# Patient Record
Sex: Female | Born: 1975 | Race: White | Hispanic: No | Marital: Married | Smoking: Never smoker
Health system: Southern US, Community
[De-identification: ages and names within clinical notes are randomized; demographics above are authoritative.]

## PROBLEM LIST (undated history)

## (undated) DIAGNOSIS — I10 Essential (primary) hypertension: Secondary | ICD-10-CM

## (undated) DIAGNOSIS — Z789 Other specified health status: Secondary | ICD-10-CM

## (undated) DIAGNOSIS — G709 Myoneural disorder, unspecified: Secondary | ICD-10-CM

## (undated) DIAGNOSIS — F419 Anxiety disorder, unspecified: Secondary | ICD-10-CM

## (undated) DIAGNOSIS — F4310 Post-traumatic stress disorder, unspecified: Secondary | ICD-10-CM

## (undated) DIAGNOSIS — T7840XA Allergy, unspecified, initial encounter: Secondary | ICD-10-CM

## (undated) DIAGNOSIS — D649 Anemia, unspecified: Secondary | ICD-10-CM

## (undated) DIAGNOSIS — J45909 Unspecified asthma, uncomplicated: Secondary | ICD-10-CM

## (undated) DIAGNOSIS — F32A Depression, unspecified: Secondary | ICD-10-CM

## (undated) DIAGNOSIS — E7800 Pure hypercholesterolemia, unspecified: Secondary | ICD-10-CM

## (undated) DIAGNOSIS — K219 Gastro-esophageal reflux disease without esophagitis: Secondary | ICD-10-CM

## (undated) DIAGNOSIS — M1990 Unspecified osteoarthritis, unspecified site: Secondary | ICD-10-CM

## (undated) DIAGNOSIS — J449 Chronic obstructive pulmonary disease, unspecified: Secondary | ICD-10-CM

## (undated) HISTORY — PX: UPPER GI ENDOSCOPY: SHX6162

## (undated) HISTORY — PX: WISDOM TOOTH EXTRACTION: SHX21

## (undated) HISTORY — DX: Allergy, unspecified, initial encounter: T78.40XA

## (undated) HISTORY — DX: Other specified health status: Z78.9

## (undated) HISTORY — PX: UPPER GASTROINTESTINAL ENDOSCOPY: SHX188

## (undated) HISTORY — DX: Pure hypercholesterolemia, unspecified: E78.00

## (undated) HISTORY — PX: DILATION AND CURETTAGE OF UTERUS: SHX78

## (undated) HISTORY — DX: Post-traumatic stress disorder, unspecified: F43.10

---

## 2016-03-28 DIAGNOSIS — F32A Depression, unspecified: Secondary | ICD-10-CM | POA: Insufficient documentation

## 2016-06-22 LAB — HM PAP SMEAR: HM Pap smear: NEGATIVE

## 2016-10-17 DIAGNOSIS — G43009 Migraine without aura, not intractable, without status migrainosus: Secondary | ICD-10-CM | POA: Insufficient documentation

## 2016-10-17 DIAGNOSIS — I10 Essential (primary) hypertension: Secondary | ICD-10-CM | POA: Insufficient documentation

## 2016-11-28 DIAGNOSIS — M797 Fibromyalgia: Secondary | ICD-10-CM | POA: Insufficient documentation

## 2017-03-19 DIAGNOSIS — K219 Gastro-esophageal reflux disease without esophagitis: Secondary | ICD-10-CM | POA: Insufficient documentation

## 2017-03-19 DIAGNOSIS — K589 Irritable bowel syndrome without diarrhea: Secondary | ICD-10-CM | POA: Insufficient documentation

## 2017-12-03 DIAGNOSIS — R7303 Prediabetes: Secondary | ICD-10-CM | POA: Insufficient documentation

## 2018-04-17 DIAGNOSIS — M1712 Unilateral primary osteoarthritis, left knee: Secondary | ICD-10-CM | POA: Insufficient documentation

## 2018-06-28 DIAGNOSIS — F411 Generalized anxiety disorder: Secondary | ICD-10-CM | POA: Insufficient documentation

## 2018-11-28 DIAGNOSIS — N959 Unspecified menopausal and perimenopausal disorder: Secondary | ICD-10-CM | POA: Insufficient documentation

## 2019-10-10 DIAGNOSIS — M1990 Unspecified osteoarthritis, unspecified site: Secondary | ICD-10-CM | POA: Insufficient documentation

## 2019-10-10 DIAGNOSIS — M0600 Rheumatoid arthritis without rheumatoid factor, unspecified site: Secondary | ICD-10-CM | POA: Insufficient documentation

## 2020-07-22 ENCOUNTER — Ambulatory Visit: Payer: Self-pay | Admitting: Orthopaedic Surgery

## 2020-07-23 ENCOUNTER — Encounter: Payer: Self-pay | Admitting: Surgical

## 2020-07-23 ENCOUNTER — Ambulatory Visit (INDEPENDENT_AMBULATORY_CARE_PROVIDER_SITE_OTHER): Payer: 59

## 2020-07-23 ENCOUNTER — Ambulatory Visit (INDEPENDENT_AMBULATORY_CARE_PROVIDER_SITE_OTHER): Payer: 59 | Admitting: Surgical

## 2020-07-23 ENCOUNTER — Other Ambulatory Visit: Payer: Self-pay

## 2020-07-23 VITALS — Ht 66.7 in | Wt 223.0 lb

## 2020-07-23 DIAGNOSIS — G8929 Other chronic pain: Secondary | ICD-10-CM

## 2020-07-23 DIAGNOSIS — M25562 Pain in left knee: Secondary | ICD-10-CM | POA: Diagnosis not present

## 2020-07-23 DIAGNOSIS — M1712 Unilateral primary osteoarthritis, left knee: Secondary | ICD-10-CM

## 2020-07-24 LAB — PREALBUMIN: Prealbumin: 29 mg/dL (ref 17–34)

## 2020-07-24 LAB — VITAMIN D 25 HYDROXY (VIT D DEFICIENCY, FRACTURES): Vit D, 25-Hydroxy: 34 ng/mL (ref 30–100)

## 2020-07-25 ENCOUNTER — Encounter: Payer: Self-pay | Admitting: Surgical

## 2020-07-25 NOTE — Progress Notes (Signed)
Office Visit Note   Patient: Kristi Decker           Date of Birth: 1976/05/01           MRN: [id number] Visit Date: 07/23/2020 Requested by: No referring provider defined for this encounter. PCP: No primary care provider on file.  Subjective: Chief Complaint  Patient presents with  . Left Knee - Pain    HPI: Kristi Decker is a 45 y.o. female who presents to the office complaining of left knee pain.  Patient has history of left knee pain over the last 3 years.  She has severe pain that is life altering.  She is very tearful regarding her struggles with knee pain in the past at today's visit.  She has tried cortisone injections which initially lasted about 3 months with 100% relief but subsequent injections have provided no lasting benefit.  She has pain that wakes her up at night and limits her walking endurance.  She has gotten to the point where she discussed knee replacement with an orthopedic surgeon at [facility] and had a date for surgery before she was forced to cancel due to change in insurance.  She is here today to discuss left knee replacement.  She has failed physical therapy with no improvement of her symptoms significantly.  No history of gel injections in the past.  She does report that a brace helps somewhat with pain when she is ambulatory.  She denies any groin pain, radicular pain from her low back, numbness/tingling throughout the left leg.  She wakes with pain throughout the night multiple times most nights.  She does have a history of rheumatoid arthritis for which she takes sulfasalazine and diclofenac.  Also reports history of asthma, degenerative disc disease of the lumbar spine, fibromyalgia.  Denies any history of cardiac disease and does not have a cardiologist.  Denies any personal history of blood clots but she does note that her aunt had a blood clot in her 80s.  She does not take any blood thinners.  Denies any history of diabetes.  BMI is about 35.  She lives at home  with her husband and her 34 year old son who will be available to help her.  She has about 2-4 stairs to enter her home based on the entrance.  Everything is on 1 level for the most part..                ROS: All systems reviewed are negative as they relate to the chief complaint within the history of present illness.  Patient denies fevers or chills.  Assessment & Plan: Visit Diagnoses:  1. Unilateral primary osteoarthritis, left knee   2. Chronic pain of left knee     Plan: Patient is a 45 year old female who presents complaining of severe left knee pain.  This left knee pain is life altering. She is young for left total knee arthroplasty at 45 years old but she does have severe left knee osteoarthritis on today's radiographs that show tricompartmental degenerative changes that are worst in the medial compartment with bone-on-bone arthritis along with sclerosis and osteophyte formation.  She has exhausted conservative measures and would like to proceed with surgical intervention.  Discussed the risks and benefits of total knee arthroplasty in great depth with the use of a model.  She understands this is a very painful procedure which requires significant amount of rehabilitation after the surgery.  She does have good help at home and good social support which  bodes well for her.  Risks of the procedure were discussed including risk of medical incident, DVT/PE, knee stiffness, knee instability, nerve/blood vessel damage, prosthetic joint infection, need for revision surgery.  Despite the risks she would like to proceed with surgery.  Plan to post patient for left total knee arthroplasty.  She will follow-up with Dr. August Saucer next week to meet him prior to surgery and ensure there are no other concerns to address prior to her procedure.  Ordered prealbumin and vitamin D to ensure no supplementation is needed.  These labs were found to be normal.  Follow-Up Instructions: No follow-ups on file.   Orders:   Orders Placed This Encounter  Procedures  . XR KNEE 3 VIEW LEFT  . Vitamin D (25 hydroxy)  . Prealbumin   No orders of the defined types were placed in this encounter.     Procedures: No procedures performed   Clinical Data: No additional findings.  Objective: Vital Signs: Ht 5' 6.7" (1.694 m)   Wt 223 lb (101.2 kg)   BMI 35.24 kg/m   Physical Exam:  Constitutional: Patient appears well-developed HEENT:  Head: Normocephalic Eyes:EOM are normal Neck: Normal range of motion Cardiovascular: Normal rate Pulmonary/chest: Effort normal Neurologic: Patient is alert Skin: Skin is warm Psychiatric: Patient has normal mood and affect  Ortho Exam: Ortho exam demonstrates tenderness over the quad tendon, medial joint line, lateral joint line.  No tenderness over the patella or patellar tendon.  Able to perform straight leg raise.  No pain with hip range of motion.  Palpable pedal pulses.  No calf tenderness.  Negative Homans' sign.  No significant lower extremity edema.  Extension to <5 degrees and flexion to 110 degrees.  No instability to varus/valgus stress.  No effusion noted.  Specialty Comments:  No specialty comments available.  Imaging: No results found.   PMFS History: There are no problems to display for this patient.  History reviewed. No pertinent past medical history.  History reviewed. No pertinent family history.  History reviewed. No pertinent surgical history. Social History   Occupational History  . Not on file  Tobacco Use  . Smoking status: Never Smoker  . Smokeless tobacco: Never Used  Substance and Sexual Activity  . Alcohol use: Not on file  . Drug use: Not on file  . Sexual activity: Not on file

## 2020-07-29 ENCOUNTER — Other Ambulatory Visit: Payer: Self-pay

## 2020-07-29 ENCOUNTER — Ambulatory Visit (INDEPENDENT_AMBULATORY_CARE_PROVIDER_SITE_OTHER): Payer: 59 | Admitting: Orthopedic Surgery

## 2020-07-29 ENCOUNTER — Encounter: Payer: Self-pay | Admitting: Orthopedic Surgery

## 2020-07-29 DIAGNOSIS — M1712 Unilateral primary osteoarthritis, left knee: Secondary | ICD-10-CM

## 2020-07-29 NOTE — Progress Notes (Signed)
Office Visit Note   Patient: Kristi Decker           Date of Birth: 05-15-1976           MRN: [id number] Visit Date: 07/29/2020 Requested by: No referring provider defined for this encounter. PCP: No primary care provider on file.  Subjective: Chief Complaint  Patient presents with  . Left Knee - Follow-up    HPI: Kristi Decker is a patient with left knee arthritis symptoms and essentially some knee pain prior to surgery scheduled for 08/17/2020.  She has left knee pain and has been having it for many years.  The pain is becoming intolerable.  She has night pain rest pain pain which interferes with her activities of daily living.  She tried multiple interventions.  None of them effective.  Was scheduled for total knee replacement in [place] but presents now for further management because [place] the city.  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  No personal or family history of DVT or pulmonary embolism              ROS: All systems reviewed are negative as they relate to the chief complaint within the history of present illness.  Patient denies  fevers or chills.   Assessment & Plan: Visit Diagnoses:  1. Unilateral primary osteoarthritis, left knee     Plan: Impression is left knee arthritis end-stage in a 45 year old patient with debilitating symptoms and failure of conservative management.  Plan is left total knee replacement.  Plan press-fit prosthesis if possible.  Risk benefits are discussed include not limited to infection nerve vessel damage knee stiffness as well as potential need for revision in her lifetime.  All questions answered essentially her visit today was more of a meet and greet.  She was posted last visit and therefore the visit today is no charge.  Follow-Up Instructions: No follow-ups on file.   Orders:  No orders of the defined types were placed in this encounter.  No orders of the defined types were placed in this  encounter.     Procedures: No procedures performed   Clinical Data: No additional findings.  Objective: Vital Signs: There were no vitals taken for this visit.  Physical Exam:   Constitutional: Patient appears well-developed HEENT:  Head: Normocephalic Eyes:EOM are normal Neck: Normal range of motion Cardiovascular: Normal rate Pulmonary/chest: Effort normal Neurologic: Patient is alert Skin: Skin is warm Psychiatric: Patient has normal mood and affect    Ortho Exam: Ortho exam demonstrates palpable pedal pulses on the left.  Range of motion is full extension to about 100 of flexion.  Collaterals and cruciate ligaments are stable.  Skin is intact.  Extensor mechanism is intact.  No effusion present.  Global knee pain is present.  I would also like her to stop her Voltaren and sulfasalazine 1 week prior to surgery.  Specialty Comments:  No specialty comments available.  Imaging: No results found.   PMFS History: There are no problems to display for this patient.  History reviewed. No pertinent past medical history.  History reviewed. No pertinent family history.  History reviewed. No pertinent surgical history. Social History   Occupational History  . Not on file  Tobacco Use  . Smoking status: Never Smoker  . Smokeless tobacco: Never Used  Substance and Sexual Activity  . Alcohol use: Not on file  . Drug use: Not on file  . Sexual activity:  Not on file

## 2020-07-30 ENCOUNTER — Telehealth: Payer: Self-pay

## 2020-07-30 ENCOUNTER — Telehealth: Payer: Self-pay | Admitting: Orthopedic Surgery

## 2020-07-30 NOTE — Telephone Encounter (Signed)
Can you advise? 

## 2020-07-30 NOTE — Telephone Encounter (Signed)
Pt wants to know if you have any recommendations for menopausal symptoms. She has cold sweats, then hot flashes, she has not had a period in over a year. Her symotoms started 1 week ago. She feels like someone has lit a fire inside her body for about 20-30 seconds or longer sometimes. She has a new patient appointment scheduled 4/22.

## 2020-07-30 NOTE — Telephone Encounter (Signed)
Patient informed and advised to speak to her obgyn.

## 2020-07-30 NOTE — Telephone Encounter (Signed)
Pt called and she can't remember what medications Dr.Dean told her to stop taking so can you call her or message her through MyChart and let her know which ones it was and also how long before surgery should she stop taking them?

## 2020-07-30 NOTE — Telephone Encounter (Signed)
Unfortunately I will not be able to advise her until her new patient appointment.

## 2020-07-31 ENCOUNTER — Encounter: Payer: Self-pay | Admitting: Orthopedic Surgery

## 2020-08-01 ENCOUNTER — Encounter: Payer: Self-pay | Admitting: Orthopedic Surgery

## 2020-08-01 NOTE — Telephone Encounter (Signed)
Voltaren and sulfasalazine

## 2020-08-02 NOTE — Telephone Encounter (Signed)
10 days thx

## 2020-08-02 NOTE — Telephone Encounter (Signed)
Got it thx

## 2020-08-02 NOTE — Telephone Encounter (Signed)
thanks

## 2020-08-03 NOTE — Telephone Encounter (Signed)
I would stop diclofenac one week prior to surgery and stop sulfa now

## 2020-08-05 ENCOUNTER — Other Ambulatory Visit: Payer: Self-pay

## 2020-08-12 NOTE — Progress Notes (Signed)
Surgical Instructions    Your procedure is scheduled on Tuesday, April 5th, 2022  Report to [facility] Main Entrance "A" at 09:37 A.M., then check in with the Admitting office.  Call this number if you have problems the morning of surgery:  [phone]   If you have any questions prior to your surgery date call [phone]: Open Monday-Friday 8am-4pm    Remember:  Do not eat after midnight the night before your surgery  You may drink clear liquids until 08:37 A.M. the morning of your surgery.   Clear liquids allowed are: Water, Non-Citrus Juices (without pulp), Carbonated Beverages, Clear Tea, Black Coffee Only, and Gatorade   Patient Instructions  . The night before surgery:  o No food after midnight. ONLY clear liquids after midnight  . The day of surgery (if you do NOT have diabetes):  o Drink ONE (1) Pre-Surgery Clear Ensure by 08:37A.M. the morning of surgery. Drink in one sitting. Do not sip.  o This drink was given to you during your hospital  pre-op appointment visit.  o Nothing else to drink after completing the  Pre-Surgery Clear Ensure.         If you have questions, please contact your surgeon's office.   Take these medicines the morning of surgery with A SIP OF WATER   DULoxetine (CYMBALTA) albuterol (PROVENTIL)  - please, bring the inhaler with you pantoprazole (PROTONIX) fenofibrate (TRICOR) QUEtiapine (SEROQUEL)  As of today, STOP taking any Aspirin (unless otherwise instructed by your surgeon) Aleve, Naproxen, Ibuprofen, Motrin, Advil, Goody's, BC's, all herbal medications, fish oil, and all vitamins.                     Do not wear jewelry, make up, or nail polish            Do not wear lotions, powders, perfumes, or deodorant.            Do not shave 48 hours prior to surgery.             Do not bring valuables to the hospital.            [facility] is not responsible for any belongings or valuables.  Do NOT Smoke (Tobacco/Vaping) or drink Alcohol  24 hours prior to your procedure If you use a CPAP at night, you may bring all equipment for your overnight stay.   Contacts, glasses, dentures or bridgework may not be worn into surgery, please bring cases for these belongings   For patients admitted to the hospital, discharge time will be determined by your treatment team.   Patients discharged the day of surgery will not be allowed to drive home, and someone needs to stay with them for 24 hours.    Special instructions:   [facility]- Preparing For Surgery  Before surgery, you can play an important role. Because skin is not sterile, your skin needs to be as free of germs as possible. You can reduce the number of germs on your skin by washing with CHG (chlorahexidine gluconate) Soap before surgery.  CHG is an antiseptic cleaner which kills germs and bonds with the skin to continue killing germs even after washing.    Oral Hygiene is also important to reduce your risk of infection.  Remember - BRUSH YOUR TEETH THE MORNING OF SURGERY WITH YOUR REGULAR TOOTHPASTE  Please do not use if you have an allergy to CHG or antibacterial soaps. If your skin becomes reddened/irritated stop using  the CHG.  Do not shave (including legs and underarms) for at least 48 hours prior to first CHG shower. It is OK to shave your face.  Please follow these instructions carefully.   1. Shower the NIGHT BEFORE SURGERY and the MORNING OF SURGERY  2. If you chose to wash your hair, wash your hair first as usual with your normal shampoo.  3. After you shampoo, rinse your hair and body thoroughly to remove the shampoo.   4. Use CHG Soap as you would any other liquid soap. You can apply CHG directly to the skin and wash gently with a scrungie or a clean washcloth.   5. Apply the CHG Soap to your body ONLY FROM THE NECK DOWN.  Do not use on open wounds or open sores. Avoid contact with your eyes, ears, mouth and genitals (private parts). Wash Face and genitals  (private parts)  with your normal soap.   6. Wash thoroughly, paying special attention to the area where your surgery will be performed.  7. Thoroughly rinse your body with warm water from the neck down.  8. DO NOT shower/wash with your normal soap after using and rinsing off the CHG Soap.  9. Pat yourself dry with a CLEAN TOWEL.  10. Wear CLEAN PAJAMAS to bed the night before surgery  11. Place CLEAN SHEETS on your bed the night before your surgery  12. DO NOT SLEEP WITH PETS.   Day of Surgery: Shower with CHG soap. Wear Clean/Comfortable clothing the morning of surgery Do not apply any deodorants/lotions.   Remember to brush your teeth WITH YOUR REGULAR TOOTHPASTE.   Please read over the following fact sheets that you were given.

## 2020-08-13 ENCOUNTER — Other Ambulatory Visit: Payer: Self-pay

## 2020-08-13 ENCOUNTER — Other Ambulatory Visit (HOSPITAL_COMMUNITY): Payer: 59

## 2020-08-13 ENCOUNTER — Encounter (HOSPITAL_COMMUNITY): Payer: Self-pay

## 2020-08-13 ENCOUNTER — Encounter (HOSPITAL_COMMUNITY)
Admission: RE | Admit: 2020-08-13 | Discharge: 2020-08-13 | Disposition: A | Discharge status: Home / Self Care | Payer: 59 | Source: Ambulatory Visit | Attending: Orthopedic Surgery | Admitting: Orthopedic Surgery

## 2020-08-13 DIAGNOSIS — Z20822 Contact with and (suspected) exposure to covid-19: Secondary | ICD-10-CM | POA: Insufficient documentation

## 2020-08-13 DIAGNOSIS — Z01812 Encounter for preprocedural laboratory examination: Secondary | ICD-10-CM | POA: Insufficient documentation

## 2020-08-13 HISTORY — DX: Unspecified asthma, uncomplicated: J45.909

## 2020-08-13 HISTORY — DX: Anemia, unspecified: D64.9

## 2020-08-13 HISTORY — DX: Myoneural disorder, unspecified: G70.9

## 2020-08-13 HISTORY — DX: Chronic obstructive pulmonary disease, unspecified: J44.9

## 2020-08-13 HISTORY — DX: Anxiety disorder, unspecified: F41.9

## 2020-08-13 HISTORY — DX: Gastro-esophageal reflux disease without esophagitis: K21.9

## 2020-08-13 HISTORY — DX: Essential (primary) hypertension: I10

## 2020-08-13 HISTORY — DX: Depression, unspecified: F32.A

## 2020-08-13 HISTORY — DX: Unspecified osteoarthritis, unspecified site: M19.90

## 2020-08-13 LAB — CBC
HCT: 42 % (ref 36.0–46.0)
Hemoglobin: 13.7 g/dL (ref 12.0–15.0)
MCH: 30 pg (ref 26.0–34.0)
MCHC: 32.6 g/dL (ref 30.0–36.0)
MCV: 92.1 fL (ref 80.0–100.0)
Platelets: 467 10*3/uL — ABNORMAL HIGH (ref 150–400)
RBC: 4.56 MIL/uL (ref 3.87–5.11)
RDW: 13.1 % (ref 11.5–15.5)
WBC: 8.5 10*3/uL (ref 4.0–10.5)
nRBC: 0 % (ref 0.0–0.2)

## 2020-08-13 LAB — URINALYSIS, ROUTINE W REFLEX MICROSCOPIC
Bilirubin Urine: NEGATIVE
Glucose, UA: NEGATIVE mg/dL
Hgb urine dipstick: NEGATIVE
Ketones, ur: NEGATIVE mg/dL
Leukocytes,Ua: NEGATIVE
Nitrite: NEGATIVE
Protein, ur: NEGATIVE mg/dL
Specific Gravity, Urine: 1.031 — ABNORMAL HIGH (ref 1.005–1.030)
pH: 5 (ref 5.0–8.0)

## 2020-08-13 LAB — BASIC METABOLIC PANEL
Anion gap: 9 (ref 5–15)
BUN: 18 mg/dL (ref 6–20)
CO2: 27 mmol/L (ref 22–32)
Calcium: 10.3 mg/dL (ref 8.9–10.3)
Chloride: 101 mmol/L (ref 98–111)
Creatinine, Ser: 0.64 mg/dL (ref 0.44–1.00)
GFR, Estimated: >60 mL/min (ref >60)
Glucose, Bld: 106 mg/dL — ABNORMAL HIGH (ref 70–99)
Potassium: 3.4 mmol/L — ABNORMAL LOW (ref 3.5–5.1)
Sodium: 137 mmol/L (ref 135–145)

## 2020-08-13 LAB — SURGICAL PCR SCREEN
MRSA, PCR: NEGATIVE
Staphylococcus aureus: NEGATIVE

## 2020-08-13 LAB — SARS CORONAVIRUS 2 (TAT 6-24 HRS): SARS Coronavirus 2: NEGATIVE

## 2020-08-13 LAB — NO BLOOD PRODUCTS

## 2020-08-13 NOTE — Progress Notes (Addendum)
Blood refusal sheet faxed to blood bank and added to chart in FYI section. Office notified as well.

## 2020-08-13 NOTE — Progress Notes (Signed)
PCP - Hetty Blend, NP-C (has not seen yet) Cardiologist - denies  **records requested from [facility])  PPM/ICD - denies  Chest x-ray - n/a EKG - 02/04/20 (records requested) Stress Test - denies ECHO - denies Cardiac Cath - denies  Sleep Study - denies  Patient instructed to hold all Aspirin, NSAID's, herbal medications, fish oil and vitamins 7 days prior to surgery.   ERAS Protcol -yes PRE-SURGERY Ensure or G2- ensure give  COVID TEST- 08/13/20   Anesthesia review: yes  Patient denies shortness of breath, fever, cough and chest pain at PAT appointment   All instructions explained to the patient, with a verbal understanding of the material. Patient agrees to go over the instructions while at home for a better understanding. Patient also instructed to self quarantine after being tested for COVID-19. The opportunity to ask questions was provided.

## 2020-08-13 NOTE — Progress Notes (Addendum)
Surgical Instructions    Your procedure is scheduled on Tuesday, April 5th, 2022  Report to [facility] Main Entrance "A" at 09:30 A.M., then check in with the Admitting office.  Call this number if you have problems the morning of surgery:  [phone]   If you have any questions prior to your surgery date call [phone]: Open Monday-Friday 8am-4pm    Remember:  Do not eat after midnight the night before your surgery  You may drink clear liquids until 08:30 A.M. the morning of your surgery.   Clear liquids allowed are: Water, Non-Citrus Juices (without pulp), Carbonated Beverages, Clear Tea, Black Coffee Only, and Gatorade   Patient Instructions  . The night before surgery:  o No food after midnight. ONLY clear liquids after midnight  . The day of surgery (if you do NOT have diabetes):  o Drink ONE (1) Pre-Surgery Clear Ensure by 08:30A.M. the morning of surgery. Drink in one sitting. Do not sip.  o This drink was given to you during your hospital  pre-op appointment visit. o Nothing else to drink after completing the  Pre-Surgery Clear Ensure.         If you have questions, please contact your surgeon's office.   Take these medicines the morning of surgery with A SIP OF WATER  clonazePAM (KLONOPIN) if needed DULoxetine (CYMBALTA) albuterol (PROVENTIL)  - please, bring the inhaler with you pantoprazole (PROTONIX) lamoTRIgine (LAMICTAL)   As of today, STOP taking any Aspirin (unless otherwise instructed by your surgeon) Aleve, Naproxen, Ibuprofen, Motrin, Advil, Goody's, BC's, all herbal medications, fish oil, and all vitamins. Also, stop taking diclofenac (VOLTAREN) and sulfaSALAzine (AZULFIDINE).                     Do not wear jewelry, make up, or nail polish            Do not wear lotions, powders, perfumes, or deodorant.            Do not shave 48 hours prior to surgery.             Do not bring valuables to the hospital.            [facility] is not responsible  for any belongings or valuables.  Do NOT Smoke (Tobacco/Vaping) or drink Alcohol 24 hours prior to your procedure If you use a CPAP at night, you may bring all equipment for your overnight stay.   Contacts, glasses, dentures or bridgework may not be worn into surgery, please bring cases for these belongings   For patients admitted to the hospital, discharge time will be determined by your treatment team.   Patients discharged the day of surgery will not be allowed to drive home, and someone needs to stay with them for 24 hours.    Special instructions:   [facility]- Preparing For Surgery  Before surgery, you can play an important role. Because skin is not sterile, your skin needs to be as free of germs as possible. You can reduce the number of germs on your skin by washing with CHG (chlorahexidine gluconate) Soap before surgery.  CHG is an antiseptic cleaner which kills germs and bonds with the skin to continue killing germs even after washing.    Oral Hygiene is also important to reduce your risk of infection.  Remember - BRUSH YOUR TEETH THE MORNING OF SURGERY WITH YOUR REGULAR TOOTHPASTE  Please do not use if you have an allergy to CHG or  antibacterial soaps. If your skin becomes reddened/irritated stop using the CHG.  Do not shave (including legs and underarms) for at least 48 hours prior to first CHG shower. It is OK to shave your face.  Please follow these instructions carefully.   1. Shower the NIGHT BEFORE SURGERY and the MORNING OF SURGERY  2. If you chose to wash your hair, wash your hair first as usual with your normal shampoo.  3. After you shampoo, rinse your hair and body thoroughly to remove the shampoo.   4. Use CHG Soap as you would any other liquid soap. You can apply CHG directly to the skin and wash gently with a scrungie or a clean washcloth.   5. Apply the CHG Soap to your body ONLY FROM THE NECK DOWN.  Do not use on open wounds or open sores. Avoid contact  with your eyes, ears, mouth and genitals (private parts). Wash Face and genitals (private parts)  with your normal soap.   6. Wash thoroughly, paying special attention to the area where your surgery will be performed.  7. Thoroughly rinse your body with warm water from the neck down.  8. DO NOT shower/wash with your normal soap after using and rinsing off the CHG Soap.  9. Pat yourself dry with a CLEAN TOWEL.  10. Wear CLEAN PAJAMAS to bed the night before surgery  11. Place CLEAN SHEETS on your bed the night before your surgery  12. DO NOT SLEEP WITH PETS.   Day of Surgery: Shower with CHG soap. Wear Clean/Comfortable clothing the morning of surgery Do not apply any deodorants/lotions.   Remember to brush your teeth WITH YOUR REGULAR TOOTHPASTE.   Please read over the following fact sheets that you were given.

## 2020-08-16 ENCOUNTER — Telehealth: Payer: Self-pay | Admitting: Orthopedic Surgery

## 2020-08-16 ENCOUNTER — Encounter: Payer: Self-pay | Admitting: Orthopedic Surgery

## 2020-08-16 LAB — URINE CULTURE

## 2020-08-16 MED ORDER — TRANEXAMIC ACID 1000 MG/10ML IV SOLN
2000.0000 mg | INTRAVENOUS | Status: AC
  Administered 2020-08-17: 2000 mg via TOPICAL
  Filled 2020-08-16: qty 20

## 2020-08-16 NOTE — Telephone Encounter (Signed)
FYI

## 2020-08-16 NOTE — Telephone Encounter (Signed)
Sierra with [facility] pre-admissions left a voicemail message on Friday April 1st at 3:23 wanting to notify you of patient's BLOOD PRODUCTS REFUSAL.  Patient is scheduled for left total knee on 08-17-20 at [facility] with Dr. August Saucer.  FYI has been put in the chart.

## 2020-08-17 ENCOUNTER — Observation Stay (HOSPITAL_COMMUNITY)
Admission: RE | Admit: 2020-08-17 | Discharge: 2020-08-19 | Disposition: A | Discharge status: Home / Self Care | Payer: 59 | Attending: Orthopedic Surgery | Admitting: Orthopedic Surgery

## 2020-08-17 ENCOUNTER — Ambulatory Visit (HOSPITAL_COMMUNITY): Payer: 59 | Admitting: Anesthesiology

## 2020-08-17 ENCOUNTER — Other Ambulatory Visit: Payer: Self-pay

## 2020-08-17 ENCOUNTER — Encounter (HOSPITAL_COMMUNITY)
Admission: RE | Disposition: A | Discharge status: Home / Self Care | Payer: Self-pay | Source: Home / Self Care | Attending: Orthopedic Surgery

## 2020-08-17 ENCOUNTER — Telehealth: Payer: Self-pay | Admitting: Emergency Medicine

## 2020-08-17 ENCOUNTER — Ambulatory Visit (HOSPITAL_COMMUNITY): Payer: 59 | Admitting: Vascular Surgery

## 2020-08-17 ENCOUNTER — Encounter (HOSPITAL_COMMUNITY): Payer: Self-pay | Admitting: Orthopedic Surgery

## 2020-08-17 DIAGNOSIS — M1712 Unilateral primary osteoarthritis, left knee: Principal | ICD-10-CM | POA: Insufficient documentation

## 2020-08-17 DIAGNOSIS — Z79899 Other long term (current) drug therapy: Secondary | ICD-10-CM | POA: Diagnosis not present

## 2020-08-17 DIAGNOSIS — I10 Essential (primary) hypertension: Secondary | ICD-10-CM | POA: Insufficient documentation

## 2020-08-17 DIAGNOSIS — J449 Chronic obstructive pulmonary disease, unspecified: Secondary | ICD-10-CM | POA: Insufficient documentation

## 2020-08-17 DIAGNOSIS — Z96652 Presence of left artificial knee joint: Secondary | ICD-10-CM

## 2020-08-17 DIAGNOSIS — J45909 Unspecified asthma, uncomplicated: Secondary | ICD-10-CM | POA: Diagnosis not present

## 2020-08-17 HISTORY — PX: TOTAL KNEE ARTHROPLASTY: SHX125

## 2020-08-17 SURGERY — ARTHROPLASTY, KNEE, TOTAL
Anesthesia: Monitor Anesthesia Care | Site: Knee | Laterality: Left

## 2020-08-17 MED ORDER — ONDANSETRON HCL 4 MG PO TABS: 4.0000 mg | ORAL_TABLET | Freq: Four times a day (QID) | ORAL | Status: DC | PRN

## 2020-08-17 MED ORDER — METHOCARBAMOL 1000 MG/10ML IJ SOLN
500.0000 mg | Freq: Four times a day (QID) | INTRAVENOUS | Status: DC | PRN
  Administered 2020-08-18: 500 mg via INTRAVENOUS
  Filled 2020-08-17 (×2): qty 5

## 2020-08-17 MED ORDER — TRANEXAMIC ACID-NACL 1000-0.7 MG/100ML-% IV SOLN
1000.0000 mg | INTRAVENOUS | Status: AC
  Administered 2020-08-17: 1000 mg via INTRAVENOUS
  Filled 2020-08-17: qty 100

## 2020-08-17 MED ORDER — DEXAMETHASONE SODIUM PHOSPHATE 10 MG/ML IJ SOLN
INTRAMUSCULAR | Status: DC | PRN
  Administered 2020-08-17: 10 mg

## 2020-08-17 MED ORDER — FENTANYL CITRATE (PF) 100 MCG/2ML IJ SOLN: 25.0000 ug | INTRAMUSCULAR | Status: DC | PRN

## 2020-08-17 MED ORDER — ROPIVACAINE HCL 7.5 MG/ML IJ SOLN
INTRAMUSCULAR | Status: DC | PRN
  Administered 2020-08-17: 25 mL via PERINEURAL

## 2020-08-17 MED ORDER — ALBUTEROL SULFATE HFA 108 (90 BASE) MCG/ACT IN AERS
2.0000 | INHALATION_SPRAY | Freq: Four times a day (QID) | RESPIRATORY_TRACT | Status: DC | PRN
  Filled 2020-08-17: qty 6.7

## 2020-08-17 MED ORDER — OXYCODONE HCL 5 MG PO TABS
5.0000 mg | ORAL_TABLET | Freq: Once | ORAL | Status: DC | PRN
Start: 2020-08-17 — End: 2020-08-17

## 2020-08-17 MED ORDER — POVIDONE-IODINE 10 % EX SWAB: 2.0000 "application " | Freq: Once | CUTANEOUS | Status: DC

## 2020-08-17 MED ORDER — FENTANYL CITRATE (PF) 100 MCG/2ML IJ SOLN
INTRAMUSCULAR | Status: DC | PRN
  Administered 2020-08-17: 50 ug via INTRAVENOUS

## 2020-08-17 MED ORDER — ACETAMINOPHEN 160 MG/5ML PO SOLN: 325.0000 mg | ORAL | Status: DC | PRN

## 2020-08-17 MED ORDER — LIDOCAINE 2% (20 MG/ML) 5 ML SYRINGE
INTRAMUSCULAR | Status: AC
  Filled 2020-08-17: qty 5

## 2020-08-17 MED ORDER — LISINOPRIL 20 MG PO TABS
20.0000 mg | ORAL_TABLET | Freq: Every day | ORAL | Status: DC
  Administered 2020-08-17 – 2020-08-19 (×3): 20 mg via ORAL
  Filled 2020-08-17 (×3): qty 1

## 2020-08-17 MED ORDER — VANCOMYCIN HCL 1000 MG IV SOLR
INTRAVENOUS | Status: DC | PRN
  Administered 2020-08-17: 1000 mg via TOPICAL

## 2020-08-17 MED ORDER — DIPHENHYDRAMINE HCL 50 MG/ML IJ SOLN
INTRAMUSCULAR | Status: AC
  Filled 2020-08-17: qty 1

## 2020-08-17 MED ORDER — ONDANSETRON HCL 4 MG/2ML IJ SOLN: 4.0000 mg | Freq: Once | INTRAMUSCULAR | Status: DC | PRN

## 2020-08-17 MED ORDER — MORPHINE SULFATE (PF) 4 MG/ML IV SOLN
INTRAVENOUS | Status: AC
  Filled 2020-08-17: qty 2

## 2020-08-17 MED ORDER — CHLORHEXIDINE GLUCONATE 0.12 % MT SOLN
15.0000 mL | Freq: Once | OROMUCOSAL | Status: AC
  Administered 2020-08-17: 15 mL via OROMUCOSAL
  Filled 2020-08-17: qty 15

## 2020-08-17 MED ORDER — PROPOFOL 10 MG/ML IV BOLUS
INTRAVENOUS | Status: DC | PRN
  Administered 2020-08-17 (×3): 50 mg via INTRAVENOUS

## 2020-08-17 MED ORDER — FENTANYL CITRATE (PF) 250 MCG/5ML IJ SOLN
INTRAMUSCULAR | Status: AC
  Filled 2020-08-17: qty 5

## 2020-08-17 MED ORDER — MIDAZOLAM HCL 5 MG/5ML IJ SOLN
INTRAMUSCULAR | Status: DC | PRN
  Administered 2020-08-17: 2 mg via INTRAVENOUS

## 2020-08-17 MED ORDER — DIPHENHYDRAMINE HCL 50 MG/ML IJ SOLN
INTRAMUSCULAR | Status: DC | PRN
  Administered 2020-08-17: 12.5 mg via INTRAVENOUS

## 2020-08-17 MED ORDER — BUPIVACAINE HCL 0.25 % IJ SOLN
INTRAMUSCULAR | Status: DC | PRN
  Administered 2020-08-17: 10 mL
  Administered 2020-08-17: 20 mL

## 2020-08-17 MED ORDER — ACETAMINOPHEN 325 MG PO TABS
325.0000 mg | ORAL_TABLET | Freq: Four times a day (QID) | ORAL | Status: DC | PRN
  Administered 2020-08-17: 650 mg via ORAL
  Filled 2020-08-17: qty 2

## 2020-08-17 MED ORDER — ORAL CARE MOUTH RINSE: 15.0000 mL | Freq: Once | OROMUCOSAL | Status: AC

## 2020-08-17 MED ORDER — SODIUM CHLORIDE 0.9% FLUSH
INTRAVENOUS | Status: DC | PRN
  Administered 2020-08-17: 20 mL

## 2020-08-17 MED ORDER — BUPIVACAINE HCL (PF) 0.25 % IJ SOLN
INTRAMUSCULAR | Status: AC
  Filled 2020-08-17: qty 30

## 2020-08-17 MED ORDER — MOMETASONE FURO-FORMOTEROL FUM 200-5 MCG/ACT IN AERO
2.0000 | INHALATION_SPRAY | Freq: Two times a day (BID) | RESPIRATORY_TRACT | Status: DC
  Administered 2020-08-17 – 2020-08-19 (×4): 2 via RESPIRATORY_TRACT
  Filled 2020-08-17: qty 8.8

## 2020-08-17 MED ORDER — OXYCODONE HCL 5 MG PO TABS
5.0000 mg | ORAL_TABLET | ORAL | Status: DC | PRN
  Administered 2020-08-17 – 2020-08-18 (×4): 10 mg via ORAL
  Administered 2020-08-18: 5 mg via ORAL
  Administered 2020-08-19 (×2): 10 mg via ORAL
  Filled 2020-08-17 (×4): qty 2
  Filled 2020-08-17: qty 1
  Filled 2020-08-17 (×2): qty 2

## 2020-08-17 MED ORDER — CEFAZOLIN SODIUM-DEXTROSE 2-4 GM/100ML-% IV SOLN
2.0000 g | INTRAVENOUS | Status: AC
  Administered 2020-08-17: 2 g via INTRAVENOUS
  Filled 2020-08-17: qty 100

## 2020-08-17 MED ORDER — DEXAMETHASONE SODIUM PHOSPHATE 10 MG/ML IJ SOLN
INTRAMUSCULAR | Status: DC | PRN
  Administered 2020-08-17: 5 mg via INTRAVENOUS

## 2020-08-17 MED ORDER — ONDANSETRON HCL 4 MG/2ML IJ SOLN
INTRAMUSCULAR | Status: AC
  Filled 2020-08-17: qty 2

## 2020-08-17 MED ORDER — PROPOFOL 10 MG/ML IV BOLUS
INTRAVENOUS | Status: AC
  Filled 2020-08-17: qty 20

## 2020-08-17 MED ORDER — METOCLOPRAMIDE HCL 5 MG/ML IJ SOLN: 5.0000 mg | Freq: Three times a day (TID) | INTRAMUSCULAR | Status: DC | PRN

## 2020-08-17 MED ORDER — DICLOFENAC SODIUM 75 MG PO TBEC
75.0000 mg | DELAYED_RELEASE_TABLET | Freq: Two times a day (BID) | ORAL | Status: DC
  Administered 2020-08-18 – 2020-08-19 (×3): 75 mg via ORAL
  Filled 2020-08-17 (×6): qty 1

## 2020-08-17 MED ORDER — LAMOTRIGINE 25 MG PO TABS
50.0000 mg | ORAL_TABLET | Freq: Every day | ORAL | Status: DC
  Administered 2020-08-18 – 2020-08-19 (×2): 50 mg via ORAL
  Filled 2020-08-17 (×2): qty 2

## 2020-08-17 MED ORDER — FENOFIBRATE 160 MG PO TABS
160.0000 mg | ORAL_TABLET | Freq: Every day | ORAL | Status: DC
  Administered 2020-08-17 – 2020-08-19 (×3): 160 mg via ORAL
  Filled 2020-08-17 (×3): qty 1

## 2020-08-17 MED ORDER — FENTANYL CITRATE (PF) 100 MCG/2ML IJ SOLN
INTRAMUSCULAR | Status: AC
  Administered 2020-08-17: 100 ug via INTRAVENOUS
  Filled 2020-08-17: qty 2

## 2020-08-17 MED ORDER — LACTATED RINGERS IV SOLN: INTRAVENOUS | Status: DC

## 2020-08-17 MED ORDER — DOCUSATE SODIUM 100 MG PO CAPS
100.0000 mg | ORAL_CAPSULE | Freq: Two times a day (BID) | ORAL | Status: DC
  Administered 2020-08-17 – 2020-08-19 (×4): 100 mg via ORAL
  Filled 2020-08-17 (×4): qty 1

## 2020-08-17 MED ORDER — BUPIVACAINE LIPOSOME 1.3 % IJ SUSP
INTRAMUSCULAR | Status: DC | PRN
  Administered 2020-08-17: 20 mL

## 2020-08-17 MED ORDER — SODIUM CHLORIDE 0.9 % IR SOLN
Status: DC | PRN
  Administered 2020-08-17: 3000 mL

## 2020-08-17 MED ORDER — DICLOFENAC SODIUM 75 MG PO TBEC: 75.0000 mg | DELAYED_RELEASE_TABLET | Freq: Two times a day (BID) | ORAL | Status: DC

## 2020-08-17 MED ORDER — DULOXETINE HCL 60 MG PO CPEP
120.0000 mg | ORAL_CAPSULE | Freq: Every day | ORAL | Status: DC
  Administered 2020-08-18 – 2020-08-19 (×2): 120 mg via ORAL
  Filled 2020-08-17 (×2): qty 2

## 2020-08-17 MED ORDER — MIDAZOLAM HCL 2 MG/2ML IJ SOLN
INTRAMUSCULAR | Status: AC
  Filled 2020-08-17: qty 2

## 2020-08-17 MED ORDER — HYDROMORPHONE HCL 1 MG/ML IJ SOLN
0.5000 mg | INTRAMUSCULAR | Status: DC | PRN
  Administered 2020-08-17 – 2020-08-18 (×5): 0.5 mg via INTRAVENOUS
  Filled 2020-08-17 (×5): qty 1

## 2020-08-17 MED ORDER — MIDAZOLAM HCL 2 MG/2ML IJ SOLN: 2.0000 mg | Freq: Once | INTRAMUSCULAR | Status: AC

## 2020-08-17 MED ORDER — HYDROCHLOROTHIAZIDE 12.5 MG PO CAPS
12.5000 mg | ORAL_CAPSULE | Freq: Every day | ORAL | Status: DC
  Administered 2020-08-17 – 2020-08-19 (×3): 12.5 mg via ORAL
  Filled 2020-08-17 (×3): qty 1

## 2020-08-17 MED ORDER — LISINOPRIL-HYDROCHLOROTHIAZIDE 20-12.5 MG PO TABS: 1.0000 | ORAL_TABLET | Freq: Every day | ORAL | Status: DC

## 2020-08-17 MED ORDER — ONDANSETRON HCL 4 MG/2ML IJ SOLN: 4.0000 mg | Freq: Four times a day (QID) | INTRAMUSCULAR | Status: DC | PRN

## 2020-08-17 MED ORDER — MENTHOL 3 MG MT LOZG: 1.0000 | LOZENGE | OROMUCOSAL | Status: DC | PRN

## 2020-08-17 MED ORDER — VANCOMYCIN HCL 1000 MG IV SOLR
INTRAVENOUS | Status: AC
  Filled 2020-08-17: qty 1000

## 2020-08-17 MED ORDER — PANTOPRAZOLE SODIUM 40 MG PO TBEC
40.0000 mg | DELAYED_RELEASE_TABLET | Freq: Every day | ORAL | Status: DC
  Administered 2020-08-18 – 2020-08-19 (×2): 40 mg via ORAL
  Filled 2020-08-17 (×2): qty 1

## 2020-08-17 MED ORDER — MIDAZOLAM HCL 2 MG/2ML IJ SOLN
INTRAMUSCULAR | Status: AC
  Administered 2020-08-17: 2 mg via INTRAVENOUS
  Filled 2020-08-17: qty 2

## 2020-08-17 MED ORDER — BUPIVACAINE LIPOSOME 1.3 % IJ SUSP
INTRAMUSCULAR | Status: AC
  Filled 2020-08-17: qty 20

## 2020-08-17 MED ORDER — PROPOFOL 500 MG/50ML IV EMUL
INTRAVENOUS | Status: DC | PRN
  Administered 2020-08-17: 100 ug/kg/min via INTRAVENOUS

## 2020-08-17 MED ORDER — MEPERIDINE HCL 25 MG/ML IJ SOLN: 6.2500 mg | INTRAMUSCULAR | Status: DC | PRN

## 2020-08-17 MED ORDER — DEXAMETHASONE SODIUM PHOSPHATE 10 MG/ML IJ SOLN
INTRAMUSCULAR | Status: AC
  Filled 2020-08-17: qty 1

## 2020-08-17 MED ORDER — ASPIRIN 81 MG PO CHEW
81.0000 mg | CHEWABLE_TABLET | Freq: Two times a day (BID) | ORAL | Status: DC
  Administered 2020-08-18 – 2020-08-19 (×3): 81 mg via ORAL
  Filled 2020-08-17 (×3): qty 1

## 2020-08-17 MED ORDER — OXYCODONE HCL 5 MG/5ML PO SOLN
5.0000 mg | Freq: Once | ORAL | Status: DC | PRN
Start: 2020-08-17 — End: 2020-08-17

## 2020-08-17 MED ORDER — FENTANYL CITRATE (PF) 100 MCG/2ML IJ SOLN
100.0000 ug | Freq: Once | INTRAMUSCULAR | Status: AC
Start: 2020-08-17 — End: 2020-08-17

## 2020-08-17 MED ORDER — BUPIVACAINE IN DEXTROSE 0.75-8.25 % IT SOLN
INTRATHECAL | Status: DC | PRN
  Administered 2020-08-17: 2 mL via INTRATHECAL

## 2020-08-17 MED ORDER — LIDOCAINE HCL (CARDIAC) PF 100 MG/5ML IV SOSY
PREFILLED_SYRINGE | INTRAVENOUS | Status: DC | PRN
  Administered 2020-08-17: 40 mg via INTRATRACHEAL

## 2020-08-17 MED ORDER — PHENOL 1.4 % MT LIQD: 1.0000 | OROMUCOSAL | Status: DC | PRN

## 2020-08-17 MED ORDER — 0.9 % SODIUM CHLORIDE (POUR BTL) OPTIME
TOPICAL | Status: DC | PRN
  Administered 2020-08-17: 1000 mL
  Administered 2020-08-17: 3000 mL
  Administered 2020-08-17: 1000 mL

## 2020-08-17 MED ORDER — CEFAZOLIN SODIUM-DEXTROSE 2-4 GM/100ML-% IV SOLN
2.0000 g | Freq: Three times a day (TID) | INTRAVENOUS | Status: AC
  Administered 2020-08-17 – 2020-08-18 (×2): 2 g via INTRAVENOUS
  Filled 2020-08-17 (×2): qty 100

## 2020-08-17 MED ORDER — METHOCARBAMOL 500 MG PO TABS
500.0000 mg | ORAL_TABLET | Freq: Four times a day (QID) | ORAL | Status: DC | PRN
  Administered 2020-08-17 – 2020-08-19 (×3): 500 mg via ORAL
  Filled 2020-08-17 (×3): qty 1

## 2020-08-17 MED ORDER — IRRISEPT - 450ML BOTTLE WITH 0.05% CHG IN STERILE WATER, USP 99.95% OPTIME
TOPICAL | Status: DC | PRN
  Administered 2020-08-17: 450 mL

## 2020-08-17 MED ORDER — CLONIDINE HCL (ANALGESIA) 100 MCG/ML EP SOLN
EPIDURAL | Status: AC
  Filled 2020-08-17: qty 10

## 2020-08-17 MED ORDER — POVIDONE-IODINE 7.5 % EX SOLN: Freq: Once | CUTANEOUS | Status: DC

## 2020-08-17 MED ORDER — METOCLOPRAMIDE HCL 5 MG PO TABS: 5.0000 mg | ORAL_TABLET | Freq: Three times a day (TID) | ORAL | Status: DC | PRN

## 2020-08-17 MED ORDER — ONDANSETRON HCL 4 MG/2ML IJ SOLN
INTRAMUSCULAR | Status: DC | PRN
  Administered 2020-08-17: 4 mg via INTRAVENOUS

## 2020-08-17 MED ORDER — CLONIDINE HCL (ANALGESIA) 100 MCG/ML EP SOLN
EPIDURAL | Status: DC | PRN
  Administered 2020-08-17: 1 mL

## 2020-08-17 MED ORDER — MORPHINE SULFATE (PF) 4 MG/ML IV SOLN
INTRAVENOUS | Status: DC | PRN
  Administered 2020-08-17: 8 mg

## 2020-08-17 MED ORDER — ACETAMINOPHEN 325 MG PO TABS: 325.0000 mg | ORAL_TABLET | ORAL | Status: DC | PRN

## 2020-08-17 SURGICAL SUPPLY — 76 items
BAG DECANTER FOR FLEXI CONT (MISCELLANEOUS) ×2 IMPLANT
BANDAGE ESMARK 6X9 LF (GAUZE/BANDAGES/DRESSINGS) ×1 IMPLANT
BASEPLATE TIBIAL SZ2 TRI (Joint) ×1 IMPLANT
BLADE SAG 18X100X1.27 (BLADE) ×2 IMPLANT
BNDG COHESIVE 6X5 TAN STRL LF (GAUZE/BANDAGES/DRESSINGS) ×2 IMPLANT
BNDG ELASTIC 6X15 VLCR STRL LF (GAUZE/BANDAGES/DRESSINGS) ×2 IMPLANT
BNDG ESMARK 6X9 LF (GAUZE/BANDAGES/DRESSINGS) ×2
BOWL SMART MIX CTS (DISPOSABLE) IMPLANT
CNTNR URN SCR LID CUP LEK RST (MISCELLANEOUS) ×1 IMPLANT
COMP FEM CMTLS TRIATH SZ1 LT (Joint) ×2 IMPLANT
COMPONENT FEM CMTLS TRTH SZ1LT (Joint) ×1 IMPLANT
CONT SPEC 4OZ STRL OR WHT (MISCELLANEOUS) ×1
COVER SURGICAL LIGHT HANDLE (MISCELLANEOUS) ×2 IMPLANT
CUFF TOURN SGL QUICK 34 (TOURNIQUET CUFF) ×1
CUFF TOURN SGL QUICK 42 (TOURNIQUET CUFF) IMPLANT
CUFF TRNQT CYL 34X4.125X (TOURNIQUET CUFF) ×1 IMPLANT
DECANTER SPIKE VIAL GLASS SM (MISCELLANEOUS) ×2 IMPLANT
DRAPE INCISE IOBAN 66X45 STRL (DRAPES) IMPLANT
DRAPE ORTHO SPLIT 77X108 STRL (DRAPES) ×3
DRAPE SURG ORHT 6 SPLT 77X108 (DRAPES) ×3 IMPLANT
DRAPE U-SHAPE 47X51 STRL (DRAPES) ×2 IMPLANT
DRSG AQUACEL AG ADV 3.5X14 (GAUZE/BANDAGES/DRESSINGS) ×2 IMPLANT
DURAPREP 26ML APPLICATOR (WOUND CARE) ×4 IMPLANT
ELECT CAUTERY BLADE 6.4 (BLADE) ×2 IMPLANT
ELECT REM PT RETURN 9FT ADLT (ELECTROSURGICAL) ×2
ELECTRODE REM PT RTRN 9FT ADLT (ELECTROSURGICAL) ×1 IMPLANT
GAUZE SPONGE 4X4 12PLY STRL (GAUZE/BANDAGES/DRESSINGS) IMPLANT
GLOVE ECLIPSE 7.0 STRL STRAW (GLOVE) ×2 IMPLANT
GLOVE ECLIPSE 8.0 STRL XLNG CF (GLOVE) ×2 IMPLANT
GLOVE SRG 8 PF TXTR STRL LF DI (GLOVE) ×1 IMPLANT
GLOVE SURG UNDER POLY LF SZ7 (GLOVE) ×2 IMPLANT
GLOVE SURG UNDER POLY LF SZ8 (GLOVE) ×1
GOWN STRL REUS W/ TWL LRG LVL3 (GOWN DISPOSABLE) ×3 IMPLANT
GOWN STRL REUS W/TWL LRG LVL3 (GOWN DISPOSABLE) ×3
HANDPIECE INTERPULSE COAX TIP (DISPOSABLE) ×1
HOOD PEEL AWAY FLYTE STAYCOOL (MISCELLANEOUS) ×8 IMPLANT
IMMOBILIZER KNEE 20 (SOFTGOODS)
IMMOBILIZER KNEE 20 THIGH 36 (SOFTGOODS) IMPLANT
IMMOBILIZER KNEE 22 UNIV (SOFTGOODS) ×2 IMPLANT
IMMOBILIZER KNEE 24 THIGH 36 (MISCELLANEOUS) IMPLANT
IMMOBILIZER KNEE 24 UNIV (MISCELLANEOUS)
INSERT TIB BEAR TRIATH SZ2 11 (Insert) ×2 IMPLANT
KIT BASIN OR (CUSTOM PROCEDURE TRAY) ×2 IMPLANT
KIT TURNOVER KIT B (KITS) ×2 IMPLANT
KNEE PATELLA ASYMMETRIC 10X32 (Knees) ×2 IMPLANT
MANIFOLD NEPTUNE II (INSTRUMENTS) ×2 IMPLANT
NEEDLE 22X1 1/2 (OR ONLY) (NEEDLE) ×4 IMPLANT
NEEDLE SPNL 18GX3.5 QUINCKE PK (NEEDLE) ×2 IMPLANT
NS IRRIG 1000ML POUR BTL (IV SOLUTION) ×10 IMPLANT
PACK TOTAL JOINT (CUSTOM PROCEDURE TRAY) ×2 IMPLANT
PAD ARMBOARD 7.5X6 YLW CONV (MISCELLANEOUS) ×4 IMPLANT
PAD CAST 4YDX4 CTTN HI CHSV (CAST SUPPLIES) IMPLANT
PAD COLD SHLDR UNI WRAP-ON (PAD) ×2
PAD COLD UNI WRAP-ON (PAD) ×1 IMPLANT
PADDING CAST COTTON 4X4 STRL (CAST SUPPLIES)
PADDING CAST COTTON 6X4 STRL (CAST SUPPLIES) ×4 IMPLANT
SET HNDPC FAN SPRY TIP SCT (DISPOSABLE) ×1 IMPLANT
SPONGE LAP 18X18 RF (DISPOSABLE) ×4 IMPLANT
STRIP CLOSURE SKIN 1/2X4 (GAUZE/BANDAGES/DRESSINGS) ×4 IMPLANT
SUCTION FRAZIER HANDLE 10FR (MISCELLANEOUS) ×1
SUCTION TUBE FRAZIER 10FR DISP (MISCELLANEOUS) ×1 IMPLANT
SUT MNCRL AB 3-0 PS2 18 (SUTURE) ×2 IMPLANT
SUT VIC AB 0 CT1 27 (SUTURE) ×3
SUT VIC AB 0 CT1 27XBRD ANBCTR (SUTURE) ×3 IMPLANT
SUT VIC AB 1 CT1 27 (SUTURE) ×10
SUT VIC AB 1 CT1 27XBRD ANBCTR (SUTURE) ×10 IMPLANT
SUT VIC AB 2-0 CT1 27 (SUTURE) ×4
SUT VIC AB 2-0 CT1 TAPERPNT 27 (SUTURE) ×4 IMPLANT
SYR 30ML LL (SYRINGE) ×6 IMPLANT
SYR TB 1ML LUER SLIP (SYRINGE) IMPLANT
TIBIAL BASEPLATE SZ2 TRI (Joint) ×2 IMPLANT
TOWEL GREEN STERILE (TOWEL DISPOSABLE) ×4 IMPLANT
TOWEL GREEN STERILE FF (TOWEL DISPOSABLE) ×4 IMPLANT
TRAY CATH 16FR W/PLASTIC CATH (SET/KITS/TRAYS/PACK) IMPLANT
TRAY FOLEY W/BAG SLVR 14FR (SET/KITS/TRAYS/PACK) ×2 IMPLANT
WATER STERILE IRR 1000ML POUR (IV SOLUTION) IMPLANT

## 2020-08-17 NOTE — Anesthesia Preprocedure Evaluation (Signed)
Anesthesia Evaluation  Patient identified by MRN, date of birth, ID band Patient awake    Reviewed: Allergy & Precautions, H&P , NPO status , Patient's Chart, lab work & pertinent test results, reviewed documented beta blocker date and time   Airway Mallampati: II  TM Distance: >3 FB Neck ROM: full    Dental no notable dental hx.    Pulmonary asthma , COPD,    Pulmonary exam normal breath sounds clear to auscultation       Cardiovascular Exercise Tolerance: Good hypertension, Pt. on medications  Rhythm:regular Rate:Normal     Neuro/Psych PSYCHIATRIC DISORDERS Anxiety Depression  Neuromuscular disease    GI/Hepatic GERD  Medicated and Controlled,  Endo/Other    Renal/GU   negative genitourinary   Musculoskeletal  (+) Arthritis ,   Abdominal   Peds  Hematology  (+) Blood dyscrasia, anemia ,   Anesthesia Other Findings   Reproductive/Obstetrics negative OB ROS                            Anesthesia Physical Anesthesia Plan  ASA: II  Anesthesia Plan: MAC and Spinal   Post-op Pain Management:  Regional for Post-op pain   Induction: Intravenous  PONV Risk Score and Plan: 2  Airway Management Planned: Nasal Cannula and Natural Airway  Additional Equipment: None  Intra-op Plan:   Post-operative Plan:   Informed Consent: I have reviewed the patients History and Physical, chart, labs and discussed the procedure including the risks, benefits and alternatives for the proposed anesthesia with the patient or authorized representative who has indicated his/her understanding and acceptance.     Dental Advisory Given  Plan Discussed with: CRNA and Anesthesiologist  Anesthesia Plan Comments:         Anesthesia Quick Evaluation

## 2020-08-17 NOTE — Transfer of Care (Signed)
Immediate Anesthesia Transfer of Care Note  Patient: Kristi Decker  Procedure(s) Performed: LEFT TOTAL KNEE ARTHROPLASTY (Left Knee)  Patient Location: PACU  Anesthesia Type:MAC and MAC combined with regional for post-op pain  Level of Consciousness: awake, alert , oriented and patient cooperative  Airway & Oxygen Therapy: Patient Spontanous Breathing  Post-op Assessment: Report given to RN and Post -op Vital signs reviewed and stable  Post vital signs: Reviewed and stable  Last Vitals:  Vitals Value Taken Time  BP 124/84 08/17/20 1549  Temp    Pulse 93 08/17/20 1549  Resp 13 08/17/20 1549  SpO2 97 % 08/17/20 1549  Vitals shown include unvalidated device data.  Last Pain:  Vitals:   08/17/20 0921  TempSrc: Oral      Patients Stated Pain Goal: 3 (98/06/99 9672)  Complications: No complications documented.

## 2020-08-17 NOTE — Brief Op Note (Signed)
   08/17/2020  4:03 PM  PATIENT:  Kristi Decker  46 y.o. female  PRE-OPERATIVE DIAGNOSIS:  left knee osteoarthritis  POST-OPERATIVE DIAGNOSIS:  left knee osteoarthritis  PROCEDURE:  Procedure(s): LEFT TOTAL KNEE ARTHROPLASTY  SURGEON:  Surgeon(s): Cammy Copa, MD  ASSISTANT: magnant pa  ANESTHESIA:   spinal  EBL: 75 ml    Total I/O In: 1000 [I.V.:1000] Out: 40 [Blood:40]  BLOOD ADMINISTERED: none  DRAINS: none   LOCAL MEDICATIONS USED: Marcaine morphine Exparel clonidine vancomycin powder SPECIMEN:  No Specimen  COUNTS:  YES  TOURNIQUET:   Total Tourniquet Time Documented: Thigh (Left) - 67 minutes Total: Thigh (Left) - 67 minutes   DICTATION: .Other Dictation: Dictation Number [id number]  PLAN OF CARE: Admit for overnight observation  PATIENT DISPOSITION:  PACU - hemodynamically stable

## 2020-08-17 NOTE — Anesthesia Procedure Notes (Signed)
Anesthesia Regional Block: Adductor canal block   Pre-Anesthetic Checklist: ,, timeout performed, Correct Patient, Correct Site, Correct Laterality, Correct Procedure, Correct Position, site marked, Risks and benefits discussed,  Surgical consent,  Pre-op evaluation,  At surgeon's request and post-op pain management  Laterality: Left  Prep: chloraprep       Needles:  Injection technique: Single-shot  Needle Type: Echogenic Stimulator Needle     Needle Length: 5cm  Needle Gauge: 22     Additional Needles:   Procedures:, nerve stimulator,,, ultrasound used (permanent image in chart),,,,  Narrative:  Start time: 08/17/2020 10:15 AM End time: 08/17/2020 10:20 AM Injection made incrementally with aspirations every 5 mL.  Performed by: Personally  Anesthesiologist: Bethena Midget, MD  Additional Notes: Functioning IV was confirmed and monitors were applied.  A 17mm 22ga Arrow echogenic stimulator needle was used. Sterile prep and drape,hand hygiene and sterile gloves were used. Ultrasound guidance: relevant anatomy identified, needle position confirmed, local anesthetic spread visualized around nerve(s)., vascular puncture avoided.  Image printed for medical record. Negative aspiration and negative test dose prior to incremental administration of local anesthetic. The patient tolerated the procedure well.

## 2020-08-17 NOTE — Evaluation (Signed)
Physical Therapy Evaluation Patient Details Name: Kristi Decker MRN: [id number] DOB: Jul 17, 1975 Today's Date: 08/17/2020   History of Present Illness  Pt adm 4/5 for lt TKR. PMH - copd, chronic back pain, anxiety, depression  Clinical Impression  Pt admitted with above diagnosis and presents to PT with functional limitations due to deficits listed below (See PT problem list). Pt needs skilled PT to maximize independence and safety to allow discharge. Pt plans to return home with family.      Follow Up Recommendations Follow surgeon's recommendation for DC plan and follow-up therapies    Equipment Recommendations  None recommended by PT    Recommendations for Other Services       Precautions / Restrictions Precautions Precautions: Knee;Fall Restrictions Weight Bearing Restrictions: Yes LLE Weight Bearing: Weight bearing as tolerated      Mobility  Bed Mobility Overal bed mobility: Needs Assistance Bed Mobility: Rolling;Sidelying to Sit;Sit to Sidelying Rolling: Mod assist Sidelying to sit: Mod assist;HOB elevated     Sit to sidelying: Mod assist General bed mobility comments: Assist to bring LLE off of bed, elevate trunk into sitting. Assist to bring LLE back up into bed.    Transfers                 General transfer comment: Did not attempt due to LLE not back from spinal yet  Ambulation/Gait                Stairs            Wheelchair Mobility    Modified Rankin (Stroke Patients Only)       Balance                                             Pertinent Vitals/Pain Pain Assessment: Faces Faces Pain Scale: Hurts whole lot Pain Location: lt knee Pain Descriptors / Indicators: Burning Pain Intervention(s): Limited activity within patient's tolerance;Repositioned    Home Living Family/patient expects to be discharged to:: Private residence Living Arrangements: Spouse/significant other Available Help at Discharge:  Family;Available 24 hours/day (mother coming to help and son also there) Type of Home: House Home Access: Stairs to enter Entrance Stairs-Rails: None Entrance Stairs-Number of Steps: 2-4 Home Layout: One level Home Equipment: Environmental consultant - 2 wheels      Prior Function Level of Independence: Independent with assistive device(s)         Comments: Using rolling walker     Hand Dominance        Extremity/Trunk Assessment   Upper Extremity Assessment Upper Extremity Assessment: Overall WFL for tasks assessed    Lower Extremity Assessment Lower Extremity Assessment: LLE deficits/detail LLE Deficits / Details: Limited by pain. Poor quad set. Knee AAROM 0-30 in supine.       Communication   Communication: No difficulties  Cognition Arousal/Alertness: Awake/alert Behavior During Therapy: WFL for tasks assessed/performed Overall Cognitive Status: Within Functional Limits for tasks assessed                                        General Comments      Exercises Total Joint Exercises Heel Slides: AAROM;Left;5 reps;Supine Goniometric ROM: 0-30 in supine   Assessment/Plan    PT Assessment Patient needs continued PT services  PT Problem List Decreased  strength;Decreased range of motion;Decreased activity tolerance;Decreased mobility;Pain       PT Treatment Interventions DME instruction;Gait training;Functional mobility training;Therapeutic activities;Therapeutic exercise;Patient/family education;Stair training    PT Goals (Current goals can be found in the Care Plan section)  Acute Rehab PT Goals Patient Stated Goal: return home PT Goal Formulation: With patient Time For Goal Achievement: 08/24/20 Potential to Achieve Goals: Good    Frequency 7X/week   Barriers to discharge Inaccessible home environment stairs to enter    Co-evaluation               AM-PAC PT "6 Clicks" Mobility  Outcome Measure Help needed turning from your back to your  side while in a flat bed without using bedrails?: A Lot Help needed moving from lying on your back to sitting on the side of a flat bed without using bedrails?: A Lot Help needed moving to and from a bed to a chair (including a wheelchair)?: A Lot Help needed standing up from a chair using your arms (e.g., wheelchair or bedside chair)?: A Lot Help needed to walk in hospital room?: A Lot Help needed climbing 3-5 steps with a railing? : A Lot 6 Click Score: 12    End of Session   Activity Tolerance: Patient limited by pain Patient left: in bed;with call bell/phone within reach;with bed alarm set;with family/visitor present   PT Visit Diagnosis: Other abnormalities of gait and mobility (R26.89);Pain Pain - Right/Left: Left Pain - part of body: Knee    Time: 5056-9794 PT Time Calculation (min) (ACUTE ONLY): 20 min   Charges:   PT Evaluation $PT Eval Moderate Complexity: 1 Mod          [facility] PT Acute Rehabilitation Services Pager [phone] Office [phone]   Angelina Ok [facility] 08/17/2020, 7:22 PM

## 2020-08-17 NOTE — Anesthesia Procedure Notes (Addendum)
Procedure Name: MAC Date/Time: 08/17/2020 1:00 PM Performed by: Michele Rockers, CRNA Pre-anesthesia Checklist: Patient identified, Emergency Drugs available, Suction available, Timeout performed and Patient being monitored Patient Re-evaluated:Patient Re-evaluated prior to induction Oxygen Delivery Method: Simple face mask

## 2020-08-17 NOTE — Telephone Encounter (Signed)
Pt's husband at [facility]  (identifiers confirmed w/ driver's license). Katrina's mother had dropped pt off, but could not recall which hospital patient was at. Call to [facility] PACU, spoke w/ Mardella Layman who confirmed pt wa in OR at [facility]. Directions given to patient's husband, Greig Castilla, for [facility], parking & [employer] entrance.

## 2020-08-17 NOTE — H&P (Addendum)
TOTAL KNEE ADMISSION H&P  Patient is being admitted for left total knee arthroplasty.  Subjective:  Chief Complaint:left knee pain.  HPI: Kristi Decker, 45 y.o. female, has a history of pain and functional disability in the left knee due to arthritis and has failed non-surgical conservative treatments for greater than 12 weeks to includeNSAID's and/or analgesics, corticosteriod injections, viscosupplementation injections and activity modification.  Onset of symptoms was gradual, starting 7 years ago with gradually worsening course since that time. The patient noted no past surgery on the left knee(s).  Patient currently rates pain in the left knee(s) at 8 out of 10 with activity. Patient has night pain, worsening of pain with activity and weight bearing, pain that interferes with activities of daily living, pain with passive range of motion, crepitus and joint swelling.  Patient has evidence of subchondral sclerosis, periarticular osteophytes and joint space narrowing by imaging studies. This patient has had A long history of knee pain which has been refractory to all nonoperative management.  No personal or family history of DVT or pulmonary embolism. There is no active infection.  There are no problems to display for this patient.  Past Medical History:  Diagnosis Date  . Anemia   . Anxiety   . Arthritis    left knee  . Asthma   . COPD (chronic obstructive pulmonary disease) (HCC)   . Depression   . GERD (gastroesophageal reflux disease)   . Hypertension   . Neuromuscular disorder (HCC)    right arm    Past Surgical History:  Procedure Laterality Date  . DILATION AND CURETTAGE OF UTERUS     2002  . UPPER GI ENDOSCOPY    . WISDOM TOOTH EXTRACTION      Current Facility-Administered Medications  Medication Dose Route Frequency Provider Last Rate Last Admin  . ceFAZolin (ANCEF) IVPB 2g/100 mL premix  2 g Intravenous On Call to OR Magnant, Charles L, PA-C      . lactated ringers  infusion   Intravenous Continuous Ellender, Catheryn Bacon, MD      . povidone-iodine (BETADINE) 7.5 % scrub   Topical Once Magnant, Charles L, PA-C      . povidone-iodine 10 % swab 2 application  2 application Topical Once Magnant, Charles L, PA-C      . povidone-iodine 10 % swab 2 application  2 application Topical Once Magnant, Charles L, PA-C      . tranexamic acid (CYKLOKAPRON) 2,000 mg in sodium chloride 0.9 % 50 mL Topical Application  2,000 mg Topical To OR Cammy Copa, MD      . tranexamic acid (CYKLOKAPRON) IVPB 1,000 mg  1,000 mg Intravenous To OR Magnant, Charles L, PA-C       Facility-Administered Medications Ordered in Other Encounters  Medication Dose Route Frequency Provider Last Rate Last Admin  . dexamethasone (DECADRON) injection   Infiltration Anesthesia Intra-op Bethena Midget, MD   10 mg at 08/17/20 1020  . ropivacaine (PF) 7.5 mg/mL (0.75%) (NAROPIN) injection   Peri-NEURAL Anesthesia Intra-op Bethena Midget, MD   25 mL at 08/17/20 1020   No Known Allergies  Social History   Tobacco Use  . Smoking status: Never Smoker  . Smokeless tobacco: Never Used  Substance Use Topics  . Alcohol use: Never    History reviewed. No pertinent family history.   Review of Systems  Musculoskeletal: Positive for arthralgias.  All other systems reviewed and are negative.   Objective:  Physical Exam Vitals reviewed.  HENT:  Head: Normocephalic.     Nose: Nose normal.     Mouth/Throat:     Mouth: Mucous membranes are moist.  Eyes:     Pupils: Pupils are equal, round, and reactive to light.  Cardiovascular:     Rate and Rhythm: Normal rate.     Pulses: Normal pulses.  Pulmonary:     Effort: Pulmonary effort is normal.  Abdominal:     General: Abdomen is flat.  Musculoskeletal:     Cervical back: Normal range of motion.  Skin:    General: Skin is warm.     Capillary Refill: Capillary refill takes less than 2 seconds.  Neurological:     General: No focal deficit  present.     Mental Status: She is alert.  Psychiatric:        Mood and Affect: Mood normal.    Ortho exam demonstrates palpable pedal pulses on the left.  Range of motion is full extension to about 100 of flexion.  Collaterals and cruciate ligaments are stable.  Skin is intact.  Extensor mechanism is intact.  No effusion present.  Global knee pain is present.  I would also like her to stop her Voltaren and sulfasalazine 1 week prior to surgery.  Vital signs in last 24 hours: Temp:  [97.9 F (36.6 C)] 97.9 F (36.6 C) (04/05 0921) Pulse Rate:  [84-100] 93 (04/05 1030) Resp:  [10-18] 12 (04/05 1030) BP: (133-165)/(81-105) 139/84 (04/05 1030) SpO2:  [97 %-100 %] 99 % (04/05 1030) Weight:  [99.3 kg] 99.3 kg (04/05 0921)  Labs:   Estimated body mass index is 35.35 kg/m as calculated from the following:   Height as of this encounter: 5\' 6"  (1.676 m).   Weight as of this encounter: 99.3 kg.   Imaging Review Plain radiographs demonstrate severe degenerative joint disease of the left knee(s). The overall alignment ismild varus. The bone quality appears to be good for age and reported activity level.      Assessment/Plan:  End stage arthritis, left knee   The patient history, physical examination, clinical judgment of the provider and imaging studies are consistent with end stage degenerative joint disease of the left knee(s) and total knee arthroplasty is deemed medically necessary. The treatment options including medical management, injection therapy arthroscopy and arthroplasty were discussed at length. The risks and benefits of total knee arthroplasty were presented and reviewed. The risks due to aseptic loosening, infection, stiffness, patella tracking problems, thromboembolic complications and other imponderables were discussed. The patient acknowledged the explanation, agreed to proceed with the plan and consent was signed. Patient is being admitted for inpatient treatment for  surgery, pain control, PT, OT, prophylactic antibiotics, VTE prophylaxis, progressive ambulation and ADL's and discharge planning. The patient is planning to be discharged home with home health services     Patient's anticipated LOS is less than 2 midnights, meeting these requirements: - Younger than 85 - Lives within 1 hour of care - Has a competent adult at home to recover with post-op recover - NO history of  - Chronic pain requiring opiods  - Diabetes  - Coronary Artery Disease  - Heart failure  - Heart attack  - Stroke  - DVT/VTE  - Cardiac arrhythmia  - Respiratory Failure/COPD  - Renal failure  - Anemia  - Advanced Liver disease

## 2020-08-17 NOTE — Anesthesia Procedure Notes (Signed)
Spinal  Patient location during procedure: OR Start time: 08/17/2020 1:10 PM End time: 08/17/2020 1:14 PM Reason for block: surgical anesthesia Staffing Anesthesiologist: Bethena Midget, MD Preanesthetic Checklist Completed: patient identified, IV checked, site marked, risks and benefits discussed, surgical consent, monitors and equipment checked, pre-op evaluation and timeout performed Spinal Block Patient position: sitting Prep: DuraPrep Patient monitoring: heart rate, cardiac monitor, continuous pulse ox and blood pressure Approach: midline Location: L3-4 Injection technique: single-shot Needle Needle type: Sprotte and Whitacre  Needle gauge: 24 G Needle length: 12.7 cm Assessment Sensory level: T4 Events: CSF return

## 2020-08-18 ENCOUNTER — Encounter: Payer: Self-pay | Admitting: Orthopedic Surgery

## 2020-08-18 ENCOUNTER — Encounter (HOSPITAL_COMMUNITY): Payer: Self-pay | Admitting: Orthopedic Surgery

## 2020-08-18 DIAGNOSIS — M1712 Unilateral primary osteoarthritis, left knee: Secondary | ICD-10-CM | POA: Diagnosis not present

## 2020-08-18 NOTE — Progress Notes (Signed)
Physical Therapy Treatment Patient Details Name: Kristi Decker MRN: [id number] DOB: 1975/07/20 Today's Date: 08/18/2020    History of Present Illness Pt adm 4/5 for lt TKR. PMH - copd, chronic back pain, anxiety, depression    PT Comments    Pt supine in bed on arrival this session.  Pt very anxious and required encouragement to participate in PT session.  Pt continues to progress slowly, wil f/u for pm session.    Follow Up Recommendations  Follow surgeon's recommendation for DC plan and follow-up therapies     Equipment Recommendations  None recommended by PT    Recommendations for Other Services       Precautions / Restrictions Precautions Precautions: Knee;Fall Restrictions Weight Bearing Restrictions: Yes LLE Weight Bearing: Weight bearing as tolerated    Mobility  Bed Mobility Overal bed mobility: Needs Assistance Bed Mobility: Sit to Sidelying;Supine to Sit     Supine to sit: Min assist     General bed mobility comments: Cues for sequencing and safety.  assistance LLE to edge of bed.    Transfers Overall transfer level: Needs assistance Equipment used: Rolling walker (2 wheeled) Transfers: Sit to/from Stand Sit to Stand: Min assist         General transfer comment: Cues for hand and foot placement.  Assistance to boost into standing.  Ambulation/Gait Ambulation/Gait assistance: Min assist Gait Distance (Feet): 6 Feet Assistive device: Rolling walker (2 wheeled) Gait Pattern/deviations: Step-to pattern;Antalgic;Decreased stride length;Decreased stance time - left;Decreased dorsiflexion - left;Trunk flexed     General Gait Details: Performed short bout of gt from bed to recliner.  Cues for sequencing and RW safety.   Stairs             Wheelchair Mobility    Modified Rankin (Stroke Patients Only)       Balance Overall balance assessment: Needs assistance   Sitting balance-Leahy Scale: Fair       Standing balance-Leahy Scale:  Poor Standing balance comment: BUE support.                            Cognition Arousal/Alertness: Awake/alert Behavior During Therapy: Anxious Overall Cognitive Status: Within Functional Limits for tasks assessed                                 General Comments: very anxious      Exercises Total Joint Exercises Ankle Circles/Pumps: AROM;Both;10 reps;Supine Quad Sets: AROM;Left;10 reps;Supine Heel Slides: AAROM;Left;10 reps;Supine Hip ABduction/ADduction: AAROM;Left;10 reps;Supine Knee Flexion: AAROM;Left;10 reps;Supine    General Comments        Pertinent Vitals/Pain Pain Assessment: Faces Faces Pain Scale: Hurts whole lot Pain Location: lt knee Pain Descriptors / Indicators: Burning Pain Intervention(s): Monitored during session;Repositioned;Ice applied    Home Living                      Prior Function            PT Goals (current goals can now be found in the care plan section) Acute Rehab PT Goals Patient Stated Goal: return home Potential to Achieve Goals: Good Progress towards PT goals: Progressing toward goals    Frequency    7X/week      PT Plan Current plan remains appropriate    Co-evaluation              AM-PAC PT "6 Clicks"  Mobility   Outcome Measure  Help needed turning from your back to your side while in a flat bed without using bedrails?: A Lot Help needed moving from lying on your back to sitting on the side of a flat bed without using bedrails?: A Lot Help needed moving to and from a bed to a chair (including a wheelchair)?: A Lot Help needed standing up from a chair using your arms (e.g., wheelchair or bedside chair)?: A Lot Help needed to walk in hospital room?: A Lot Help needed climbing 3-5 steps with a railing? : A Lot 6 Click Score: 12    End of Session Equipment Utilized During Treatment: Gait belt Activity Tolerance: Patient limited by pain Patient left: with call bell/phone  within reach;with family/visitor present;in chair;with chair alarm set Nurse Communication: Mobility status PT Visit Diagnosis: Other abnormalities of gait and mobility (R26.89);Pain Pain - Right/Left: Left Pain - part of body: Knee     Time: 0928-1008 PT Time Calculation (min) (ACUTE ONLY): 40 min  Charges:  $Gait Training: 8-22 mins $Therapeutic Exercise: 8-22 mins $Therapeutic Activity: 8-22 mins                     Bonney Leitz , PTA Acute Rehabilitation Services Pager [phone] Office [phone]     Mubarak Bevens Artis Delay 08/18/2020, 2:26 PM

## 2020-08-18 NOTE — Op Note (Signed)
NAMESAMARIYA, ROCKHOLD MEDICAL RECORD NO: [id number] ACCOUNT NO: [id number] DATE OF BIRTH: 16-Aug-1975 FACILITY: [facility] LOCATION: [facility] PHYSICIAN: Graylin Shiver. August Saucer, MD  Operative Report   DATE OF PROCEDURE: 08/17/2020  PREOPERATIVE DIAGNOSIS:  Left knee arthritis.  POSTOPERATIVE DIAGNOSIS:  Left knee arthritis.  PROCEDURE:  Left total knee replacement using Stryker Triathlon size 1 femur, size 2 tibia, posterior cruciate stabilized, 11 mm polyethylene deep dish insert with 32 mm 3-peg press-fit patella.  SURGEON:  Graylin Shiver. August Saucer, MD.  ASSISTANT:  Karenann Cai, PA and Edgardo Roys, MS3.  INDICATIONS:  This is a 45 year old patient with end-stage left knee arthritis, presents for operative management after explanation of risks and benefits.  DESCRIPTION OF PROCEDURE:  The patient was brought to the operating room where spinal anesthetic was induced.  Preoperative antibiotics administered.  Timeout was called.  Left leg was pre-scrubbed with alcohol and Betadine allowed to air dry.  Prepped  with DuraPrep solution and draped in a sterile manner.  Ioban used to cover the operative field.  The leg was elevated and exsanguinated with the Esmarch wrap.  Tourniquet was inflated.  Timeout was called.  Anterior approach to knee was made.  Skin and  subcutaneous tissue were sharply divided.  Median parapatellar approach was made marked with a #1 Vicryl suture.  The patella was everted.  Severe tricompartmental arthritis was present.  Soft tissue dissection performed moderately on the medial side due  to preoperative varus deformity.  Fat pad was partially excised.  Soft tissue removed from the anterior distal femur.  Lateral patellofemoral ligament was released.  Next, the patella was everted and intramedullary alignment was used to make a cut  perpendicular to the mechanical axis with the posterior neurovascular structures and the collaterals protected.  A 9 mm cut off the least affected lateral  tibial plateau was made.  Next, the intramedullary alignment was used to make a cut off the distal  femur.  An 8 mm resection was made.  Spacer was placed and the 9 mm spacer achieved full extension.  Next, the femur, sized to a size 1.  Anterior, posterior chamfer cuts were made.  The osteophyte was removed from the posterior medial femoral condyle.   Next, the tibial baseplate was placed in the correct rotational alignment.  Femur was then placed, which was size 1.  We reduced her with both the 9 and 11 mm spacer.  She had about 5 degrees of hyperextension with a 9 mm spacer, but with the 11 mm  spacer she had no hyperextension and very good stability to varus and valgus stress at 0, 30 and 90 degrees.  No liftoff with flexion.  Patella was then cut down from 22 to 12 mm and a 32 mm 3-peg patellar trial was placed.  With the trial components in  position the patient achieved full extension, full flexion with excellent patellar tracking, no thumbs technique.  Very good stability to varus and valgus stress at 0, 30 and 90 degrees.  Next, trial components were removed.  Tibial preparation was  completed.  Thorough irrigation was performed with 3 liters of irrigating solution.  Next, the capsule was anesthetized using Exparel, Marcaine, saline solution.  Tranexamic acid sponge was allowed to sit and therefore 3 minutes along with IrriSept  sponge, which was used after the incision and after the arthrotomy.  These were removed.  Vancomycin powder placed into the intramedullary canal of the tibia.  The tibia was then placed with excellent press fit obtained.  Bone quality was excellent.   Femur also had very good press fit.  An 11 mm spacer was placed and the patella was placed.  Very good stability and same range of motion parameters and tracking parameters were maintained.  Tourniquet was released, bleeding points encountered were  controlled using electrocautery.  5 more liters of pouring irrigating solution  were utilized.  The arthrotomy was then closed over bolster using #1 Vicryl suture.  The IrriSept solution, Vancomycin powder placed into the arthrotomy just prior to closure.   Then, a solution of Marcaine, morphine, clonidine injected into the knee for postop pain relief as well.  At that time Vancomycin powder placed on top of the arthrotomy closure followed by interrupted inverted 0 Vicryl suture, 2-0 Vicryl suture, and  3-0 Monocryl.  Steri-Strips and Aquacel dressing applied.  Bulky dressing, IceMan and knee immobilizer were placed.  Luke's assistance was required at all times for retraction, mobilization of tissue, opening and closing.  His assistance was a medical  necessity.  The patient was transferred to recovery room in stable condition.   PUS D: 08/17/2020 4:13:30 pm T: 08/18/2020 2:18:00 am  JOB: [id number]/ [id number]

## 2020-08-18 NOTE — Progress Notes (Signed)
Physical Therapy Treatment Patient Details Name: Kristi Decker MRN: [id number] DOB: 10-17-1975 Today's Date: 08/18/2020    History of Present Illness Pt adm 4/5 for lt TKR. PMH - copd, chronic back pain, anxiety, depression    PT Comments    Pt sleeping soundly on arrival, when encouraged to participate patient started sobbing uncontrollably and required increased time to gain composure.  Pt performed HEP and increased gt distance.  Pt with poor activity tolerance.  Pt also reporting her pain is not well controlled.  Will f/u in am for continued therapies.     Follow Up Recommendations  Follow surgeon's recommendation for DC plan and follow-up therapies     Equipment Recommendations  None recommended by PT    Recommendations for Other Services       Precautions / Restrictions Precautions Precautions: Knee;Fall Precaution Booklet Issued: No Restrictions Weight Bearing Restrictions: Yes LLE Weight Bearing: Weight bearing as tolerated    Mobility  Bed Mobility Overal bed mobility: Needs Assistance Bed Mobility: Supine to Sit;Sit to Supine     Supine to sit: Min assist Sit to supine: Min assist   General bed mobility comments: Pt supine in bed this session.  Pt continues to require min assistance to manage LLE out of bed and back to bed.    Transfers Overall transfer level: Needs assistance Equipment used: Rolling walker (2 wheeled) Transfers: Sit to/from Stand Sit to Stand: Min assist         General transfer comment: Impulsive pulling on RW and standing before gt belt is donned.  Cues for hand placement and LLE advancement forward.  Ambulation/Gait Ambulation/Gait assistance: Min guard Gait Distance (Feet): 20 Feet Assistive device: Rolling walker (2 wheeled) Gait Pattern/deviations: Step-to pattern;Antalgic;Decreased stride length;Decreased stance time - left;Decreased dorsiflexion - left;Trunk flexed     General Gait Details: Performed short bout of gt but  increased distance noted this pm.   Stairs             Wheelchair Mobility    Modified Rankin (Stroke Patients Only)       Balance Overall balance assessment: Needs assistance   Sitting balance-Leahy Scale: Fair       Standing balance-Leahy Scale: Poor Standing balance comment: BUE support.                            Cognition Arousal/Alertness: Awake/alert Behavior During Therapy: Anxious Overall Cognitive Status: Within Functional Limits for tasks assessed                                 General Comments: very anxious, intermittently crying.      Exercises Total Joint Exercises Ankle Circles/Pumps: AROM;Both;10 reps;Supine Quad Sets: AROM;Left;10 reps;Supine Heel Slides: AAROM;Left;10 reps;Supine Hip ABduction/ADduction: AAROM;Left;10 reps;Supine Straight Leg Raises: AAROM;Left;10 reps;Supine Knee Flexion: AAROM;Left;10 reps;Supine Goniometric ROM: not measured- grossly 10-30    General Comments        Pertinent Vitals/Pain Pain Assessment: Faces Faces Pain Scale: Hurts whole lot Pain Location: lt knee Pain Descriptors / Indicators: Burning Pain Intervention(s): Monitored during session;Repositioned;Ice applied    Home Living                      Prior Function            PT Goals (current goals can now be found in the care plan section) Acute Rehab PT Goals Patient  Stated Goal: return home Potential to Achieve Goals: Good Progress towards PT goals: Progressing toward goals    Frequency    7X/week      PT Plan Current plan remains appropriate    Co-evaluation              AM-PAC PT "6 Clicks" Mobility   Outcome Measure  Help needed turning from your back to your side while in a flat bed without using bedrails?: A Little Help needed moving from lying on your back to sitting on the side of a flat bed without using bedrails?: A Little Help needed moving to and from a bed to a chair  (including a wheelchair)?: A Little Help needed standing up from a chair using your arms (e.g., wheelchair or bedside chair)?: A Little Help needed to walk in hospital room?: A Little Help needed climbing 3-5 steps with a railing? : A Lot 6 Click Score: 17    End of Session Equipment Utilized During Treatment: Gait belt Activity Tolerance: Patient limited by pain Patient left: in bed;with bed alarm set;with call bell/phone within reach Nurse Communication: Mobility status PT Visit Diagnosis: Other abnormalities of gait and mobility (R26.89);Pain Pain - Right/Left: Left Pain - part of body: Knee     Time: 1540-0867 PT Time Calculation (min) (ACUTE ONLY): 36 min  Charges:  $Gait Training: 8-22 mins $Therapeutic Exercise: 8-22 mins                     Bonney Leitz , PTA Acute Rehabilitation Services Pager [phone] Office [phone]     Kristi Decker Delay 08/18/2020, 3:22 PM

## 2020-08-18 NOTE — TOC Initial Note (Signed)
Transition of Care [facility]) - Initial/Assessment Note    Patient Details  Name: Kristi Decker MRN: [id number] Date of Birth: 02-08-1976  [facility] CM/SW Contact:    Kingsley Plan, RN Phone Number: 08/18/2020, 9:45 AM  Clinical Narrative:                 Spoke to patent at bedside.  Confirmed face sheet information. Patient from home with husband.Already has walker , needs 3 in1 same ordered with Velna Hatchet with [employer]. Kandee Keen with [facility] accepted referral for home health PT.   Patient lives with husband , son and her mother will be staying with her "for awhile" to help.   Expected Discharge Plan: Home w Home Health Services Barriers to Discharge:  (Pain control and how she does with PT)   Patient Goals and CMS Choice Patient states their goals for this hospitalization and ongoing recovery are:: to return to home   Choice offered to / list presented to : Patient  Expected Discharge Plan and Services Expected Discharge Plan: Home w Home Health Services   Discharge Planning Services: CM Consult Post Acute Care Choice: Home Health Living arrangements for the past 2 months: Single Family Home                 DME Arranged: 3-N-1 DME Agency: [employer] Date DME Agency Contacted: 08/18/20 Time DME Agency Contacted: [phone] Representative spoke with at DME Agency: Velna Hatchet HH Arranged: PT HH Agency: [facility] Date [facility] Agency Contacted: 08/18/20 Time HH Agency Contacted: [phone] Representative spoke with at [facility] Agency: Kandee Keen checking to see if he can accept  Prior Living Arrangements/Services Living arrangements for the past 2 months: Single Family Home Lives with:: Spouse Patient language and need for interpreter reviewed:: Yes Do you feel safe going back to the place where you live?: Yes      Need for Family Participation in Patient Care: Yes (Comment) Care giver support system in place?: Yes (comment) Current home services: DME Criminal  Activity/Legal Involvement Pertinent to Current Situation/Hospitalization: No - Comment as needed  Activities of Daily Living      Permission Sought/Granted   Permission granted to share information with : No              Emotional Assessment Appearance:: Appears stated age Attitude/Demeanor/Rapport: Engaged Affect (typically observed): Accepting Orientation: : Oriented to Situation,Oriented to  Time,Oriented to Place,Oriented to Self Alcohol / Substance Use: Not Applicable Psych Involvement: No (comment)  Admission diagnosis:  S/P total knee arthroplasty, left [Z96.652] Patient Active Problem List   Diagnosis Date Noted  . S/P total knee arthroplasty, left 08/17/2020   PCP:  Avanell Shackleton, NP-C Pharmacy:   [facility] [address], [place] - [address] [place] [address] Phone: [phone] Fax: [phone]     Social Determinants of Health (SDOH) Interventions    Readmission Risk Interventions No flowsheet data found.

## 2020-08-18 NOTE — Anesthesia Postprocedure Evaluation (Signed)
Anesthesia Post Note  Patient: Kristi Decker  Procedure(s) Performed: LEFT TOTAL KNEE ARTHROPLASTY (Left Knee)     Patient location during evaluation: PACU Anesthesia Type: Spinal Level of consciousness: awake and alert and oriented Pain management: pain level controlled Vital Signs Assessment: post-procedure vital signs reviewed and stable Respiratory status: spontaneous breathing, nonlabored ventilation and respiratory function stable Cardiovascular status: blood pressure returned to baseline Postop Assessment: no apparent nausea or vomiting, spinal receding, no headache and no backache Anesthetic complications: no   No complications documented.           Kaylyn Layer

## 2020-08-18 NOTE — Progress Notes (Signed)
  Subjective: Kristi Decker is a 45 y.o. female s/p left TKA.  They are POD 1.  Pt's pain is moderate but controlled.  Pt denies numbness/tingling/weakness.  Pt has not ambulated yet.  Denies lightheadedness or dizziness  Objective: Vital signs in last 24 hours: Temp:  [97.5 F (36.4 C)-98.8 F (37.1 C)] 98.2 F (36.8 C) (04/06 0454) Pulse Rate:  [77-108] 82 (04/06 0454) Resp:  [10-18] 18 (04/06 0454) BP: (92-165)/(50-105) 102/59 (04/06 0454) SpO2:  [89 %-100 %] 94 % (04/06 0454) Weight:  [99.3 kg] 99.3 kg (04/05 0921)  Intake/Output from previous day: 04/05 0701 - 04/06 0700 In: 2241.1 [P.O.:240; I.V.:1901.1; IV Piggyback:100] Out: 1790 [Urine:1750; Blood:40] Intake/Output this shift: Total I/O In: 374 [P.O.:120; I.V.:254] Out: 350 [Urine:350]  Exam:  No gross blood or drainage overlying the dressing 2+ PT pulse Sensation intact distally in the left foot Able to dorsiflex and plantarflex the left foot Unable to perform straight leg raise   Labs: No results for input(s): HGB in the last 72 hours. No results for input(s): WBC, RBC, HCT, PLT in the last 72 hours. No results for input(s): NA, K, CL, CO2, BUN, CREATININE, GLUCOSE, CALCIUM in the last 72 hours. No results for input(s): LABPT, INR in the last 72 hours.  Assessment/Plan: Pt is POD 1 s/p left TKA.    -Plan to discharge to home today or tomorrow pending patient's pain and PT eval  -WBAT with a walker  -She has been using CPM machine while inpatient, plan for continued use as she can tolerate.  Also plan to reach out to Ortho tech for 0 degrees bone foam   Kristi Decker L Kristi Decker 08/18/2020, 8:40 AM

## 2020-08-19 ENCOUNTER — Ambulatory Visit: Payer: Self-pay | Admitting: Family Medicine

## 2020-08-19 DIAGNOSIS — M1712 Unilateral primary osteoarthritis, left knee: Secondary | ICD-10-CM | POA: Diagnosis not present

## 2020-08-19 MED ORDER — OXYCODONE HCL 5 MG PO TABS: 5.0000 mg | ORAL_TABLET | ORAL | 0 refills | Status: DC | PRN

## 2020-08-19 MED ORDER — ACETAMINOPHEN 500 MG PO TABS: 500.0000 mg | ORAL_TABLET | Freq: Four times a day (QID) | ORAL | 0 refills | Status: DC | PRN

## 2020-08-19 MED ORDER — GABAPENTIN 300 MG PO CAPS: 300.0000 mg | ORAL_CAPSULE | Freq: Three times a day (TID) | ORAL | 0 refills | Status: DC

## 2020-08-19 MED ORDER — ASPIRIN 81 MG PO CHEW: 81.0000 mg | CHEWABLE_TABLET | Freq: Every day | ORAL | 0 refills | Status: DC

## 2020-08-19 MED ORDER — METHOCARBAMOL 500 MG PO TABS: 500.0000 mg | ORAL_TABLET | Freq: Three times a day (TID) | ORAL | 0 refills | Status: DC | PRN

## 2020-08-19 NOTE — Progress Notes (Signed)
PT Cancellation Note  Patient Details Name: Kristi Decker MRN: [id number] DOB: Aug 17, 1975   Cancelled Treatment:    Reason Eval/Treat Not Completed: (P) Patient declined, no reason specified (Pt eating breakfast on first attempt 09:38, checked at 10:07 before entering room patient does not appear to be in pain. Once PTA entereded she immediately became anxious and tearful not wanting to participate until she has had her pain meds.)   Jeriyah Granlund Artis Delay 08/19/2020, 10:09 AM  Bonney Leitz , PTA Acute Rehabilitation Services Pager [phone] Office [phone]

## 2020-08-19 NOTE — Progress Notes (Signed)
  Subjective: Pt stable - last pm pain meds worked better than original pain meds   Objective: Vital signs in last 24 hours: Temp:  [97.7 F (36.5 C)-98.8 F (37.1 C)] 97.8 F (36.6 C) (04/07 0449) Pulse Rate:  [88-103] 99 (04/07 0449) Resp:  [16-20] 16 (04/07 0449) BP: (109-144)/(68-83) 109/69 (04/07 0449) SpO2:  [92 %-98 %] 92 % (04/07 0449)  Intake/Output from previous day: 04/06 0701 - 04/07 0700 In: 2051.5 [P.O.:1200; I.V.:801.5; IV Piggyback:50] Out: 1250 [Urine:1250] Intake/Output this shift: No intake/output data recorded.  Exam:  Intact pulses distally Dorsiflexion/Plantar flexion intact  Labs: No results for input(s): HGB in the last 72 hours. No results for input(s): WBC, RBC, HCT, PLT in the last 72 hours. No results for input(s): NA, K, CL, CO2, BUN, CREATININE, GLUCOSE, CALCIUM in the last 72 hours. No results for input(s): LABPT, INR in the last 72 hours.  Assessment/Plan: Plan PT this am and possible dc this afternoon Dilaudid vs oxy for dc pain meds   G Dorene Grebe 08/19/2020, 8:17 AM

## 2020-08-19 NOTE — Progress Notes (Addendum)
Physical Therapy Treatment Patient Details Name: Kristi Decker MRN: [id number] DOB: June 11, 1975 Today's Date: 08/19/2020    History of Present Illness Pt adm 4/5 for lt TKR. PMH - copd, chronic back pain, anxiety, depression    PT Comments    Pt in much better spirits and reports pain as well controlled.  Pt functioning well enough to d/c from a PT stand point.  Informed nursing and PA-C of readiness for d/c home.  HEP and stair handout issued.     Follow Up Recommendations  Follow surgeon's recommendation for DC plan and follow-up therapies     Equipment Recommendations  None recommended by PT    Recommendations for Other Services       Precautions / Restrictions Precautions Precautions: Knee;Fall Precaution Booklet Issued: No Restrictions Weight Bearing Restrictions: Yes LLE Weight Bearing: Weight bearing as tolerated    Mobility  Bed Mobility Overal bed mobility: Needs Assistance Bed Mobility: Supine to Sit;Sit to Supine     Supine to sit: Supervision Sit to supine: Supervision   General bed mobility comments: Cues for hand placement and LE advancement.  No physical assistance needed.    Transfers Overall transfer level: Needs assistance Equipment used: Rolling walker (2 wheeled) Transfers: Sit to/from Stand Sit to Stand: Min guard         General transfer comment: Cues for hand placement to and from seated surface.  Min guard for safety.  Ambulation/Gait Ambulation/Gait assistance: Min guard Gait Distance (Feet): 150 Feet Assistive device: Rolling walker (2 wheeled) Gait Pattern/deviations: Step-to pattern;Antalgic;Decreased stride length;Trunk flexed     General Gait Details: Pt with improved weight bearing this session and able to progress to step through pattern.  Cues for gt symmetry and upper trunk control.   Stairs Stairs: Yes Stairs assistance: Min assist Stair Management: No rails;Backwards;Forwards;With walker Number of Stairs:  2 General stair comments: Cues for sequencing and RW placement.  Hand out issued as no family in room during instruction.   Wheelchair Mobility    Modified Rankin (Stroke Patients Only)       Balance Overall balance assessment: Needs assistance   Sitting balance-Leahy Scale: Fair       Standing balance-Leahy Scale: Poor                              Cognition Arousal/Alertness: Awake/alert Behavior During Therapy: Anxious (less anxious than previous sessions.) Overall Cognitive Status: Within Functional Limits for tasks assessed                                        Exercises Total Joint Exercises Ankle Circles/Pumps: AROM;Both;10 reps;Supine Quad Sets: AROM;Left;10 reps;Supine Towel Squeeze: AROM;Both;10 reps;Supine Heel Slides: AAROM;Left;10 reps;Supine Hip ABduction/ADduction: AAROM;Left;10 reps;Supine Straight Leg Raises: AAROM;Left;10 reps;Supine Goniometric ROM: 5-45 L knee.    General Comments        Pertinent Vitals/Pain Pain Assessment: Faces Faces Pain Scale: Hurts little more Pain Location: lt knee Pain Descriptors / Indicators: Burning Pain Intervention(s): Monitored during session;Repositioned    Home Living                      Prior Function            PT Goals (current goals can now be found in the care plan section) Acute Rehab PT Goals Patient Stated Goal: return home Potential  to Achieve Goals: Good Progress towards PT goals: Progressing toward goals    Frequency    7X/week      PT Plan Current plan remains appropriate    Co-evaluation              AM-PAC PT "6 Clicks" Mobility   Outcome Measure  Help needed turning from your back to your side while in a flat bed without using bedrails?: A Little Help needed moving from lying on your back to sitting on the side of a flat bed without using bedrails?: A Little Help needed moving to and from a bed to a chair (including a  wheelchair)?: A Little Help needed standing up from a chair using your arms (e.g., wheelchair or bedside chair)?: A Little Help needed to walk in hospital room?: A Little Help needed climbing 3-5 steps with a railing? : A Little 6 Click Score: 18    End of Session Equipment Utilized During Treatment: Gait belt Activity Tolerance: Patient limited by pain Patient left: in bed;with bed alarm set;with call bell/phone within reach Nurse Communication: Mobility status PT Visit Diagnosis: Other abnormalities of gait and mobility (R26.89);Pain Pain - Right/Left: Left Pain - part of body: Knee     Time: 3382-5053 PT Time Calculation (min) (ACUTE ONLY): 33 min  Charges:  $Gait Training: 8-22 mins $Therapeutic Exercise: 8-22 mins                     Bonney Leitz , PTA Acute Rehabilitation Services Pager [phone] Office [phone]     Adeleigh Barletta Artis Delay 08/19/2020, 2:07 PM

## 2020-08-20 ENCOUNTER — Encounter: Payer: Self-pay | Admitting: Internal Medicine

## 2020-08-20 ENCOUNTER — Ambulatory Visit: Payer: Self-pay | Admitting: Family Medicine

## 2020-08-23 ENCOUNTER — Other Ambulatory Visit: Payer: Self-pay | Admitting: Surgical

## 2020-08-23 MED ORDER — OXYCODONE HCL 5 MG PO TABS: 5.0000 mg | ORAL_TABLET | Freq: Three times a day (TID) | ORAL | 0 refills | Status: DC | PRN

## 2020-08-25 ENCOUNTER — Other Ambulatory Visit: Payer: Self-pay | Admitting: Surgical

## 2020-08-25 NOTE — Telephone Encounter (Signed)
Pls advise.  

## 2020-08-26 NOTE — Discharge Summary (Signed)
Physician Discharge Summary      Patient ID: Kristi Decker MRN: [id number] DOB/AGE: 1975/08/24 45 y.o.  Admit date: 08/17/2020 Discharge date: 08/19/2020  Admission Diagnoses:  Active Problems:   S/P total knee arthroplasty, left   Discharge Diagnoses:  Same  Surgeries: Procedure(s): LEFT TOTAL KNEE ARTHROPLASTY on 08/17/2020   Consultants:   Discharged Condition: Stable  Hospital Course: Kristi Decker is an 45 y.o. female who was admitted 08/17/2020 with a chief complaint of left knee pain, and found to have a diagnosis of left knee arthritis.  They were brought to the operating room on 08/17/2020 and underwent the above named procedures.  Pt awoke from anesthesia without complication and was transferred to the floor. On POD1, patient was able to ambulate but complained of pain with sobbing during therapy session.Her pain progressed and improved on POD 2 and she was able to mobilize better.  She is functioning well enough to discharge cording to PT.  She felt prepared for discharge as well.  She was discharged home on POD 2..  Pt will f/u with Dr. August Saucer in clinic in ~2 weeks.   Antibiotics given:  Anti-infectives (From admission, onward)   Start     Dose/Rate Route Frequency Ordered Stop   08/17/20 2200  ceFAZolin (ANCEF) IVPB 2g/100 mL premix        2 g 200 mL/hr over 30 Minutes Intravenous Every 8 hours 08/17/20 1754 08/18/20 0540   08/17/20 1456  vancomycin (VANCOCIN) powder  Status:  Discontinued          As needed 08/17/20 1457 08/17/20 1601   08/17/20 0930  ceFAZolin (ANCEF) IVPB 2g/100 mL premix        2 g 200 mL/hr over 30 Minutes Intravenous On call to O.R. 08/17/20 0915 08/17/20 1300    .  Recent vital signs:  Vitals:   08/19/20 0449 08/19/20 0913  BP: 109/69   Pulse: 99 (!) 105  Resp: 16 18  Temp: 97.8 F (36.6 C)   SpO2: 92% 97%    Recent laboratory studies:  Results for orders placed or performed during the hospital encounter of 08/13/20  Surgical pcr  screen   Specimen: Nasal Mucosa; Nasal Swab  Result Value Ref Range   MRSA, PCR NEGATIVE NEGATIVE   Staphylococcus aureus NEGATIVE NEGATIVE  Urine culture   Specimen: Urine, Clean Catch  Result Value Ref Range   Specimen Description URINE, CLEAN CATCH    Special Requests      NONE Performed at [facility], [address] [place], [place] [address]    Culture MULTIPLE SPECIES PRESENT, SUGGEST RECOLLECTION (A)    Report Status 08/16/2020 FINAL   SARS CORONAVIRUS 2 (TAT 6-24 HRS) Nasopharyngeal Nasopharyngeal Swab   Specimen: Nasopharyngeal Swab  Result Value Ref Range   SARS Coronavirus 2 NEGATIVE NEGATIVE  CBC  Result Value Ref Range   WBC 8.5 4.0 - 10.5 K/uL   RBC 4.56 3.87 - 5.11 MIL/uL   Hemoglobin 13.7 12.0 - 15.0 g/dL   HCT 19.1 47.8 - 29.5 %   MCV 92.1 80.0 - 100.0 fL   MCH 30.0 26.0 - 34.0 pg   MCHC 32.6 30.0 - 36.0 g/dL   RDW 62.1 30.8 - 65.7 %   Platelets 467 (H) 150 - 400 K/uL   nRBC 0.0 0.0 - 0.2 %  Basic metabolic panel  Result Value Ref Range   Sodium 137 135 - 145 mmol/L   Potassium 3.4 (L) 3.5 - 5.1 mmol/L  Chloride 101 98 - 111 mmol/L   CO2 27 22 - 32 mmol/L   Glucose, Bld 106 (H) 70 - 99 mg/dL   BUN 18 6 - 20 mg/dL   Creatinine, Ser 1.610.64 0.44 - 1.00 mg/dL   Calcium 09.610.3 8.9 - 04.510.3 mg/dL   GFR, Estimated >40>60 >98>60 mL/min   Anion gap 9 5 - 15  Urinalysis, Routine w reflex microscopic  Result Value Ref Range   Color, Urine AMBER (A) YELLOW   APPearance HAZY (A) CLEAR   Specific Gravity, Urine 1.031 (H) 1.005 - 1.030   pH 5.0 5.0 - 8.0   Glucose, UA NEGATIVE NEGATIVE mg/dL   Hgb urine dipstick NEGATIVE NEGATIVE   Bilirubin Urine NEGATIVE NEGATIVE   Ketones, ur NEGATIVE NEGATIVE mg/dL   Protein, ur NEGATIVE NEGATIVE mg/dL   Nitrite NEGATIVE NEGATIVE   Leukocytes,Ua NEGATIVE NEGATIVE  No blood products  Result Value Ref Range   Transfuse no blood products      TRANSFUSE NO BLOOD PRODUCTS, VERIFIED BY HOEFLER,SIERRA RN ON [phone] AT  1322 Performed at [facility], [address] [place], [place] [address]     Discharge Medications:   Allergies as of 08/19/2020   No Known Allergies     Medication List    STOP taking these medications   clonazePAM 0.5 MG tablet Commonly known as: KLONOPIN   ibuprofen 200 MG tablet Commonly known as: ADVIL   sulfaSALAzine 500 MG tablet Commonly known as: AZULFIDINE     TAKE these medications   acetaminophen 500 MG tablet Commonly known as: TYLENOL Take 1 tablet (500 mg total) by mouth every 6 (six) hours as needed for headache (pain). What changed: how much to take   albuterol 108 (90 Base) MCG/ACT inhaler Commonly known as: VENTOLIN HFA Inhale 2 puffs into the lungs every 6 (six) hours as needed for wheezing or shortness of breath.   APPLE CIDER VINEGAR PO Take 1 capsule by mouth 2 (two) times daily.   aspirin 81 MG chewable tablet Chew 1 tablet (81 mg total) by mouth daily.   budesonide-formoterol 160-4.5 MCG/ACT inhaler Commonly known as: SYMBICORT Inhale 2 puffs into the lungs 2 (two) times daily as needed (shortness of breath/wheezing).   CAL-MAG-ZINC PO Take 1 tablet by mouth daily.   cholecalciferol 25 MCG (1000 UNIT) tablet Commonly known as: VITAMIN D3 Take 1,000 Units by mouth daily.   diclofenac 75 MG EC tablet Commonly known as: VOLTAREN Take 75 mg by mouth 2 (two) times daily.   DULoxetine 60 MG capsule Commonly known as: CYMBALTA Take 120 mg by mouth daily.   fenofibrate 145 MG tablet Commonly known as: TRICOR Take 145 mg by mouth daily.   ferrous sulfate 325 (65 FE) MG tablet Take 325 mg by mouth daily.   lamoTRIgine 25 MG tablet Commonly known as: LAMICTAL Take 50 mg by mouth daily.   lisinopril-hydrochlorothiazide 20-12.5 MG tablet Commonly known as: ZESTORETIC Take 1 tablet by mouth daily.   methocarbamol 500 MG tablet Commonly known as: ROBAXIN Take 1 tablet (500 mg total) by mouth every 8 (eight) hours as needed for  muscle spasms.   pantoprazole 40 MG tablet Commonly known as: PROTONIX Take 40 mg by mouth daily.   QUEtiapine 25 MG tablet Commonly known as: SEROQUEL Take 12.5 mg by mouth at bedtime.   VITAMIN B-12 PO Take 1 tablet by mouth daily.       Diagnostic Studies: No results found.  Disposition: Discharge disposition: 01-Home or Self  Care       Discharge Instructions    Call MD / Call 911   Complete by: As directed    If you experience chest pain or shortness of breath, CALL 911 and be transported to the hospital emergency room.  If you develope a fever above 101 F, pus (white drainage) or increased drainage or redness at the wound, or calf pain, call your surgeon's office.   Constipation Prevention   Complete by: As directed    Drink plenty of fluids.  Prune juice may be helpful.  You may use a stool softener, such as Colace (over the counter) 100 mg twice a day.  Use MiraLax (over the counter) for constipation as needed.   Diet - low sodium heart healthy   Complete by: As directed    Discharge instructions   Complete by: As directed    You may shower, dressing is waterproof.  Do not remove the dressing, we will remove it at your first post-op appointment.  Do not take a bath or soak the knee in a tub or pool.  You may weightbear as you can tolerate on the operative leg with a walker.  Continue using the CPM machine 3 times per day for one hour each time, increasing the degrees of range of motion daily.  Use the blue cradle boot under your heel to work on getting your leg straight.  Do NOT put a pillow under your knee.  You will follow-up with Dr. August Saucer in the clinic in 2 weeks at your given appointment date.    INSTRUCTIONS AFTER JOINT REPLACEMENT   Remove items at home which could result in a fall. This includes throw rugs or furniture in walking pathways ICE to the affected joint every three hours while awake for 30 minutes at a time, for at least the first 3-5 days, and then  as needed for pain and swelling.  Continue to use ice for pain and swelling. You may notice swelling that will progress down to the foot and ankle.  This is normal after surgery.  Elevate your leg when you are not up walking on it.   Continue to use the breathing machine you got in the hospital (incentive spirometer) which will help keep your temperature down.  It is common for your temperature to cycle up and down following surgery, especially at night when you are not up moving around and exerting yourself.  The breathing machine keeps your lungs expanded and your temperature down.   DIET:  As you were doing prior to hospitalization, we recommend a well-balanced diet.  DRESSING / WOUND CARE / SHOWERING  Keep the surgical dressing until follow up.  The dressing is water proof, so you can shower without any extra covering.  IF THE DRESSING FALLS OFF or the wound gets wet inside, change the dressing with sterile gauze.  Please use good hand washing techniques before changing the dressing.  Do not use any lotions or creams on the incision until instructed by your surgeon.    ACTIVITY  Increase activity slowly as tolerated, but follow the weight bearing instructions below.   No driving for 6 weeks or until further direction given by your physician.  You cannot drive while taking narcotics.  No lifting or carrying greater than 10 lbs. until further directed by your surgeon. Avoid periods of inactivity such as sitting longer than an hour when not asleep. This helps prevent blood clots.  You may return to work once you  are authorized by your doctor.     WEIGHT BEARING   Weight bearing as tolerated with assist device (walker, cane, etc) as directed, use it as long as suggested by your surgeon or therapist, typically at least 4-6 weeks.   EXERCISES  Results after joint replacement surgery are often greatly improved when you follow the exercise, range of motion and muscle strengthening exercises  prescribed by your doctor. Safety measures are also important to protect the joint from further injury. Any time any of these exercises cause you to have increased pain or swelling, decrease what you are doing until you are comfortable again and then slowly increase them. If you have problems or questions, call your caregiver or physical therapist for advice.   Rehabilitation is important following a joint replacement. After just a few days of immobilization, the muscles of the leg can become weakened and shrink (atrophy).  These exercises are designed to build up the tone and strength of the thigh and leg muscles and to improve motion. Often times heat used for twenty to thirty minutes before working out will loosen up your tissues and help with improving the range of motion but do not use heat for the first two weeks following surgery (sometimes heat can increase post-operative swelling).   These exercises can be done on a training (exercise) mat, on the floor, on a table or on a bed. Use whatever works the best and is most comfortable for you.    Use music or television while you are exercising so that the exercises are a pleasant break in your day. This will make your life better with the exercises acting as a break in your routine that you can look forward to.   Perform all exercises about fifteen times, three times per day or as directed.  You should exercise both the operative leg and the other leg as well.  Exercises include:   Quad Sets - Tighten up the muscle on the front of the thigh (Quad) and hold for 5-10 seconds.   Straight Leg Raises - With your knee straight (if you were given a brace, keep it on), lift the leg to 60 degrees, hold for 3 seconds, and slowly lower the leg.  Perform this exercise against resistance later as your leg gets stronger.  Leg Slides: Lying on your back, slowly slide your foot toward your buttocks, bending your knee up off the floor (only go as far as is  comfortable). Then slowly slide your foot back down until your leg is flat on the floor again.  Angel Wings: Lying on your back spread your legs to the side as far apart as you can without causing discomfort.  Hamstring Strength:  Lying on your back, push your heel against the floor with your leg straight by tightening up the muscles of your buttocks.  Repeat, but this time bend your knee to a comfortable angle, and push your heel against the floor.  You may put a pillow under the heel to make it more comfortable if necessary.   A rehabilitation program following joint replacement surgery can speed recovery and prevent re-injury in the future due to weakened muscles. Contact your doctor or a physical therapist for more information on knee rehabilitation.    CONSTIPATION  Constipation is defined medically as fewer than three stools per week and severe constipation as less than one stool per week.  Even if you have a regular bowel pattern at home, your normal regimen is likely to  be disrupted due to multiple reasons following surgery.  Combination of anesthesia, postoperative narcotics, change in appetite and fluid intake all can affect your bowels.   YOU MUST use at least one of the following options; they are listed in order of increasing strength to get the job done.  They are all available over the counter, and you may need to use some, POSSIBLY even all of these options:    Drink plenty of fluids (prune juice may be helpful) and high fiber foods Colace 100 mg by mouth twice a day  Senokot for constipation as directed and as needed Dulcolax (bisacodyl), take with full glass of water  Miralax (polyethylene glycol) once or twice a day as needed.  If you have tried all these things and are unable to have a bowel movement in the first 3-4 days after surgery call either your surgeon or your primary doctor.    If you experience loose stools or diarrhea, hold the medications until you stool forms back  up.  If your symptoms do not get better within 1 week or if they get worse, check with your doctor.  If you experience "the worst abdominal pain ever" or develop nausea or vomiting, please contact the office immediately for further recommendations for treatment.   ITCHING:  If you experience itching with your medications, try taking only a single pain pill, or even half a pain pill at a time.  You can also use Benadryl over the counter for itching or also to help with sleep.   TED HOSE STOCKINGS:  Use stockings on both legs until for at least 2 weeks or as directed by physician office. They may be removed at night for sleeping.  MEDICATIONS:  See your medication summary on the "After Visit Summary" that nursing will review with you.  You may have some home medications which will be placed on hold until you complete the course of blood thinner medication.  It is important for you to complete the blood thinner medication as prescribed.  PRECAUTIONS:  If you experience chest pain or shortness of breath - call 911 immediately for transfer to the hospital emergency department.   If you develop a fever greater that 101 F, purulent drainage from wound, increased redness or drainage from wound, foul odor from the wound/dressing, or calf pain - CONTACT YOUR SURGEON.                                                   FOLLOW-UP APPOINTMENTS:  If you do not already have a post-op appointment, please call the office for an appointment to be seen by your surgeon.  Guidelines for how soon to be seen are listed in your "After Visit Summary", but are typically between 1-4 weeks after surgery.  OTHER INSTRUCTIONS:   Knee Replacement:  Do not place pillow under knee, focus on keeping the knee straight while resting. CPM instructions: 0-90 degrees, 2 hours in the morning, 2 hours in the afternoon, and 2 hours in the evening. Place foam block, curve side up under heel at all times except when in CPM or when walking.  DO  NOT modify, tear, cut, or change the foam block in any way.  POST-OPERATIVE OPIOID TAPER INSTRUCTIONS: It is important to wean off of your opioid medication as soon as possible. If you do not need  pain medication after your surgery it is ok to stop day one. Opioids include: Codeine, Hydrocodone(Norco, Vicodin), Oxycodone(Percocet, oxycontin) and hydromorphone amongst others.  Long term and even short term use of opiods can cause: Increased pain response Dependence Constipation Depression Respiratory depression And more.  Withdrawal symptoms can include Flu like symptoms Nausea, vomiting And more Techniques to manage these symptoms Hydrate well Eat regular healthy meals Stay active Use relaxation techniques(deep breathing, meditating, yoga) Do Not substitute Alcohol to help with tapering If you have been on opioids for less than two weeks and do not have pain than it is ok to stop all together.  Plan to wean off of opioids This plan should start within one week post op of your joint replacement. Maintain the same interval or time between taking each dose and first decrease the dose.  Cut the total daily intake of opioids by one tablet each day Next start to increase the time between doses. The last dose that should be eliminated is the evening dose.   MAKE SURE YOU:  Understand these instructions.  Get help right away if you are not doing well or get worse.    Thank you for letting us be a part of your medical care team.  It is a privilege we respect greatly.  We hope these instructions will help you stay on track for a fast and full recovery!    Dental Antibiotics:  In most cases prophylactic antibiotics for Dental procdeures after total joint surgery are not necessary.  Exceptions are as follows:  1. History of prior total joint infection  2. Severely immunocompromised (Organ Transplant, cancer chemotherapy, Rheumatoid biologic meds such as Humera)  3. Poorly  controlled diabetes (A1C &gt; 8.0, blood glucose over 200)  If you have one of these conditions, contact your surgeon for an antibiotic prescription, prior to your dental procedure.   Increase activity slowly as tolerated   Complete by: As directed        Follow-up Information    [facility], [facility] Follow up.   Specialty: Home Health Services Contact information: [address] [place] [address] [phone]                Signed: Julieanne Cotton 08/26/2020, 8:08 AM

## 2020-08-29 DIAGNOSIS — M1712 Unilateral primary osteoarthritis, left knee: Secondary | ICD-10-CM

## 2020-09-01 ENCOUNTER — Ambulatory Visit (INDEPENDENT_AMBULATORY_CARE_PROVIDER_SITE_OTHER): Payer: 59 | Admitting: Orthopedic Surgery

## 2020-09-01 ENCOUNTER — Ambulatory Visit: Payer: Self-pay

## 2020-09-01 ENCOUNTER — Other Ambulatory Visit: Payer: Self-pay

## 2020-09-01 ENCOUNTER — Encounter: Payer: Self-pay | Admitting: Orthopedic Surgery

## 2020-09-01 ENCOUNTER — Ambulatory Visit (INDEPENDENT_AMBULATORY_CARE_PROVIDER_SITE_OTHER): Payer: 59

## 2020-09-01 DIAGNOSIS — Z96652 Presence of left artificial knee joint: Secondary | ICD-10-CM

## 2020-09-01 DIAGNOSIS — M542 Cervicalgia: Secondary | ICD-10-CM | POA: Diagnosis not present

## 2020-09-01 NOTE — Progress Notes (Signed)
Post-Op Visit Note   Patient: Kristi Decker           Date of Birth: Feb 21, 1976           MRN: [id number] Visit Date: 09/01/2020 PCP: Avanell Shackleton, NP-C   Assessment & Plan:  Chief Complaint:  Chief Complaint  Patient presents with  . Left Knee - Routine Post Op   Visit Diagnoses:  1. S/P total knee arthroplasty, left   2. Neck pain, acute     Plan: Patient is a 45 year old female who presents s/p left total knee arthroplasty on 08/17/2020.  She reports that she is doing very well.  She is ambulating with a walker.  She has been doing home health physical therapy and home exercise program.  Pain is well controlled and only taking oxycodone 1-2 times per day at this point.  Denies any calf pain or shortness of breath.  On exam she has 0 degrees of extension and about 90 degrees of knee flexion.  Incisions healing well without any evidence of infection or dehiscence.  No calf tenderness.  Negative Homans' sign.  Able to perform multiple straight leg raises.  Radiographs show left knee prosthesis in excellent position with no complicating features.  Plan to start outpatient physical therapy 2 times per week and follow-up in 4 weeks for clinical recheck.  Patient also complains of persistent right arm numbness and tingling since a fall in January.  She reports her entire arm goes numb throughout the day and causes her to wake up at night.  She notes the involvement of all 5 fingers as well as the dorsal aspect of her hand.  No left-sided symptoms.  She has occasional neck discomfort but nothing consistent.  No shoulder blade pain.  No weakness.  On exam she has full active motion of her right shoulder with 5/5 motor strength of bilateral grip strength, finger abduction, pronation/supination, bicep, tricep, deltoid.  No tenderness over the axial cervical spine.  Negative Spurling sign bilaterally.  No pain with active range of motion of the cervical spine.  With continued symptoms and no  improvement over the last several months and pain interferes with her lifestyle with her waking up at night, plan to order an MRI of the cervical spine for further evaluation as well as order nerve conduction study of the right upper extremity.  Follow-up after MRI to review results.  Follow-Up Instructions: Return in about 4 weeks (around 09/29/2020).   Orders:  Orders Placed This Encounter  Procedures  . XR Knee 1-2 Views Left  . XR Cervical Spine 2 or 3 views   No orders of the defined types were placed in this encounter.   Imaging: XR Knee 1-2 Views Left  Result Date: 09/01/2020 AP lateral radiographs left knee reviewed.  Total knee prosthesis in good position alignment with no complicating features.  Patella height normal relative to distal femur.   PMFS History: Patient Active Problem List   Diagnosis Date Noted  . Arthritis of left knee   . S/P total knee arthroplasty, left 08/17/2020   Past Medical History:  Diagnosis Date  . Anemia   . Anxiety   . Arthritis    left knee  . Asthma   . COPD (chronic obstructive pulmonary disease) (HCC)   . Depression   . GERD (gastroesophageal reflux disease)   . Hypertension   . Neuromuscular disorder (HCC)    right arm    No family history on file.  Past Surgical  History:  Procedure Laterality Date  . DILATION AND CURETTAGE OF UTERUS     2002  . TOTAL KNEE ARTHROPLASTY Left 08/17/2020   Procedure: LEFT TOTAL KNEE ARTHROPLASTY;  Surgeon: Cammy Copa, MD;  Location: [facility] OR;  Service: Orthopedics;  Laterality: Left;  . UPPER GI ENDOSCOPY    . WISDOM TOOTH EXTRACTION     Social History   Occupational History  . Not on file  Tobacco Use  . Smoking status: Never Smoker  . Smokeless tobacco: Never Used  Vaping Use  . Vaping Use: Never used  Substance and Sexual Activity  . Alcohol use: Never  . Drug use: Never  . Sexual activity: Not on file

## 2020-09-02 ENCOUNTER — Other Ambulatory Visit: Payer: Self-pay | Admitting: Surgical

## 2020-09-02 MED ORDER — OXYCODONE HCL 5 MG PO TABS: 5.0000 mg | ORAL_TABLET | Freq: Two times a day (BID) | ORAL | 0 refills | Status: DC | PRN

## 2020-09-02 MED ORDER — ASPIRIN 81 MG PO CHEW: 81.0000 mg | CHEWABLE_TABLET | Freq: Every day | ORAL | 0 refills | Status: DC

## 2020-09-02 NOTE — Telephone Encounter (Signed)
Please advise 

## 2020-09-03 ENCOUNTER — Other Ambulatory Visit: Payer: Self-pay

## 2020-09-03 ENCOUNTER — Encounter: Payer: Self-pay | Admitting: Family Medicine

## 2020-09-03 ENCOUNTER — Ambulatory Visit (INDEPENDENT_AMBULATORY_CARE_PROVIDER_SITE_OTHER): Payer: 59 | Admitting: Family Medicine

## 2020-09-03 VITALS — BP 122/70 | HR 82 | Ht 67.0 in | Wt 216.4 lb

## 2020-09-03 DIAGNOSIS — J4520 Mild intermittent asthma, uncomplicated: Secondary | ICD-10-CM

## 2020-09-03 DIAGNOSIS — K219 Gastro-esophageal reflux disease without esophagitis: Secondary | ICD-10-CM | POA: Diagnosis not present

## 2020-09-03 DIAGNOSIS — I10 Essential (primary) hypertension: Secondary | ICD-10-CM | POA: Diagnosis not present

## 2020-09-03 DIAGNOSIS — F419 Anxiety disorder, unspecified: Secondary | ICD-10-CM

## 2020-09-03 DIAGNOSIS — E781 Pure hyperglyceridemia: Secondary | ICD-10-CM | POA: Diagnosis not present

## 2020-09-03 DIAGNOSIS — Z87898 Personal history of other specified conditions: Secondary | ICD-10-CM

## 2020-09-03 DIAGNOSIS — F32A Depression, unspecified: Secondary | ICD-10-CM

## 2020-09-03 DIAGNOSIS — Z1231 Encounter for screening mammogram for malignant neoplasm of breast: Secondary | ICD-10-CM

## 2020-09-03 NOTE — Progress Notes (Signed)
   Subjective:    Patient ID: Kristi Decker, female    DOB: 1976/05/10, 45 y.o.   MRN: [id number]  HPI Chief Complaint  Patient presents with  . new pt    New pt get established. No concerns wants to just update medical chart- needs pap, mammogram,    She is new to the practice and here to establish care.  Previous medical care: [facility] in [place].   Other providers: Orthopedist- Dr. August Saucer Rheumatologist- in [place] diagnosed with fibromyalgia  Psychiatrist- in [facility]  Therapy online   Recent total knee replacement. Walking with a cane. Doing very well.  Reports working hard in PT.   HTN- taking lisinopril- HCTZ   Prediabetes-unknown last hemoglobin A1c  Hypertriglyceridemia- Tricor, fish oil  States she has never been on a statin.   Asthma and chronic bronchitis-reports using Symbicort and rarely needs albuterol Never smoked.   Anxiety and depression- Lamictal and Cymbalta.  Under the care of a psychiatrist and therapist.  GERD- Protonix daily and states she has tried to use it less often Endoscopy at [facility]   Denies fever, chills, dizziness, chest pain, palpitations, shortness of breath, abdominal pain, N/V/D, urinary symptoms, LE edema.   Social history: widow, remarried and has 2 kids, 1 grandchild, works from home with 2 kids   Health maintenance:  Mammogram: last year  Colonoscopy: never  Last Gynecological Exam: pap normal for the past several years with last abnormal pap in 2009.  Hot flashes and post menopausal. Is not interested on hormone therapy    Reviewed allergies, medications, past medical, surgical, family, and social history.    Review of Systems Pertinent positives and negatives in the history of present illness.     Objective:   Physical Exam BP 122/70   Pulse 82   Ht 5\' 7"  (1.702 m)   Wt 216 lb 6.4 oz (98.2 kg)   LMP 05/16/2019   SpO2 98%   BMI 33.89 kg/m   Alert and in no distress.  Cardiac exam shows a regular sinus rhythm  without murmurs or gallops. Lungs are clear to auscultation. Skin is warm and dry.        Assessment & Plan:  Primary hypertension  Hypertriglyceridemia  History of prediabetes  Gastroesophageal reflux disease, unspecified whether esophagitis present  Mild intermittent asthma without complication  Anxiety and depression  Encounter for screening mammogram for malignant neoplasm of breast - Plan: MM 3D SCREEN BREAST BILATERAL, CANCELED: MM DIGITAL SCREENING BILATERAL  She is new to the practice and here to establish care. Recent total knee replacement and is doing well. Blood pressure controlled.  Continue current medication. History of hypertriglyceridemia and prediabetes.  Declines labs today but will return for a fasting CPE. Asthma appears to be well controlled on Symbicort Continue seeing psychiatrist and therapist for mental health issues. She may call and schedule her mammogram at her convenience.

## 2020-09-07 ENCOUNTER — Other Ambulatory Visit: Payer: Self-pay

## 2020-09-07 DIAGNOSIS — M542 Cervicalgia: Secondary | ICD-10-CM

## 2020-09-07 DIAGNOSIS — M79601 Pain in right arm: Secondary | ICD-10-CM

## 2020-09-08 ENCOUNTER — Encounter: Payer: Self-pay | Admitting: Physical Therapy

## 2020-09-08 ENCOUNTER — Ambulatory Visit (INDEPENDENT_AMBULATORY_CARE_PROVIDER_SITE_OTHER): Payer: 59 | Admitting: Physical Therapy

## 2020-09-08 ENCOUNTER — Other Ambulatory Visit: Payer: Self-pay

## 2020-09-08 ENCOUNTER — Telehealth: Payer: Self-pay

## 2020-09-08 DIAGNOSIS — M25562 Pain in left knee: Secondary | ICD-10-CM

## 2020-09-08 DIAGNOSIS — M25662 Stiffness of left knee, not elsewhere classified: Secondary | ICD-10-CM

## 2020-09-08 DIAGNOSIS — R600 Localized edema: Secondary | ICD-10-CM | POA: Diagnosis not present

## 2020-09-08 DIAGNOSIS — R262 Difficulty in walking, not elsewhere classified: Secondary | ICD-10-CM

## 2020-09-08 NOTE — Therapy (Signed)
[facility] Physical Therapy [address] [place], [place], [address] Phone: [phone]   Fax:  [phone]  Physical Therapy Evaluation  Patient Details  Name: Kristi Decker MRN: [id number] Date of Birth: 1976-02-25 Referring Provider (PT): August Saucer Corrie Mckusick, MD   Encounter Date: 09/08/2020   PT End of Session - 09/08/20 1029    Visit Number 1    Number of Visits 16    Date for PT Re-Evaluation 11/05/20    PT Start Time 1022    PT Stop Time 1100    PT Time Calculation (min) 38 min    Equipment Utilized During Treatment Gait belt    Activity Tolerance Patient limited by pain    Behavior During Therapy Anxious           Past Medical History:  Diagnosis Date  . Anemia   . Anxiety   . Arthritis    left knee  . Asthma   . COPD (chronic obstructive pulmonary disease) (HCC)   . Depression   . GERD (gastroesophageal reflux disease)   . Hypertension   . Neuromuscular disorder (HCC)    right arm    Past Surgical History:  Procedure Laterality Date  . DILATION AND CURETTAGE OF UTERUS     2002  . TOTAL KNEE ARTHROPLASTY Left 08/17/2020   Procedure: LEFT TOTAL KNEE ARTHROPLASTY;  Surgeon: Cammy Copa, MD;  Location: [facility] OR;  Service: Orthopedics;  Laterality: Left;  . UPPER GI ENDOSCOPY    . WISDOM TOOTH EXTRACTION      There were no vitals filed for this visit.    Subjective Assessment - 09/08/20 1024    Subjective Pt s/p left TKA on 08/17/2020. Pt reporting 1/10 pain upon arrival. Pt amb with a straight cane with antalgic gait pattern. Pt reporting she had HHPT and just finished yesterday.    Pertinent History COPD, arthritis, anemia, depression, HTN, neuromuscular disorder    Limitations Standing;Walking;House hold activities    How long can you walk comfortably? 10 minutes with straight cane    Diagnostic tests X-ray    Patient Stated Goals I want to get back to my normal self.    Currently in Pain? Yes    Pain Score 1     Pain  Location Knee    Pain Orientation Left    Pain Descriptors / Indicators Aching;Sore    Pain Type Surgical pain;Acute pain    Pain Onset 1 to 4 weeks ago    Pain Frequency Intermittent    Aggravating Factors  bending    Pain Relieving Factors pain meds, ice/ elevate    Effect of Pain on Daily Activities difficulty with household chores and walking              [facility] PT Assessment - 09/08/20 0001      Assessment   Medical Diagnosis Z96.652 left TKA    Referring Provider (PT) Cammy Copa, MD    Onset Date/Surgical Date 08/17/20    Hand Dominance Right    Prior Therapy HHPT, just ended on 09/07/2020      Precautions   Precautions None      Restrictions   Weight Bearing Restrictions No      Balance Screen   Has the patient fallen in the past 6 months Yes    How many times? 1    Has the patient had a decrease in activity level because of a fear of falling?  No   however pt fell on right  arm and is now having numbness in right arm and into fingers   Is the patient reluctant to leave their home because of a fear of falling?  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      Cognition   Overall Cognitive Status Within Functional Limits for tasks assessed      Observation/Other Assessments   Focus on Therapeutic Outcomes (FOTO)  complete next visit due to pt arriving late      Observation/Other Assessments-Edema    Edema Circumferential      Circumferential Edema   Circumferential - Right 42 centimeters     Circumferential - Left  45 centimeters      Posture/Postural Control   Posture/Postural Control Postural limitations    Postural Limitations Rounded Shoulders;Forward head      ROM / Strength   AROM / PROM / Strength AROM;PROM;Strength      AROM   Overall AROM  Deficits    AROM Assessment Site Knee    Right/Left Knee Right;Left    Right Knee Extension 0    Right Knee Flexion 135    Left Knee Extension 4    Left Knee Flexion 95      PROM   Overall PROM  Deficits    PROM Assessment Site Knee    Right/Left Knee Right;Left    Right Knee Extension 0    Right Knee Flexion 138    Left Knee Extension 0    Left Knee Flexion 100      Strength   Overall Strength Deficits    Strength Assessment Site Knee    Right/Left Knee Right;Left    Right Knee Flexion 5/5    Right Knee Extension 5/5    Left Knee Flexion 4-/5    Left Knee Extension 4-/5      Palpation   Palpation comment tender along anterior joint line and distal quad      Transfers   Five time sit to stand comments  14.5 seconds with UE support      Ambulation/Gait   Assistive device Straight cane    Gait Pattern Step-through pattern;Decreased stance time - left;Decreased step length - right;Decreased step length - left;Decreased dorsiflexion - left;Antalgic                      Objective measurements completed on examination: See above findings.       [facility] - 09/08/20 0001      Exercises   Exercises Knee/Hip      Knee/Hip Exercises: Standing   Heel Raises 10 reps    Knee Flexion 5 reps    Hip Abduction Stengthening;Left;5 reps      Knee/Hip Exercises: Supine   Quad Sets Strengthening;AROM;Left;5 reps    Heel Slides AROM;Left;10 reps    Bridges Strengthening;5 reps;Limitations    Bridges Limitations holding 5 seconds    Straight Leg Raises Strengthening;Left;10 reps  PT Education - 09/08/20 1028    Education Details PT POC, HEP     Person(s) Educated Patient    Methods Explanation;Demonstration;Handout;Tactile cues;Verbal cues    Comprehension Verbalized understanding;Returned demonstration            PT Short Term Goals - 09/08/20 1030      PT SHORT TERM GOAL #1   Title Pt will be independent in her initial HEP.    Time 3    Period Weeks    Status New    Target Date 10/01/20      PT SHORT TERM GOAL #2   Title Pt will be able to perform 5 time sit to stand in </= 12 seconds with no UE support.    Baseline 14.5 seconds with UE support.    Status New    Target Date 10/01/20             PT Long Term Goals - 09/08/20 1058      PT LONG TERM GOAL #1   Title Pt will be independent in her advanced HEP.    Time 8    Period Weeks    Status New    Target Date 11/05/20      PT LONG TERM GOAL #2   Title Pt will be able to perform left knee flexion to >/= 125 degrees to improve functional mobility.    Baseline 95 degrees actively on 09/08/2020    Time 8    Period Weeks    Status New    Target Date 11/05/20      PT LONG TERM GOAL #3   Title Pt will be able to walk community surfaces for >/= 30 minutes with no device with no pain reported.    Baseline pt amb household distances up to 10 minutes with straight cane and varying pain    Time 8    Period Weeks    Status New    Target Date 11/05/20      PT LONG TERM GOAL #4   Title Pt will improve her left LE knee flexio and extension to >/= 4+/5 in order to improve gait and functional mobility.    Baseline 4-/5 on 09/08/2020    Time 8    Period Weeks    Status New    Target Date 11/05/20      PT LONG TERM GOAL #5   Title Pt will be able to amb up and down 5 steps safely with no hand rail.    Time 8    Period Weeks    Status New    Target Date 11/05/20                  Plan - 09/08/20 1209    Clinical Impression Statement Pt arriving today for PT evalution s/p left TKA on 08/17/2020. Pt presenting with weakness and ROM deficits in her left  knee. Pt's active ROM range was 4-95 and passive ROM range was 0-100 degrees. Pt currently amb with straight cane with antalgic gait pattern with decrease stance and step length. 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 She is motivated and has been consistent with her HHPT exercises. Outpatient skilled PT needed to address pt's impairments with the below interventions in order to maximize functional mobility.    Personal Factors and Comorbidities Comorbidity 3+    Comorbidities COPD, arthritis, anemia, depression, HTN, neuromuscular disorder  Examination-Activity Limitations Lift;Squat;Stairs;Stand;Other;Bend    Examination-Participation Restrictions Driving;Other;Occupation;Community Activity    Stability/Clinical Decision Making Stable/Uncomplicated    Clinical Decision Making Low    Rehab Potential Excellent    PT Frequency 2x / week    PT Duration 8 weeks    PT Treatment/Interventions ADLs/Self Care Home Management;Cryotherapy;Electrical Stimulation;Moist Heat;Ultrasound;Gait training;Stair training;Functional mobility training;Therapeutic activities;Therapeutic exercise;Balance training;Patient/family education;Neuromuscular re-education;Manual techniques;Scar mobilization;Passive range of motion;Taping;Vasopneumatic Device    PT Next Visit Plan FOTO next visit and create LTG,     Nustep vs bike, L knee ROM and strengthening, gait training, vasopneumatic    PT Home Exercise Plan Access Code: [email or web address]  URL: [email or web address]  Date: 09/08/2020  Prepared by: Narda Amber    Exercises  Supine Bridge - 2 x daily - 7 x weekly - 2 sets - 10 reps  Straight Leg Raise - 2 x daily - 7 x weekly - 2 sets - 10 reps  Sit to Stand - 2 x daily - 7 x weekly - 2 sets - 10 reps  Seated Knee Flexion AAROM - 3 x daily - 7 x weekly - 2 sets - 10 reps  Heel Toe Raises with Counter Support - 2 x daily - 7 x weekly - 2 sets - 10 reps  Standing Heel  Raise with Support - 2 x daily - 7 x weekly - 2 sets - 10 reps  Standing Hip Abduction with Counter Support - 2 x daily - 7 x weekly - 2 sets - 10 reps  Long Sitting Quad Set with Towel Roll Under Heel - 2 x daily - 7 x weekly - 2 sets - 10 reps - 5 seconds hold  Supine Heel Slide with Strap - 2 x daily - 7 x weekly - 2 sets - 10 reps - 5 seconds hold    Consulted and Agree with Plan of Care Patient           Patient will benefit from skilled therapeutic intervention in order to improve the following deficits and impairments:  Pain,Difficulty walking,Decreased balance,Increased edema,Impaired flexibility,Decreased strength,Decreased range of motion,Decreased activity tolerance,Decreased mobility  Visit Diagnosis: Acute pain of left knee  Stiffness of left knee, not elsewhere classified  Localized edema  Difficulty in walking, not elsewhere classified     Problem List Patient Active Problem List   Diagnosis Date Noted  . Arthritis of left knee   . S/P total knee arthroplasty, left 08/17/2020    Sharmon Leyden, PT, MPT 09/08/2020, 12:19 PM  [facility] Physical Therapy [address] [place], [place], [address] Phone: [phone]   Fax:  [phone]  Name: Kristi Decker MRN: [id number] Date of Birth: [birth year]

## 2020-09-08 NOTE — Patient Instructions (Signed)
Access Code: [email or web address] URL: [email or web address] Date: 09/08/2020 Prepared by: Narda Amber  Exercises Supine Bridge - 2 x daily - 7 x weekly - 2 sets - 10 reps Straight Leg Raise - 2 x daily - 7 x weekly - 2 sets - 10 reps Sit to Stand - 2 x daily - 7 x weekly - 2 sets - 10 reps Seated Knee Flexion AAROM - 3 x daily - 7 x weekly - 2 sets - 10 reps Heel Toe Raises with Counter Support - 2 x daily - 7 x weekly - 2 sets - 10 reps Standing Heel Raise with Support - 2 x daily - 7 x weekly - 2 sets - 10 reps Standing Hip Abduction with Counter Support - 2 x daily - 7 x weekly - 2 sets - 10 reps Long Sitting Quad Set with Towel Roll Under Heel - 2 x daily - 7 x weekly - 2 sets - 10 reps - 5 seconds hold Supine Heel Slide with Strap - 2 x daily - 7 x weekly - 2 sets - 10 reps - 5 seconds hold

## 2020-09-08 NOTE — Telephone Encounter (Signed)
Called pt and sch 6/10

## 2020-09-08 NOTE — Telephone Encounter (Signed)
Pt returning a missed phone call

## 2020-09-09 ENCOUNTER — Encounter: Payer: Self-pay | Admitting: Rehabilitative and Restorative Service Providers"

## 2020-09-09 ENCOUNTER — Other Ambulatory Visit: Payer: Self-pay | Admitting: Surgical

## 2020-09-09 ENCOUNTER — Ambulatory Visit: Payer: 59 | Admitting: Rehabilitative and Restorative Service Providers"

## 2020-09-09 DIAGNOSIS — R600 Localized edema: Secondary | ICD-10-CM | POA: Diagnosis not present

## 2020-09-09 DIAGNOSIS — R262 Difficulty in walking, not elsewhere classified: Secondary | ICD-10-CM

## 2020-09-09 DIAGNOSIS — M25562 Pain in left knee: Secondary | ICD-10-CM | POA: Diagnosis not present

## 2020-09-09 DIAGNOSIS — M25662 Stiffness of left knee, not elsewhere classified: Secondary | ICD-10-CM | POA: Diagnosis not present

## 2020-09-09 NOTE — Therapy (Signed)
[facility] Physical Therapy [address] [place], [place], [address] Phone: [phone]   Fax:  [phone]  Physical Therapy Treatment  Patient Details  Name: Kristi Decker MRN: [id number] Date of Birth: May 25, 1975 Referring Provider (PT): August Saucer Corrie Mckusick, MD   Encounter Date: 09/09/2020   PT End of Session - 09/09/20 1012    Visit Number 2    Number of Visits 16    Date for PT Re-Evaluation 11/05/20    PT Start Time 1013    PT Stop Time 1103    PT Time Calculation (min) 50 min    Equipment Utilized During Treatment --    Activity Tolerance Patient tolerated treatment well    Behavior During Therapy [facility] for tasks assessed/performed           Past Medical History:  Diagnosis Date  . Anemia   . Anxiety   . Arthritis    left knee  . Asthma   . COPD (chronic obstructive pulmonary disease) (HCC)   . Depression   . GERD (gastroesophageal reflux disease)   . Hypertension   . Neuromuscular disorder (HCC)    right arm    Past Surgical History:  Procedure Laterality Date  . DILATION AND CURETTAGE OF UTERUS     2002  . TOTAL KNEE ARTHROPLASTY Left 08/17/2020   Procedure: LEFT TOTAL KNEE ARTHROPLASTY;  Surgeon: Cammy Copa, MD;  Location: [facility] OR;  Service: Orthopedics;  Laterality: Left;  . UPPER GI ENDOSCOPY    . WISDOM TOOTH EXTRACTION      There were no vitals filed for this visit.   Subjective Assessment - 09/09/20 1015    Subjective Pt. stated Lt leg doing ok today.  Pt. stated feeling complaints neck and Rt arm waking her up at night.    Pertinent History COPD, arthritis, anemia, depression, HTN, neuromuscular disorder    Limitations Standing;Walking;House hold activities    How long can you walk comfortably? 10 minutes with straight cane    Diagnostic tests X-ray    Patient Stated Goals I want to get back to my normal self.    Currently in Pain? Yes    Pain Score 1     Pain Location Knee    Pain Orientation Left    Pain  Descriptors / Indicators Aching;Sore    Pain Type Acute pain;Surgical pain    Pain Onset 1 to 4 weeks ago    Pain Frequency Intermittent    Aggravating Factors  bending, sleeping at times              [facility] PT Assessment - 09/09/20 0001      Observation/Other Assessments   Focus on Therapeutic Outcomes (FOTO)  intake 55%, outcome predicted 63 %                         [facility] Adult PT Treatment/Exercise - 09/09/20 0001      Knee/Hip Exercises: Aerobic   Recumbent Bike Seat 7, slow fwd for range 7 mins      Knee/Hip Exercises: Seated   Sit to Sand 10 reps;without UE support      Knee/Hip Exercises: Supine   Bridges 10 reps;Both;Other (comment)   5 sec hold   Other Supine Knee/Hip Exercises combination active heel slide, SLR 2 x 10 Lt      Modalities   Modalities Vasopneumatic      Vasopneumatic   Number Minutes Vasopneumatic  10 minutes    Vasopnuematic Location  Knee   Lt in TKE stretch on table   Vasopneumatic Pressure Medium    Vasopneumatic Temperature  34      Manual Therapy   Manual therapy comments seated Lt knee flexion, IR, distraction mobilization c movement passive c contralateral leg movement opposite                    PT Short Term Goals - 09/09/20 1016      PT SHORT TERM GOAL #1   Title Pt will be independent in her initial HEP.    Time 3    Period Weeks    Status On-going    Target Date 10/01/20      PT SHORT TERM GOAL #2   Title Pt will be able to perform 5 time sit to stand in </= 12 seconds with no UE support.    Baseline 14.5 seconds with UE support.    Status On-going    Target Date 10/01/20             PT Long Term Goals - 09/09/20 1050      PT LONG TERM GOAL #1   Title Pt will be independent in her advanced HEP.    Time 8    Period Weeks    Status On-going    Target Date 11/05/20      PT LONG TERM GOAL #2   Title Pt will be able to perform left knee flexion to >/= 125 degrees to improve functional  mobility.    Time 8    Period Weeks    Status On-going    Target Date 11/05/20      PT LONG TERM GOAL #3   Title Pt will be able to walk community surfaces for >/= 30 minutes with no device with no pain reported.    Time 8    Period Weeks    Status On-going    Target Date 11/05/20      PT LONG TERM GOAL #4   Title Pt will improve her left LE knee flexio and extension to >/= 4+/5 in order to improve gait and functional mobility.    Time 8    Period Weeks    Status On-going    Target Date 11/05/20      PT LONG TERM GOAL #5   Title Pt will be able to amb up and down 5 steps safely with no hand rail.    Time 8    Period Weeks    Status On-going    Target Date 11/05/20      Additional Long Term Goals   Additional Long Term Goals Yes      PT LONG TERM GOAL #6   Title FOTO outcome > or = 63    Time 8    Period Weeks    Target Date 11/05/20                 Plan - 09/09/20 1026    Clinical Impression Statement Very mild extension LAQ in SLR noted today (-5 range).  Cues given throughout exercise to avoid exacerbation of pain symptoms, instead focusing more on tightness stretch as a goal.  Overall doing well with initial start of treatment c good early mobility presentation.  Continued strengthening, flexion gains and balance to benefit patient.    Personal Factors and Comorbidities Comorbidity 3+    Comorbidities COPD, arthritis, anemia, depression, HTN, neuromuscular disorder    Examination-Activity Limitations Lift;Squat;Stairs;Stand;Other;Bend    Examination-Participation  Restrictions Driving;Other;Occupation;Community Activity    Stability/Clinical Decision Making Stable/Uncomplicated    Rehab Potential Excellent    PT Frequency 2x / week    PT Duration 8 weeks    PT Treatment/Interventions ADLs/Self Care Home Management;Cryotherapy;Electrical Stimulation;Moist Heat;Ultrasound;Gait training;Stair training;Functional mobility training;Therapeutic  activities;Therapeutic exercise;Balance training;Patient/family education;Neuromuscular re-education;Manual techniques;Scar mobilization;Passive range of motion;Taping;Vasopneumatic Device    PT Next Visit Plan Continued manual intervention to benefit flexion mobility/symptoms.  Quad strengthening in open chain and introduction of closed chain (leg press, stair).  Early static balance intervention    PT Home Exercise Plan Access Code: [email or web address]  URL: [email or web address]  Date: 09/08/2020  Prepared by: Narda Amber    Exercises  Supine Bridge - 2 x daily - 7 x weekly - 2 sets - 10 reps  Straight Leg Raise - 2 x daily - 7 x weekly - 2 sets - 10 reps  Sit to Stand - 2 x daily - 7 x weekly - 2 sets - 10 reps  Seated Knee Flexion AAROM - 3 x daily - 7 x weekly - 2 sets - 10 reps  Heel Toe Raises with Counter Support - 2 x daily - 7 x weekly - 2 sets - 10 reps  Standing Heel Raise with Support - 2 x daily - 7 x weekly - 2 sets - 10 reps  Standing Hip Abduction with Counter Support - 2 x daily - 7 x weekly - 2 sets - 10 reps  Long Sitting Quad Set with Towel Roll Under Heel - 2 x daily - 7 x weekly - 2 sets - 10 reps - 5 seconds hold  Supine Heel Slide with Strap - 2 x daily - 7 x weekly - 2 sets - 10 reps - 5 seconds hold    Consulted and Agree with Plan of Care Patient           Patient will benefit from skilled therapeutic intervention in order to improve the following deficits and impairments:  Pain,Difficulty walking,Decreased balance,Increased edema,Impaired flexibility,Decreased strength,Decreased range of motion,Decreased activity tolerance,Decreased mobility  Visit Diagnosis: Acute pain of left knee  Stiffness of left knee, not elsewhere classified  Localized edema  Difficulty in walking, not elsewhere classified     Problem List Patient Active Problem List   Diagnosis Date Noted  . Arthritis of left knee   . S/P total knee arthroplasty, left 08/17/2020    Chyrel Masson, PT, DPT, OCS, ATC 09/09/20  10:55 AM    [facility] Physical Therapy [address] [place], [place], [address] Phone: [phone]   Fax:  [phone]  Name: Kristi Decker MRN: [id number] Date of Birth: [birth year]

## 2020-09-14 ENCOUNTER — Other Ambulatory Visit: Payer: Self-pay

## 2020-09-14 ENCOUNTER — Ambulatory Visit (INDEPENDENT_AMBULATORY_CARE_PROVIDER_SITE_OTHER): Payer: 59 | Admitting: Physical Therapy

## 2020-09-14 ENCOUNTER — Encounter: Payer: Self-pay | Admitting: Physical Therapy

## 2020-09-14 DIAGNOSIS — R600 Localized edema: Secondary | ICD-10-CM | POA: Diagnosis not present

## 2020-09-14 DIAGNOSIS — M25662 Stiffness of left knee, not elsewhere classified: Secondary | ICD-10-CM

## 2020-09-14 DIAGNOSIS — M25562 Pain in left knee: Secondary | ICD-10-CM | POA: Diagnosis not present

## 2020-09-14 DIAGNOSIS — R262 Difficulty in walking, not elsewhere classified: Secondary | ICD-10-CM

## 2020-09-14 NOTE — Therapy (Signed)
[facility] [address] [place], [place], [id number] Phone: [phone]   Fax:  [phone]  Physical Therapy Treatment  Patient Details  Name: Kristi AstValerie Decker MRN: [id number] Date of Birth: 1975/12/19 Referring Provider (PT): August Saucerean, Corrie MckusickGregory Scott, MD   Encounter Date: 09/14/2020   PT End of Session - 09/14/20 1121    Visit Number 3    Number of Visits 16    Date for PT Re-Evaluation 11/05/20    Authorization Type [employer]    PT Start Time 1015    PT Stop Time 1110    PT Time Calculation (min) 55 min    Activity Tolerance Patient tolerated treatment well    Behavior [facility] for tasks assessed/performed           Past Medical History:  Diagnosis Date  . Anemia   . Anxiety   . Arthritis    left knee  . Asthma   . COPD (chronic obstructive pulmonary disease) (HCC)   . Depression   . GERD (gastroesophageal reflux disease)   . Hypertension   . Neuromuscular disorder (HCC)    right arm    Past Surgical History:  Procedure Laterality Date  . DILATION AND CURETTAGE OF UTERUS     2002  . TOTAL KNEE ARTHROPLASTY Left 08/17/2020   Procedure: LEFT TOTAL KNEE ARTHROPLASTY;  Surgeon: Cammy Copaean, Gregory Scott, MD;  [facility];  Service: Orthopedics;  Laterality: Left;  . UPPER GI ENDOSCOPY    . WISDOM TOOTH EXTRACTION      There were no vitals filed for this visit.   Subjective Assessment - 09/14/20 1118    Subjective Pt reports L knee is feeling much better. Pt reports neck and R UE pain prevents her from sleeping at night.    Pertinent History COPD, arthritis, anemia, depression, HTN, neuromuscular disorder    Limitations Standing;Walking;House hold activities    Patient Stated Goals I want to get back to my normal self.    Currently in Pain? Yes    Pain Score 1     Pain Location Knee    Pain Orientation Left    Pain Descriptors / Indicators Aching;Sore    Pain Type Acute pain;Surgical pain    Pain Onset 1 to 4  weeks ago    Pain Frequency Intermittent    Pain Relieving Factors ice              [facility] PT Assessment - 09/14/20 0001      AROM   Left Knee Flexion 103                         [facility] Adult PT Treatment/Exercise - 09/14/20 0001      Knee/Hip Exercises: Aerobic   Recumbent Bike Seat 6, slow fwd for range 7 mins      Knee/Hip Exercises: Standing   Knee Flexion 20 reps    Knee Flexion Limitations no UE support    Forward Step Up 15 reps;Step Height: 6";Hand Hold: 1    Forward Step Up Limitations 1 hand hold for descending    Other Standing Knee Exercises Tandem stance 30 sec x2 bil;    Other Standing Knee Exercises Walk/march in // bars x4;      Knee/Hip Exercises: Seated   Long Arc Quad 20 reps    Long Arc Quad Limitations 2.5 lb    Sit to [employer] CorporationSand 10 reps;without UE support      Knee/Hip  Exercises: Supine   Quad Sets Limitations review for HEP    Heel Slides 10 reps    Heel Slides Limitations with strap    Bridges --    Straight Leg Raises 20 reps    Other Supine Knee/Hip Exercises --      Modalities   Modalities Vasopneumatic      Vasopneumatic   Number Minutes Vasopneumatic  10 minutes    Vasopnuematic Location  Knee   Lt in TKE stretch on table   Vasopneumatic Pressure Medium    Vasopneumatic Temperature  34      Manual Therapy   Manual therapy comments Supine and prone PROM for knee flexion                  PT Education - 09/14/20 1129    Education Details Reviewed HEP, recommended cane use for longer distances.    Person(s) Educated Patient    Methods Explanation;Demonstration;Tactile cues;Verbal cues;Handout    Comprehension Verbalized understanding;Verbal cues required;Returned demonstration;Tactile cues required;Need further instruction            PT Short Term Goals - 09/09/20 1016      PT SHORT TERM GOAL #1   Title Pt will be independent in her initial HEP.    Time 3    Period Weeks    Status On-going    Target Date  10/01/20      PT SHORT TERM GOAL #2   Title Pt will be able to perform 5 time sit to stand in </= 12 seconds with no UE support.    Baseline 14.5 seconds with UE support.    Status On-going    Target Date 10/01/20             PT Long Term Goals - 09/09/20 1050      PT LONG TERM GOAL #1   Title Pt will be independent in her advanced HEP.    Time 8    Period Weeks    Status On-going    Target Date 11/05/20      PT LONG TERM GOAL #2   Title Pt will be able to perform left knee flexion to >/= 125 degrees to improve functional mobility.    Time 8    Period Weeks    Status On-going    Target Date 11/05/20      PT LONG TERM GOAL #3   Title Pt will be able to walk community surfaces for >/= 30 minutes with no device with no pain reported.    Time 8    Period Weeks    Status On-going    Target Date 11/05/20      PT LONG TERM GOAL #4   Title Pt will improve her left LE knee flexio and extension to >/= 4+/5 in order to improve gait and functional mobility.    Time 8    Period Weeks    Status On-going    Target Date 11/05/20      PT LONG TERM GOAL #5   Title Pt will be able to amb up and down 5 steps safely with no hand rail.    Time 8    Period Weeks    Status On-going    Target Date 11/05/20      Additional Long Term Goals   Additional Long Term Goals Yes      PT LONG TERM GOAL #6   Title FOTO outcome > or = 63    Time 8  Period Weeks    Target Date 11/05/20                 Plan - 09/14/20 1123    Clinical Impression Statement Pt tolerated treatment well and demonstrated increase in L knee passive flexion. Pt demonstrated minor instability in L knee with performing stair training. Pt demonstrated some difficulty with balance exercises at paralel bars. Pt able to safely ambulate without SPC, but needs cues for shorter step length on L, and for decreasing lateral trunk motion. Improved mechanics after education and practice today. Pt to benefit from further  PT for increased L knee AROM, increased quad strengthening for greater stability, and balance training.    Personal Factors and Comorbidities Comorbidity 3+    Comorbidities COPD, arthritis, anemia, depression, HTN, neuromuscular disorder    Examination-Activity Limitations Lift;Squat;Stairs;Stand;Other;Bend    Examination-Participation Restrictions Driving;Other;Occupation;Community Activity    Stability/Clinical Decision Making Stable/Uncomplicated    Clinical Decision Making Low    Rehab Potential Excellent    PT Frequency 2x / week    PT Duration 8 weeks    PT Treatment/Interventions ADLs/Self Care Home Management;Cryotherapy;Electrical Stimulation;Moist Heat;Ultrasound;Gait training;Stair training;Functional mobility training;Therapeutic activities;Therapeutic exercise;Balance training;Patient/family education;Neuromuscular re-education;Manual techniques;Scar mobilization;Passive range of motion;Taping;Vasopneumatic Device    PT Next Visit Plan Continue manual interventions to increase PROM. Continue balance exercises and stair training. Exercise at leg press to increase quad strength.    PT Home Exercise Plan Access Code: [email or web address]  URL: [email or web address]  Date: 09/08/2020  Prepared by: Narda Amber    Exercises  Supine Bridge - 2 x daily - 7 x weekly - 2 sets - 10 reps  Straight Leg Raise - 2 x daily - 7 x weekly - 2 sets - 10 reps  Sit to Stand - 2 x daily - 7 x weekly - 2 sets - 10 reps  Seated Knee Flexion AAROM - 3 x daily - 7 x weekly - 2 sets - 10 reps  Heel Toe Raises with Counter Support - 2 x daily - 7 x weekly - 2 sets - 10 reps  Standing Heel Raise with Support - 2 x daily - 7 x weekly - 2 sets - 10 reps  Standing Hip Abduction with Counter Support - 2 x daily - 7 x weekly - 2 sets - 10 reps  Long Sitting Quad Set with Towel Roll Under Heel - 2 x daily - 7 x weekly - 2 sets - 10 reps - 5 seconds hold  Supine Heel Slide with Strap - 2 x daily - 7 x weekly - 2 sets -  10 reps - 5 seconds hold    Consulted and Agree with Plan of Care Patient           Patient will benefit from skilled therapeutic intervention in order to improve the following deficits and impairments:  Pain,Difficulty walking,Decreased balance,Increased edema,Impaired flexibility,Decreased strength,Decreased range of motion,Decreased activity tolerance,Decreased mobility  Visit Diagnosis: Acute pain of left knee  Stiffness of left knee, not elsewhere classified  Localized edema  Difficulty in walking, not elsewhere classified     Problem List Patient Active Problem List   Diagnosis Date Noted  . Arthritis of left knee   . S/P total knee arthroplasty, left 08/17/2020    Laureen Ochs, SPT  Sedalia Muta, PT, DPT 11:34 AM  09/14/20   This entire session was performed under direct supervision and direction of a licensed therapist/therapist assistant . I have personally read, edited and approve of the note as  written.      [facility] [address] [place], [place], [address] Phone: [phone]   Fax:  [phone]  Name: Quanetta Truss MRN: [id number] Date of Birth: 05-19-1975

## 2020-09-15 ENCOUNTER — Encounter: Payer: Self-pay | Admitting: Family Medicine

## 2020-09-16 ENCOUNTER — Encounter: Payer: Self-pay | Admitting: Rehabilitative and Restorative Service Providers"

## 2020-09-16 ENCOUNTER — Other Ambulatory Visit: Payer: Self-pay | Admitting: Surgical

## 2020-09-16 ENCOUNTER — Ambulatory Visit (INDEPENDENT_AMBULATORY_CARE_PROVIDER_SITE_OTHER): Payer: 59 | Admitting: Rehabilitative and Restorative Service Providers"

## 2020-09-16 ENCOUNTER — Other Ambulatory Visit: Payer: Self-pay

## 2020-09-16 DIAGNOSIS — R262 Difficulty in walking, not elsewhere classified: Secondary | ICD-10-CM | POA: Diagnosis not present

## 2020-09-16 DIAGNOSIS — M25562 Pain in left knee: Secondary | ICD-10-CM | POA: Diagnosis not present

## 2020-09-16 DIAGNOSIS — M25662 Stiffness of left knee, not elsewhere classified: Secondary | ICD-10-CM

## 2020-09-16 DIAGNOSIS — R600 Localized edema: Secondary | ICD-10-CM

## 2020-09-16 NOTE — Therapy (Signed)
[facility] [address] [place], [place], [address] Phone: [phone]   Fax:  [phone]  Physical Therapy Treatment  Patient Details  Name: Kristi Decker MRN: [id number] Date of Birth: 1975/10/26 Referring Provider (PT): August Saucer Corrie Mckusick, MD   Encounter Date: 09/16/2020   PT End of Session - 09/16/20 0851    Visit Number 4    Number of Visits 16    Date for PT Re-Evaluation 11/05/20    Authorization Type [employer]    Progress Note Due on Visit 10    PT Start Time 0845    PT Stop Time 0930    PT Time Calculation (min) 45 min    Activity Tolerance Patient tolerated treatment well    Behavior During Therapy [facility] for tasks assessed/performed           Past Medical History:  Diagnosis Date  . Anemia   . Anxiety   . Arthritis    left knee  . Asthma   . COPD (chronic obstructive pulmonary disease) (HCC)   . Depression   . GERD (gastroesophageal reflux disease)   . Hypertension   . Neuromuscular disorder (HCC)    right arm    Past Surgical History:  Procedure Laterality Date  . DILATION AND CURETTAGE OF UTERUS     2002  . TOTAL KNEE ARTHROPLASTY Left 08/17/2020   Procedure: LEFT TOTAL KNEE ARTHROPLASTY;  Surgeon: Cammy Copa, MD;  [facility];  Service: Orthopedics;  Laterality: Left;  . UPPER GI ENDOSCOPY    . WISDOM TOOTH EXTRACTION      There were no vitals filed for this visit.   Subjective Assessment - 09/16/20 0848    Subjective Pt. indicated 1-2/10 pain with walking today.  Felt tired overall today upon arrival.    Pertinent History COPD, arthritis, anemia, depression, HTN, neuromuscular disorder    Limitations Standing;Walking;House hold activities    Currently in Pain? Yes    Pain Location Knee    Pain Orientation Left    Pain Descriptors / Indicators Aching;Sore    Pain Type Surgical pain    Pain Onset More than a month ago    Pain Frequency Intermittent    Aggravating Factors   stiffness/bending to start    Pain Relieving Factors ice, rest                             [facility] Adult PT Treatment/Exercise - 09/16/20 0001      Neuro Re-ed    Neuro Re-ed Details  tandem ambulation fwd/back on foam in bars 8 ft x 6 each way, tandem stance on foam 1 min x 1 bilateral      Knee/Hip Exercises: Stretches   Gastroc Stretch 30 seconds;3 reps;Left   incline board runner stretch c knee extension cues   Other Knee/Hip Stretches supine knee extension static stretch c vaso 10 mins      Knee/Hip Exercises: Aerobic   Recumbent Bike Seat 6, full revolutions backward for 1 min then forward for 5 mins lvl 2      Knee/Hip Exercises: Machines for Strengthening   Cybex Knee Extension Eccentric lowering Lt leg 3 x 10 5 lbs   pin hole 3rd from back     Knee/Hip Exercises: Standing   Forward Step Up 2 sets;10 reps;Both;Step Height: 6"      Vasopneumatic   Number Minutes Vasopneumatic  10 minutes    Vasopnuematic Location  Knee    Vasopneumatic Pressure Medium    Vasopneumatic Temperature  34      Manual Therapy   Manual therapy comments seated Lt knee flexion, distraction, IR mobilizations c movement c contralateral leg movement opposite                    PT Short Term Goals - 09/09/20 1016      PT SHORT TERM GOAL #1   Title Pt will be independent in her initial HEP.    Time 3    Period Weeks    Status On-going    Target Date 10/01/20      PT SHORT TERM GOAL #2   Title Pt will be able to perform 5 time sit to stand in </= 12 seconds with no UE support.    Baseline 14.5 seconds with UE support.    Status On-going    Target Date 10/01/20             PT Long Term Goals - 09/09/20 1050      PT LONG TERM GOAL #1   Title Pt will be independent in her advanced HEP.    Time 8    Period Weeks    Status On-going    Target Date 11/05/20      PT LONG TERM GOAL #2   Title Pt will be able to perform left knee flexion to >/= 125 degrees to  improve functional mobility.    Time 8    Period Weeks    Status On-going    Target Date 11/05/20      PT LONG TERM GOAL #3   Title Pt will be able to walk community surfaces for >/= 30 minutes with no device with no pain reported.    Time 8    Period Weeks    Status On-going    Target Date 11/05/20      PT LONG TERM GOAL #4   Title Pt will improve her left LE knee flexio and extension to >/= 4+/5 in order to improve gait and functional mobility.    Time 8    Period Weeks    Status On-going    Target Date 11/05/20      PT LONG TERM GOAL #5   Title Pt will be able to amb up and down 5 steps safely with no hand rail.    Time 8    Period Weeks    Status On-going    Target Date 11/05/20      Additional Long Term Goals   Additional Long Term Goals Yes      PT LONG TERM GOAL #6   Title FOTO outcome > or = 63    Time 8    Period Weeks    Target Date 11/05/20                 Plan - 09/16/20 0917    Clinical Impression Statement Fatigue noted in extension based strengthening but overall performed well in activity today c progressing balance control and flexion mobility.  Continued skilled PT services.    Personal Factors and Comorbidities Comorbidity 3+    Comorbidities COPD, arthritis, anemia, depression, HTN, neuromuscular disorder    Examination-Activity Limitations Lift;Squat;Stairs;Stand;Other;Bend    Examination-Participation Restrictions Driving;Other;Occupation;Community Activity    Stability/Clinical Decision Making Stable/Uncomplicated    Rehab Potential Excellent    PT Frequency 2x / week    PT Duration 8 weeks    PT Treatment/Interventions ADLs/Self  Care Home Management;Cryotherapy;Electrical Stimulation;Moist Heat;Ultrasound;Gait training;Stair training;Functional mobility training;Therapeutic activities;Therapeutic exercise;Balance training;Patient/family education;Neuromuscular re-education;Manual techniques;Scar mobilization;Passive range of  motion;Taping;Vasopneumatic Device    PT Next Visit Plan Continued balance intervention on compliant and non compliant surfaces, progressive strengthening c machine work.    PT Home Exercise Plan Access Code: [email or web address]  URL: [email or web address]  Date: 09/08/2020  Prepared by: Narda Amber    Exercises  Supine Bridge - 2 x daily - 7 x weekly - 2 sets - 10 reps  Straight Leg Raise - 2 x daily - 7 x weekly - 2 sets - 10 reps  Sit to Stand - 2 x daily - 7 x weekly - 2 sets - 10 reps  Seated Knee Flexion AAROM - 3 x daily - 7 x weekly - 2 sets - 10 reps  Heel Toe Raises with Counter Support - 2 x daily - 7 x weekly - 2 sets - 10 reps  Standing Heel Raise with Support - 2 x daily - 7 x weekly - 2 sets - 10 reps  Standing Hip Abduction with Counter Support - 2 x daily - 7 x weekly - 2 sets - 10 reps  Long Sitting Quad Set with Towel Roll Under Heel - 2 x daily - 7 x weekly - 2 sets - 10 reps - 5 seconds hold  Supine Heel Slide with Strap - 2 x daily - 7 x weekly - 2 sets - 10 reps - 5 seconds hold    Consulted and Agree with Plan of Care Patient           Patient will benefit from skilled therapeutic intervention in order to improve the following deficits and impairments:  Pain,Difficulty walking,Decreased balance,Increased edema,Impaired flexibility,Decreased strength,Decreased range of motion,Decreased activity tolerance,Decreased mobility  Visit Diagnosis: Acute pain of left knee  Stiffness of left knee, not elsewhere classified  Localized edema  Difficulty in walking, not elsewhere classified     Problem List Patient Active Problem List   Diagnosis Date Noted  . Arthritis of left knee   . S/P total knee arthroplasty, left 08/17/2020   Chyrel Masson, PT, DPT, OCS, ATC 09/16/20  9:26 AM    [facility] Physical Therapy [address] [place], [place], [address] Phone: [phone]   Fax:  [phone]  Name: Kristi Decker MRN: [id number] Date of  Birth: [birth year]

## 2020-09-17 ENCOUNTER — Other Ambulatory Visit (HOSPITAL_COMMUNITY)
Admission: RE | Admit: 2020-09-17 | Discharge: 2020-09-17 | Disposition: A | Discharge status: Home / Self Care | Payer: 59 | Source: Ambulatory Visit | Attending: Medical | Admitting: Medical

## 2020-09-17 ENCOUNTER — Ambulatory Visit (INDEPENDENT_AMBULATORY_CARE_PROVIDER_SITE_OTHER): Payer: 59 | Admitting: Medical

## 2020-09-17 ENCOUNTER — Encounter: Payer: Self-pay | Admitting: Medical

## 2020-09-17 VITALS — BP 120/84 | HR 84 | Wt 217.0 lb

## 2020-09-17 DIAGNOSIS — Z01419 Encounter for gynecological examination (general) (routine) without abnormal findings: Secondary | ICD-10-CM | POA: Diagnosis present

## 2020-09-17 DIAGNOSIS — J4540 Moderate persistent asthma, uncomplicated: Secondary | ICD-10-CM | POA: Insufficient documentation

## 2020-09-17 NOTE — Progress Notes (Signed)
History:  Kristi Decker is a 45 y.o. [id number] who presents to clinic today for annual exam and pap smear. She also has concerns about hot flashes related to menopause. She states last period was at least 1 year ago. She denies any spotting, abnormal discharge or pelvic pain. She states frequent hot flashes, worse at night. She has tried some OTC remedies without relief. She is unsure about HRT at this time as she doesn't feel this is affecting her ADLs. She states a history of abnormal pap smear with +HPV in 2009 treated with cryotherapy. Since then all pap smears have been normal. Last pap smear 06/22/2016 was normal. Chronic medical problems are followed by her PCP.   The following portions of the patient's history were reviewed and updated as appropriate: allergies, current medications, family history, past medical history, social history, past surgical history and problem list.  Review of Systems:  Review of Systems  Constitutional: Negative for chills, fever and malaise/fatigue.  Gastrointestinal: Negative for abdominal pain.  Genitourinary:       Neg - vaginal bleeding, discharge, pain       Objective:  Physical Exam BP 120/84   Pulse 84   Wt 217 lb (98.4 kg)   LMP 05/16/2019   BMI 33.99 kg/m  Physical Exam Vitals reviewed. Exam conducted with a chaperone present.  Constitutional:      General: She is not in acute distress.    Appearance: Normal appearance. She is obese. She is not ill-appearing.  HENT:     Head: Normocephalic.  Eyes:     Extraocular Movements: Extraocular movements intact.     Conjunctiva/sclera: Conjunctivae normal.  Cardiovascular:     Rate and Rhythm: Normal rate and regular rhythm.     Heart sounds: No murmur heard.   Pulmonary:     Effort: Pulmonary effort is normal. No respiratory distress.     Breath sounds: Normal breath sounds. No wheezing.  Abdominal:     General: Abdomen is flat. Bowel sounds are normal. There is no distension.      Palpations: Abdomen is soft. There is no mass.     Tenderness: There is no abdominal tenderness. There is no guarding or rebound.  Genitourinary:    General: Normal vulva.     Labia:        Right: No rash, tenderness or lesion.        Left: No rash, tenderness or lesion.      Vagina: No vaginal discharge, erythema, bleeding or prolapsed vaginal walls.     Cervix: No cervical motion tenderness, discharge or cervical bleeding.     Uterus: Normal.      Adnexa: Right adnexa normal and left adnexa normal.  Musculoskeletal:     Cervical back: Normal range of motion and neck supple.  Skin:    General: Skin is warm and dry.     Findings: No erythema.  Neurological:     Mental Status: She is alert and oriented to person, place, and time.  Psychiatric:        Mood and Affect: Mood normal.        Behavior: Behavior normal.        Thought Content: Thought content normal.        Judgment: Judgment normal.     Assessment & Plan:  1. Well woman exam with routine gynecological exam - Cytology - PAP( [place]) - Advised to trial Vitamin E supplement for hot flashes and make an appointment if  considering HRT with MD - Return PRN or for next pap smear   Approximately 20 minutes of total time was spent with this patient on history taking, patient education and documentation.   Marny Lowenstein, PA-C 09/17/2020 12:03 PM

## 2020-09-17 NOTE — Patient Instructions (Signed)
https://www.womenshealth.gov/menopause/menopause-basics"> https://www.clinicalkey.com">  Menopause Menopause is the normal time of a woman's life when menstrual periods stop completely. It marks the natural end to a woman's ability to become pregnant. It can be defined as the absence of a menstrual period for 12 months without another medical cause. The transition to menopause (perimenopause) most often happens between the ages of 45 and 55, and can last for many years. During perimenopause, hormone levels change in your body, which can cause symptoms and affect your health. Menopause may increase your risk for:  Weakened bones (osteoporosis), which causes fractures.  Depression.  Hardening and narrowing of the arteries (atherosclerosis), which can cause heart attacks and strokes. What are the causes? This condition is usually caused by a natural change in hormone levels that happens as you get older. The condition may also be caused by changes that are not natural, including:  Surgery to remove both ovaries (surgical menopause).  Side effects from some medicines, such as chemotherapy used to treat cancer (chemical menopause). What increases the risk? This condition is more likely to start at an earlier age if you have certain medical conditions or have undergone treatments, including:  A tumor of the pituitary gland in the brain.  A disease that affects the ovaries and hormones.  Certain cancer treatments, such as chemotherapy or hormone therapy, or radiation therapy on the pelvis.  Heavy smoking and excessive alcohol use.  Family history of early menopause. This condition is also more likely to develop earlier in women who are very thin. What are the signs or symptoms? Symptoms of this condition include:  Hot flashes.  Irregular menstrual periods.  Night sweats.  Changes in feelings about sex. This could be a decrease in sex drive or an increased discomfort around your  sexuality.  Vaginal dryness and thinning of the vaginal walls. This may cause painful sex.  Dryness of the skin and development of wrinkles.  Headaches.  Problems sleeping (insomnia).  Mood swings or irritability.  Memory problems.  Weight gain.  Hair growth on the face and chest.  Bladder infections or problems with urinating. How is this diagnosed? This condition is diagnosed based on your medical history, a physical exam, your age, your menstrual history, and your symptoms. Hormone tests may also be done. How is this treated? In some cases, no treatment is needed. You and your health care provider should make a decision together about whether treatment is necessary. Treatment will be based on your individual condition and preferences. Treatment for this condition focuses on managing symptoms. Treatment may include:  Menopausal hormone therapy (MHT).  Medicines to treat specific symptoms or complications.  Acupuncture.  Vitamin or herbal supplements. Before starting treatment, make sure to let your health care provider know if you have a personal or family history of these conditions:  Heart disease.  Breast cancer.  Blood clots.  Diabetes.  Osteoporosis. Follow these instructions at home: Lifestyle  Do not use any products that contain nicotine or tobacco, such as cigarettes, e-cigarettes, and chewing tobacco. If you need help quitting, ask your health care provider.  Get at least 30 minutes of physical activity on 5 or more days each week.  Avoid alcoholic and caffeinated beverages, as well as spicy foods. This may help prevent hot flashes.  Get 7-8 hours of sleep each night.  If you have hot flashes, try: ? Dressing in layers. ? Avoiding things that may trigger hot flashes, such as spicy food, warm places, or stress. ? Taking slow, deep   breaths when a hot flash starts. ? Keeping a fan in your home and office.  Find ways to manage stress, such as deep  breathing, meditation, or journaling.  Consider going to group therapy with other women who are having menopause symptoms. Ask your health care provider about recommended group therapy meetings. Eating and drinking  Eat a healthy, balanced diet that contains whole grains, lean protein, low-fat dairy, and plenty of fruits and vegetables.  Your health care provider may recommend adding more soy to your diet. Foods that contain soy include tofu, tempeh, and soy milk.  Eat plenty of foods that contain calcium and vitamin D for bone health. Items that are rich in calcium include low-fat milk, yogurt, beans, almonds, sardines, broccoli, and kale.   Medicines  Take over-the-counter and prescription medicines only as told by your health care provider.  Talk with your health care provider before starting any herbal supplements. If prescribed, take vitamins and supplements as told by your health care provider. General instructions  Keep track of your menstrual periods, including: ? When they occur. ? How heavy they are and how long they last. ? How much time passes between periods.  Keep track of your symptoms, noting when they start, how often you have them, and how long they last.  Use vaginal lubricants or moisturizers to help with vaginal dryness and improve comfort during sex.  Keep all follow-up visits. This is important. This includes any group therapy or counseling.   Contact a health care provider if:  You are still having menstrual periods after age 55.  You have pain during sex.  You have not had a period for 12 months and you develop vaginal bleeding. Get help right away if you have:  Severe depression.  Excessive vaginal bleeding.  Pain when you urinate.  A fast or irregular heartbeat (palpitations).  Severe headaches.  Abdominal pain or severe indigestion. Summary  Menopause is a normal time of life when menstrual periods stop completely. It is usually defined as  the absence of a menstrual period for 12 months without another medical cause.  The transition to menopause (perimenopause) most often happens between the ages of 45 and 55 and can last for several years.  Symptoms can be managed through medicines, lifestyle changes, and complementary therapies such as acupuncture.  Eat a balanced diet that is rich in nutrients to promote bone health and heart health and to manage symptoms during menopause. This information is not intended to replace advice given to you by your health care provider. Make sure you discuss any questions you have with your health care provider. Document Revised: 01/30/2020 Document Reviewed: 10/16/2019 Elsevier Patient Education  2021 Elsevier Inc.  

## 2020-09-21 ENCOUNTER — Encounter: Payer: Self-pay | Admitting: Physical Therapy

## 2020-09-21 ENCOUNTER — Other Ambulatory Visit: Payer: Self-pay

## 2020-09-21 ENCOUNTER — Ambulatory Visit: Payer: 59 | Admitting: Physical Therapy

## 2020-09-21 DIAGNOSIS — M25662 Stiffness of left knee, not elsewhere classified: Secondary | ICD-10-CM | POA: Diagnosis not present

## 2020-09-21 DIAGNOSIS — M25562 Pain in left knee: Secondary | ICD-10-CM

## 2020-09-21 DIAGNOSIS — R262 Difficulty in walking, not elsewhere classified: Secondary | ICD-10-CM | POA: Diagnosis not present

## 2020-09-21 DIAGNOSIS — R600 Localized edema: Secondary | ICD-10-CM

## 2020-09-21 LAB — CYTOLOGY - PAP
Comment: NEGATIVE
Diagnosis: NEGATIVE
High risk HPV: NEGATIVE

## 2020-09-21 NOTE — Therapy (Signed)
[facility] [address] [place], [place], [address] Phone: [phone]   Fax:  [phone]  Physical Therapy Treatment  Patient Details  Name: Kristi Decker MRN: [id number] Date of Birth: Apr 13, 1976 Referring Provider (PT): August Saucer Corrie Mckusick, MD   Encounter Date: 09/21/2020   PT End of Session - 09/21/20 1316    Visit Number 5    Number of Visits 16    Date for PT Re-Evaluation 11/05/20    Authorization Type [employer]    Progress Note Due on Visit 10    PT Start Time 1303    PT Stop Time 1345    PT Time Calculation (min) 42 min    Equipment Utilized During Treatment Gait belt    Activity Tolerance Patient tolerated treatment well    Behavior During Therapy [facility] for tasks assessed/performed           Past Medical History:  Diagnosis Date  . Anemia   . Anxiety   . Arthritis    left knee  . Asthma   . COPD (chronic obstructive pulmonary disease) (HCC)   . Depression   . GERD (gastroesophageal reflux disease)   . Hypertension   . Neuromuscular disorder (HCC)    right arm    Past Surgical History:  Procedure Laterality Date  . DILATION AND CURETTAGE OF UTERUS     2002  . TOTAL KNEE ARTHROPLASTY Left 08/17/2020   Procedure: LEFT TOTAL KNEE ARTHROPLASTY;  Surgeon: Cammy Copa, MD;  Location: [facility] OR;  Service: Orthopedics;  Laterality: Left;  . UPPER GI ENDOSCOPY    . WISDOM TOOTH EXTRACTION      There were no vitals filed for this visit.   Subjective Assessment - 09/21/20 1315    Subjective Pt arriving reporting 1-2/10 pain. in left knee.    Limitations Standing;Walking;House hold activities    How long can you walk comfortably? 10 minutes with straight cane    Currently in Pain? Yes    Pain Score 2     Pain Location Knee    Pain Orientation Left    Pain Descriptors / Indicators Aching    Pain Type Surgical pain    Pain Onset More than a month ago                             [facility]  Adult PT Treatment/Exercise - 09/21/20 0001      Neuro Re-ed    Neuro Re-ed Details  tandum stance on airex, SLS: holding 15 seconds with intermittent UE support      Knee/Hip Exercises: Aerobic   Recumbent Bike seat 7, full revolution x 8 minutes      Knee/Hip Exercises: Machines for Strengthening   Cybex Knee Extension Eccentric lowering Lt leg 3 x 10  10 lbs   pin hole 3rd from back     Knee/Hip Exercises: Standing   Forward Step Up 2 sets;10 reps;Both;Step Height: 6"      Modalities   Modalities Vasopneumatic      Vasopneumatic   Number Minutes Vasopneumatic  10 minutes    Vasopnuematic Location  Knee    Vasopneumatic Pressure Medium    Vasopneumatic Temperature  34      Manual Therapy   Manual therapy comments seated L knee contract/relax with opposite motion on contralateral side   5 minutes  PT Short Term Goals - 09/09/20 1016      PT SHORT TERM GOAL #1   Title Pt will be independent in her initial HEP.    Time 3    Period Weeks    Status On-going    Target Date 10/01/20      PT SHORT TERM GOAL #2   Title Pt will be able to perform 5 time sit to stand in </= 12 seconds with no UE support.    Baseline 14.5 seconds with UE support.    Status On-going    Target Date 10/01/20             PT Long Term Goals - 09/21/20 1335      PT LONG TERM GOAL #1   Title Pt will be independent in her advanced HEP.    Status On-going      PT LONG TERM GOAL #2   Title Pt will be able to perform left knee flexion to >/= 125 degrees to improve functional mobility.    Baseline 105 degrees on 09/21/2020.    Status On-going      PT LONG TERM GOAL #3   Title Pt will be able to walk community surfaces for >/= 30 minutes with no device with no pain reported.    Baseline pt amb household distances up to 10 minutes with straight cane and varying pain    Status On-going      PT LONG TERM GOAL #4   Title Pt will improve her left LE knee flexio and  extension to >/= 4+/5 in order to improve gait and functional mobility.    Status On-going      PT LONG TERM GOAL #5   Title Pt will be able to amb up and down 5 steps safely with no hand rail.    Status On-going      PT LONG TERM GOAL #6   Title FOTO outcome > or = 63    Status On-going                 Plan - 09/21/20 1318    Clinical Impression Statement Pt tolerating exercises wtih fatigue in left quad following contract/relax and cybex machine exercises. Pt making progress with balance and stepping activities. Pain increased with cybex machine work. Left knee flexion 105 actively and 110 passively today. Pt instructed to not use her straight cane unless she is walking on uneven terrain.  Continue skilled PT to maximize function.    Personal Factors and Comorbidities Comorbidity 3+    Comorbidities COPD, arthritis, anemia, depression, HTN, neuromuscular disorder    Examination-Activity Limitations Lift;Squat;Stairs;Stand;Other;Bend    Examination-Participation Restrictions Driving;Other;Occupation;Community Activity    Stability/Clinical Decision Making Stable/Uncomplicated    Rehab Potential Excellent    PT Frequency 2x / week    PT Duration 8 weeks    PT Treatment/Interventions ADLs/Self Care Home Management;Cryotherapy;Electrical Stimulation;Moist Heat;Ultrasound;Gait training;Stair training;Functional mobility training;Therapeutic activities;Therapeutic exercise;Balance training;Patient/family education;Neuromuscular re-education;Manual techniques;Scar mobilization;Passive range of motion;Taping;Vasopneumatic Device    PT Next Visit Plan Continued balance intervention on compliant and non compliant surfaces, progressive strengthening c machine work.    PT Home Exercise Plan Access Code: [email or web address]  URL: [email or web address]  Date: 09/08/2020  Prepared by: Narda Amber    Exercises  Supine Bridge - 2 x daily - 7 x weekly - 2 sets - 10 reps  Straight Leg Raise - 2  x daily - 7 x weekly - 2 sets - 10 reps  Sit to Stand -  2 x daily - 7 x weekly - 2 sets - 10 reps  Seated Knee Flexion AAROM - 3 x daily - 7 x weekly - 2 sets - 10 reps  Heel Toe Raises with Counter Support - 2 x daily - 7 x weekly - 2 sets - 10 reps  Standing Heel Raise with Support - 2 x daily - 7 x weekly - 2 sets - 10 reps  Standing Hip Abduction with Counter Support - 2 x daily - 7 x weekly - 2 sets - 10 reps  Long Sitting Quad Set with Towel Roll Under Heel - 2 x daily - 7 x weekly - 2 sets - 10 reps - 5 seconds hold  Supine Heel Slide with Strap - 2 x daily - 7 x weekly - 2 sets - 10 reps - 5 seconds hold    Consulted and Agree with Plan of Care Patient           Patient will benefit from skilled therapeutic intervention in order to improve the following deficits and impairments:  Pain,Difficulty walking,Decreased balance,Increased edema,Impaired flexibility,Decreased strength,Decreased range of motion,Decreased activity tolerance,Decreased mobility  Visit Diagnosis: Acute pain of left knee  Stiffness of left knee, not elsewhere classified  Localized edema  Difficulty in walking, not elsewhere classified     Problem List Patient Active Problem List   Diagnosis Date Noted  . Moderate persistent asthma without complication 09/17/2020  . Arthritis of left knee   . S/P total knee arthroplasty, left 08/17/2020  . Inflammatory arthritis 10/10/2019  . GAD (generalized anxiety disorder) 06/28/2018  . Primary osteoarthritis of left knee 04/17/2018  . Prediabetes 12/03/2017  . Irritable bowel 03/19/2017  . GERD without esophagitis 03/19/2017  . Fibromyalgia 11/28/2016  . Migraine without aura and without status migrainosus, not intractable 10/17/2016  . Essential hypertension 10/17/2016    Sharmon Leyden, PT, MPT 09/21/2020, 1:39 PM  [facility] [address] [place], [place], [address] Phone: [phone]   Fax:  [phone]  Name:  Kristi Decker MRN: [id number] Date of Birth: 1975-06-24

## 2020-09-23 ENCOUNTER — Encounter: Payer: Self-pay | Admitting: Rehabilitative and Restorative Service Providers"

## 2020-09-23 ENCOUNTER — Ambulatory Visit: Payer: 59 | Admitting: Rehabilitative and Restorative Service Providers"

## 2020-09-23 ENCOUNTER — Other Ambulatory Visit: Payer: Self-pay

## 2020-09-23 DIAGNOSIS — R600 Localized edema: Secondary | ICD-10-CM

## 2020-09-23 DIAGNOSIS — M25562 Pain in left knee: Secondary | ICD-10-CM | POA: Diagnosis not present

## 2020-09-23 DIAGNOSIS — M25662 Stiffness of left knee, not elsewhere classified: Secondary | ICD-10-CM

## 2020-09-23 DIAGNOSIS — R262 Difficulty in walking, not elsewhere classified: Secondary | ICD-10-CM | POA: Diagnosis not present

## 2020-09-23 NOTE — Therapy (Signed)
[facility] [address] [place], [place], [address] Phone: [phone]   Fax:  [phone]  Physical Therapy Treatment  Patient Details  Name: Kristi Decker MRN: [id number] Date of Birth: 1975-07-17 Referring Provider (PT): August Saucer Corrie Mckusick, MD   Encounter Date: 09/23/2020   PT End of Session - 09/23/20 1007    Visit Number 6    Number of Visits 16    Date for PT Re-Evaluation 11/05/20    Authorization Type [employer]    Progress Note Due on Visit 10    PT Start Time 1008    PT Stop Time 1048    PT Time Calculation (min) 40 min    Equipment Utilized During Treatment --    Activity Tolerance Patient tolerated treatment well    Behavior During Therapy [facility] for tasks assessed/performed           Past Medical History:  Diagnosis Date  . Anemia   . Anxiety   . Arthritis    left knee  . Asthma   . COPD (chronic obstructive pulmonary disease) (HCC)   . Depression   . GERD (gastroesophageal reflux disease)   . Hypertension   . Neuromuscular disorder (HCC)    right arm    Past Surgical History:  Procedure Laterality Date  . DILATION AND CURETTAGE OF UTERUS     2002  . TOTAL KNEE ARTHROPLASTY Left 08/17/2020   Procedure: LEFT TOTAL KNEE ARTHROPLASTY;  Surgeon: Cammy Copa, MD;  Location: [facility] OR;  Service: Orthopedics;  Laterality: Left;  . UPPER GI ENDOSCOPY    . WISDOM TOOTH EXTRACTION      There were no vitals filed for this visit.   Subjective Assessment - 09/23/20 1009    Subjective Pt. indicated pain around a 2 today.  Pt. stated feeling calf pull from last visit timing but getting some better slowly.    Pertinent History COPD, arthritis, anemia, depression, HTN, neuromuscular disorder    Limitations Standing;Walking;House hold activities    Diagnostic tests X-ray    Patient Stated Goals I want to get back to my normal self.    Pain Score 2     Pain Location Knee    Pain Orientation Left    Pain  Descriptors / Indicators Aching    Pain Onset More than a month ago    Pain Frequency Intermittent    Aggravating Factors  nighttime soreness can increase in bed    Pain Relieving Factors icing, resting, movement helps loosen up              [facility] PT Assessment - 09/23/20 0001      Assessment   Medical Diagnosis Z96.652 left TKA    Referring Provider (PT) Cammy Copa, MD    Onset Date/Surgical Date 08/17/20    Hand Dominance Right      AROM   Left Knee Extension 0   in LAQ   Left Knee Flexion 110   in supine heel slide                        [facility] Adult PT Treatment/Exercise - 09/23/20 0001      Neuro Re-ed    Neuro Re-ed Details  tandem stance 1 min x 2 bilateral (on floor due to calf complaint)      Knee/Hip Exercises: Stretches   Gastroc Stretch 30 seconds;5 reps;Both   incine board     Knee/Hip Exercises: Aerobic  Recumbent Bike Seat 7 lvl 3 6 mins      Knee/Hip Exercises: Machines for Strengthening   Cybex Knee Extension Eccentric lowering Lt leg 3 x 10  10 lbs    Cybex Knee Flexion SL left 3 x 10 10 lbs      Knee/Hip Exercises: Standing   Lateral Step Up 2 sets;10 reps;Both;Step Height: 4"   cues for eccentric control to tolerance     Knee/Hip Exercises: Supine   Other Supine Knee/Hip Exercises combination active heel slide, SLR 2 x 10 Lt   Reviewed strap pull for extra at home     Manual Therapy   Manual therapy comments seated Lt knee flexion, distraction, IR mobilizations c movement c contralateral leg movement opposite                    PT Short Term Goals - 09/09/20 1016      PT SHORT TERM GOAL #1   Title Pt will be independent in her initial HEP.    Time 3    Period Weeks    Status On-going    Target Date 10/01/20      PT SHORT TERM GOAL #2   Title Pt will be able to perform 5 time sit to stand in </= 12 seconds with no UE support.    Baseline 14.5 seconds with UE support.    Status On-going    Target Date  10/01/20             PT Long Term Goals - 09/21/20 1335      PT LONG TERM GOAL #1   Title Pt will be independent in her advanced HEP.    Status On-going      PT LONG TERM GOAL #2   Title Pt will be able to perform left knee flexion to >/= 125 degrees to improve functional mobility.    Baseline 105 degrees on 09/21/2020.    Status On-going      PT LONG TERM GOAL #3   Title Pt will be able to walk community surfaces for >/= 30 minutes with no device with no pain reported.    Baseline pt amb household distances up to 10 minutes with straight cane and varying pain    Status On-going      PT LONG TERM GOAL #4   Title Pt will improve her left LE knee flexio and extension to >/= 4+/5 in order to improve gait and functional mobility.    Status On-going      PT LONG TERM GOAL #5   Title Pt will be able to amb up and down 5 steps safely with no hand rail.    Status On-going      PT LONG TERM GOAL #6   Title FOTO outcome > or = 63    Status On-going                 Plan - 09/23/20 1025    Clinical Impression Statement Active mobility improving well at this time compared to previous measurements.  Continued emphasis on quad strenghtening and movement coordination control.    Personal Factors and Comorbidities Comorbidity 3+    Comorbidities COPD, arthritis, anemia, depression, HTN, neuromuscular disorder    Examination-Activity Limitations Lift;Squat;Stairs;Stand;Other;Bend    Examination-Participation Restrictions Driving;Other;Occupation;Community Activity    Stability/Clinical Decision Making Stable/Uncomplicated    Rehab Potential Excellent    PT Frequency 2x / week    PT Duration 8 weeks  PT Treatment/Interventions ADLs/Self Care Home Management;Cryotherapy;Electrical Stimulation;Moist Heat;Ultrasound;Gait training;Stair training;Functional mobility training;Therapeutic activities;Therapeutic exercise;Balance training;Patient/family education;Neuromuscular  re-education;Manual techniques;Scar mobilization;Passive range of motion;Taping;Vasopneumatic Device    PT Next Visit Plan Continued balance intervention on compliant and non compliant surfaces, progressive strengthening c machine work.    PT Home Exercise Plan Access Code: [email or web address]  URL: [email or web address]  Date: 09/08/2020  Prepared by: Narda Amber    Exercises  Supine Bridge - 2 x daily - 7 x weekly - 2 sets - 10 reps  Straight Leg Raise - 2 x daily - 7 x weekly - 2 sets - 10 reps  Sit to Stand - 2 x daily - 7 x weekly - 2 sets - 10 reps  Seated Knee Flexion AAROM - 3 x daily - 7 x weekly - 2 sets - 10 reps  Heel Toe Raises with Counter Support - 2 x daily - 7 x weekly - 2 sets - 10 reps  Standing Heel Raise with Support - 2 x daily - 7 x weekly - 2 sets - 10 reps  Standing Hip Abduction with Counter Support - 2 x daily - 7 x weekly - 2 sets - 10 reps  Long Sitting Quad Set with Towel Roll Under Heel - 2 x daily - 7 x weekly - 2 sets - 10 reps - 5 seconds hold  Supine Heel Slide with Strap - 2 x daily - 7 x weekly - 2 sets - 10 reps - 5 seconds hold    Consulted and Agree with Plan of Care Patient           Patient will benefit from skilled therapeutic intervention in order to improve the following deficits and impairments:  Pain,Difficulty walking,Decreased balance,Increased edema,Impaired flexibility,Decreased strength,Decreased range of motion,Decreased activity tolerance,Decreased mobility  Visit Diagnosis: Acute pain of left knee  Stiffness of left knee, not elsewhere classified  Localized edema  Difficulty in walking, not elsewhere classified     Problem List Patient Active Problem List   Diagnosis Date Noted  . Moderate persistent asthma without complication 09/17/2020  . Arthritis of left knee   . S/P total knee arthroplasty, left 08/17/2020  . Inflammatory arthritis 10/10/2019  . GAD (generalized anxiety disorder) 06/28/2018  . Primary osteoarthritis of  left knee 04/17/2018  . Prediabetes 12/03/2017  . Irritable bowel 03/19/2017  . GERD without esophagitis 03/19/2017  . Fibromyalgia 11/28/2016  . Migraine without aura and without status migrainosus, not intractable 10/17/2016  . Essential hypertension 10/17/2016    Chyrel Masson, PT, DPT, OCS, ATC 09/23/20  10:48 AM    [facility] [address] [place], [place], [address] Phone: [phone]   Fax:  [phone]  Name: Kristi Decker MRN: [id number] Date of Birth: [birth year]

## 2020-09-27 ENCOUNTER — Other Ambulatory Visit: Payer: Self-pay | Admitting: Surgical

## 2020-09-27 ENCOUNTER — Encounter: Payer: Self-pay | Admitting: Family Medicine

## 2020-09-27 ENCOUNTER — Other Ambulatory Visit: Payer: 59

## 2020-09-27 NOTE — Telephone Encounter (Signed)
Pls advise.  

## 2020-09-28 ENCOUNTER — Telehealth: Payer: Self-pay

## 2020-09-28 ENCOUNTER — Ambulatory Visit: Payer: 59 | Admitting: Physical Therapy

## 2020-09-28 ENCOUNTER — Other Ambulatory Visit: Payer: Self-pay

## 2020-09-28 ENCOUNTER — Encounter: Payer: Self-pay | Admitting: Physical Therapy

## 2020-09-28 DIAGNOSIS — M25662 Stiffness of left knee, not elsewhere classified: Secondary | ICD-10-CM

## 2020-09-28 DIAGNOSIS — R600 Localized edema: Secondary | ICD-10-CM | POA: Diagnosis not present

## 2020-09-28 DIAGNOSIS — M25562 Pain in left knee: Secondary | ICD-10-CM | POA: Diagnosis not present

## 2020-09-28 DIAGNOSIS — R262 Difficulty in walking, not elsewhere classified: Secondary | ICD-10-CM

## 2020-09-28 MED ORDER — FENOFIBRATE 145 MG PO TABS: 145.0000 mg | ORAL_TABLET | Freq: Every day | ORAL | 2 refills | Status: DC

## 2020-09-28 MED ORDER — PANTOPRAZOLE SODIUM 40 MG PO TBEC: 40.0000 mg | DELAYED_RELEASE_TABLET | Freq: Every day | ORAL | 2 refills | Status: DC

## 2020-09-28 NOTE — Therapy (Signed)
[facility] Physical Therapy [address] [place], [place], [address] Phone: [phone]   Fax:  [phone]  Physical Therapy Treatment  Patient Details  Name: Kristi Decker MRN: [id number] Date of Birth: Aug 17, 1975 Referring Provider (PT): August Saucer Corrie Mckusick, MD   Encounter Date: 09/28/2020   PT End of Session - 09/28/20 1322    Visit Number 7    Number of Visits 16    Date for PT Re-Evaluation 11/05/20    Authorization Type [employer]    Progress Note Due on Visit 10    PT Start Time 1300    PT Stop Time 1339    PT Time Calculation (min) 39 min    Equipment Utilized During Treatment Gait belt    Activity Tolerance Patient tolerated treatment well    Behavior During Therapy [facility] for tasks assessed/performed           Past Medical History:  Diagnosis Date  . Anemia   . Anxiety   . Arthritis    left knee  . Asthma   . COPD (chronic obstructive pulmonary disease) (HCC)   . Depression   . GERD (gastroesophageal reflux disease)   . Hypertension   . Neuromuscular disorder (HCC)    right arm    Past Surgical History:  Procedure Laterality Date  . DILATION AND CURETTAGE OF UTERUS     2002  . TOTAL KNEE ARTHROPLASTY Left 08/17/2020   Procedure: LEFT TOTAL KNEE ARTHROPLASTY;  Surgeon: Cammy Copa, MD;  Location: [facility] OR;  Service: Orthopedics;  Laterality: Left;  . UPPER GI ENDOSCOPY    . WISDOM TOOTH EXTRACTION      There were no vitals filed for this visit.   Subjective Assessment - 09/28/20 1301    Subjective Pt arriving today reporting 1/10 pain left knee.  Pt stating she is off her prescription pain meds.    Pertinent History COPD, arthritis, anemia, depression, HTN, neuromuscular disorder    Limitations Standing;Walking;House hold activities    How long can you walk comfortably? 10 minutes with straight cane    Diagnostic tests X-ray    Patient Stated Goals I want to get back to my normal self.    Currently in Pain? Yes     Pain Score 1     Pain Location Knee    Pain Orientation Left    Pain Descriptors / Indicators Sore    Pain Type Surgical pain    Pain Onset More than a month ago                             [facility] Adult PT Treatment/Exercise - 09/28/20 0001      Neuro Re-ed    Neuro Re-ed Details  balane beam walking on airex x 6 reps, SLS x 20 seconds with intermittent UE support.      Knee/Hip Exercises: Doctor, hospital 30 seconds;5 reps;Both   incine board     Knee/Hip Exercises: Aerobic   Recumbent Bike seat 6, L4 x 8 minutes      Knee/Hip Exercises: Machines for Strengthening   Cybex Knee Extension Eccentric lowering Lt leg 3 x 10  10 lbs    Cybex Knee Flexion SL left 3 x 10 10 lbs      Knee/Hip Exercises: Standing   Lateral Step Up 2 sets;10 reps;Both;Step Height: 4"   cues for eccentric control to tolerance     Knee/Hip Exercises: Supine  Straight Leg Raises Strengthening;15 reps    Other Supine Knee/Hip Exercises heel slides x 10      Knee/Hip Exercises: Prone   Hamstring Curl 10 reps;3 seconds    Hamstring Curl Limitations L2 theraband      Manual Therapy   Manual therapy comments seated Lt knee flexion, distraction, IR mobilizations c movement c contralateral leg movement opposite                    PT Short Term Goals - 09/28/20 1319      PT SHORT TERM GOAL #1   Title Pt will be independent in her initial HEP.    Status On-going      PT SHORT TERM GOAL #2   Title Pt will be able to perform 5 time sit to stand in </= 12 seconds with no UE support.    Baseline 12 seconds without UE support.    Status Achieved             PT Long Term Goals - 09/28/20 1321      PT LONG TERM GOAL #1   Title Pt will be independent in her advanced HEP.    Status On-going      PT LONG TERM GOAL #2   Title Pt will be able to perform left knee flexion to >/= 125 degrees to improve functional mobility.    Baseline 105 degrees on 09/21/2020.     Status On-going      PT LONG TERM GOAL #3   Title Pt will be able to walk community surfaces for >/= 30 minutes with no device with no pain reported.    Status On-going      PT LONG TERM GOAL #4   Title Pt will improve her left LE knee flexio and extension to >/= 4+/5 in order to improve gait and functional mobility.    Status On-going      PT LONG TERM GOAL #5   Title Pt will be able to amb up and down 5 steps safely with no hand rail.    Status Achieved      PT LONG TERM GOAL #6   Title FOTO outcome > or = 63    Status On-going                 Plan - 09/28/20 1312    Clinical Impression Statement Pt progressing with left LE strength and AROM. Focusing on quad strengthening and dynamic balance. Progresing with overall stability.  Continue skilled PT fo rmaximize function.    Comorbidities COPD, arthritis, anemia, depression, HTN, neuromuscular disorder    Examination-Activity Limitations Lift;Squat;Stairs;Stand;Other;Bend    Examination-Participation Restrictions Driving;Other;Occupation;Community Activity    Stability/Clinical Decision Making Stable/Uncomplicated    Rehab Potential Excellent    PT Frequency 2x / week    PT Duration 8 weeks    PT Treatment/Interventions ADLs/Self Care Home Management;Cryotherapy;Electrical Stimulation;Moist Heat;Ultrasound;Gait training;Stair training;Functional mobility training;Therapeutic activities;Therapeutic exercise;Balance training;Patient/family education;Neuromuscular re-education;Manual techniques;Scar mobilization;Passive range of motion;Taping;Vasopneumatic Device    PT Next Visit Plan Continued balance intervention on compliant and non compliant surfaces, progressive strengthening c machine work.    PT Home Exercise Plan Access Code: [email or web address]  URL: [email or web address]  Date: 09/08/2020  Prepared by: Narda Amber    Exercises  Supine Bridge - 2 x daily - 7 x weekly - 2 sets - 10 reps  Straight Leg Raise - 2 x  daily - 7 x weekly - 2 sets - 10 reps  Sit  to Stand - 2 x daily - 7 x weekly - 2 sets - 10 reps  Seated Knee Flexion AAROM - 3 x daily - 7 x weekly - 2 sets - 10 reps  Heel Toe Raises with Counter Support - 2 x daily - 7 x weekly - 2 sets - 10 reps  Standing Heel Raise with Support - 2 x daily - 7 x weekly - 2 sets - 10 reps  Standing Hip Abduction with Counter Support - 2 x daily - 7 x weekly - 2 sets - 10 reps  Long Sitting Quad Set with Towel Roll Under Heel - 2 x daily - 7 x weekly - 2 sets - 10 reps - 5 seconds hold  Supine Heel Slide with Strap - 2 x daily - 7 x weekly - 2 sets - 10 reps - 5 seconds hold    Consulted and Agree with Plan of Care Patient           Patient will benefit from skilled therapeutic intervention in order to improve the following deficits and impairments:  Pain,Difficulty walking,Decreased balance,Increased edema,Impaired flexibility,Decreased strength,Decreased range of motion,Decreased activity tolerance,Decreased mobility  Visit Diagnosis: Acute pain of left knee  Stiffness of left knee, not elsewhere classified  Localized edema  Difficulty in walking, not elsewhere classified     Problem List Patient Active Problem List   Diagnosis Date Noted  . Moderate persistent asthma without complication 09/17/2020  . Arthritis of left knee   . S/P total knee arthroplasty, left 08/17/2020  . Inflammatory arthritis 10/10/2019  . GAD (generalized anxiety disorder) 06/28/2018  . Primary osteoarthritis of left knee 04/17/2018  . Prediabetes 12/03/2017  . Irritable bowel 03/19/2017  . GERD without esophagitis 03/19/2017  . Fibromyalgia 11/28/2016  . Migraine without aura and without status migrainosus, not intractable 10/17/2016  . Essential hypertension 10/17/2016    Sharmon Leyden, PT, MPT 09/28/2020, 1:39 PM  [facility] Physical Therapy [address] [place], [place], [address] Phone: [phone]   Fax:  [phone]  Name:  Kristi Decker MRN: [id number] Date of Birth: 07/13/1975

## 2020-09-28 NOTE — Telephone Encounter (Signed)
Patient called regarding a referral for her mri patient stated she is unable to go to GI due to her insurance being out of network she is requesting a referral to be sent to a facility that is in network with her insurance, I advised patient that she will more than likely be referred to either [place] or [place] hospital to complete her mri and that she should be receiving a call back to schedule call back:[phone]

## 2020-09-28 NOTE — Telephone Encounter (Signed)
Message was sent to [place] central scheduling to see if can schedule pt

## 2020-09-29 ENCOUNTER — Encounter: Payer: Self-pay | Admitting: Orthopedic Surgery

## 2020-09-29 ENCOUNTER — Ambulatory Visit (INDEPENDENT_AMBULATORY_CARE_PROVIDER_SITE_OTHER): Payer: 59 | Admitting: Orthopedic Surgery

## 2020-09-29 DIAGNOSIS — Z96652 Presence of left artificial knee joint: Secondary | ICD-10-CM

## 2020-09-29 DIAGNOSIS — Z8739 Personal history of other diseases of the musculoskeletal system and connective tissue: Secondary | ICD-10-CM

## 2020-09-29 NOTE — Telephone Encounter (Signed)
This has been resolved

## 2020-09-29 NOTE — Progress Notes (Signed)
Post-Op Visit Note   Patient: Kristi Decker           Date of Birth: 1975-11-18           MRN: [id number] Visit Date: 09/29/2020 PCP: Avanell Shackleton, NP-C   Assessment & Plan:  Chief Complaint:  Chief Complaint  Patient presents with  . Left Knee - Routine Post Op   Visit Diagnoses:  1. History of rheumatoid arthritis   2. S/P total knee arthroplasty, left     Plan: Patient is a 45 year old female who presents s/p left total knee arthroplasty on 08/17/2020.  She is doing very well overall in regards to her left knee.  She is not using CPM machine but she is going to physical therapy 2 times per week.  She is ambulating well without a cane or walker.  Only taking over-the-counter medications for pain and no longer taking any opioid medications.  She has reached 115 degrees in physical therapy.  She has resumed taking her sulfasalazine.  She denies any difficulty with navigating stairs or standing up from sitting on the toilet.  On examination today she has 0 degrees extension and 105 degrees of knee flexion.  Incision is well-healed with no evidence of infection or dehiscence.  No calf tenderness.  Negative Homans' sign.  No effusion present.  She has good side-to-side patellar mobility so anticipate further progress in regards to her knee flexion.  Excellent quadricep strength and able to perform straight leg raise.  Patient is having continued symptoms through her right arm and her neck.  She has cervical spine MRI scheduled for next Thursday.  Plan to call her with these MRI results.  Additionally, she reports she has been diagnosed with rheumatoid arthritis in the past and is currently taking sulfasalazine which is prescribed by her former rheumatologist in [place].  She would like to establish with a more local rheumatologist so plan to refer her to rheumatology for further management of her rheumatoid arthritis.  She is having increased pain through multiple joints in her bilateral  hands including her multiple DIP and PIP joint.  Follow-up in 6 weeks for clinical recheck regarding her left knee.  Follow-Up Instructions: No follow-ups on file.   Orders:  Orders Placed This Encounter  Procedures  . Ambulatory referral to Rheumatology   No orders of the defined types were placed in this encounter.   Imaging: No results found.  PMFS History: Patient Active Problem List   Diagnosis Date Noted  . Moderate persistent asthma without complication 09/17/2020  . Arthritis of left knee   . S/P total knee arthroplasty, left 08/17/2020  . Inflammatory arthritis 10/10/2019  . GAD (generalized anxiety disorder) 06/28/2018  . Primary osteoarthritis of left knee 04/17/2018  . Prediabetes 12/03/2017  . Irritable bowel 03/19/2017  . GERD without esophagitis 03/19/2017  . Fibromyalgia 11/28/2016  . Migraine without aura and without status migrainosus, not intractable 10/17/2016  . Essential hypertension 10/17/2016   Past Medical History:  Diagnosis Date  . Anemia   . Anxiety   . Arthritis    left knee  . Asthma   . COPD (chronic obstructive pulmonary disease) (HCC)   . Depression   . GERD (gastroesophageal reflux disease)   . Hypertension   . Neuromuscular disorder (HCC)    right arm    No family history on file.  Past Surgical History:  Procedure Laterality Date  . DILATION AND CURETTAGE OF UTERUS     2002  . TOTAL  KNEE ARTHROPLASTY Left 08/17/2020   Procedure: LEFT TOTAL KNEE ARTHROPLASTY;  Surgeon: Cammy Copa, MD;  Location: [facility] OR;  Service: Orthopedics;  Laterality: Left;  . UPPER GI ENDOSCOPY    . WISDOM TOOTH EXTRACTION     Social History   Occupational History  . Not on file  Tobacco Use  . Smoking status: Never Smoker  . Smokeless tobacco: Never Used  Vaping Use  . Vaping Use: Never used  Substance and Sexual Activity  . Alcohol use: Never  . Drug use: Never  . Sexual activity: Not on file

## 2020-09-30 ENCOUNTER — Other Ambulatory Visit: Payer: Self-pay

## 2020-09-30 ENCOUNTER — Ambulatory Visit: Payer: 59 | Admitting: Rehabilitative and Restorative Service Providers"

## 2020-09-30 ENCOUNTER — Encounter: Payer: Self-pay | Admitting: Rehabilitative and Restorative Service Providers"

## 2020-09-30 DIAGNOSIS — M25562 Pain in left knee: Secondary | ICD-10-CM | POA: Diagnosis not present

## 2020-09-30 DIAGNOSIS — R262 Difficulty in walking, not elsewhere classified: Secondary | ICD-10-CM | POA: Diagnosis not present

## 2020-09-30 DIAGNOSIS — R600 Localized edema: Secondary | ICD-10-CM

## 2020-09-30 DIAGNOSIS — M25662 Stiffness of left knee, not elsewhere classified: Secondary | ICD-10-CM

## 2020-09-30 NOTE — Therapy (Signed)
[facility] Physical Therapy [address] [place], [place], [address] Phone: [phone]   Fax:  [phone]  Physical Therapy Treatment  Patient Details  Name: Kristi Decker MRN: [id number] Date of Birth: 1975-08-05 Referring Provider (PT): August Saucer Corrie Mckusick, MD   Encounter Date: 09/30/2020   PT End of Session - 09/30/20 1013    Visit Number 8    Number of Visits 16    Date for PT Re-Evaluation 11/05/20    Authorization Type [employer]    Progress Note Due on Visit 10    PT Start Time 1013    PT Stop Time 1110    PT Time Calculation (min) 57 min    Equipment Utilized During Treatment Gait belt    Activity Tolerance Patient limited by fatigue;Patient limited by pain    Behavior During Therapy [facility] for tasks assessed/performed           Past Medical History:  Diagnosis Date  . Anemia   . Anxiety   . Arthritis    left knee  . Asthma   . COPD (chronic obstructive pulmonary disease) (HCC)   . Depression   . GERD (gastroesophageal reflux disease)   . Hypertension   . Neuromuscular disorder (HCC)    right arm    Past Surgical History:  Procedure Laterality Date  . DILATION AND CURETTAGE OF UTERUS     2002  . TOTAL KNEE ARTHROPLASTY Left 08/17/2020   Procedure: LEFT TOTAL KNEE ARTHROPLASTY;  Surgeon: Cammy Copa, MD;  Location: [facility] OR;  Service: Orthopedics;  Laterality: Left;  . UPPER GI ENDOSCOPY    . WISDOM TOOTH EXTRACTION      There were no vitals filed for this visit.   Subjective Assessment - 09/30/20 1020    Subjective Pt. indicated no pain upon arrival today in knee.  Pt. stated she is able to don and doff shoes now.    Pertinent History COPD, arthritis, anemia, depression, HTN, neuromuscular disorder    Limitations Standing;Walking;House hold activities    How long can you walk comfortably? 10 minutes with straight cane    Diagnostic tests X-ray    Patient Stated Goals I want to get back to my normal self.     Currently in Pain? No/denies    Pain Score 0-No pain    Pain Onset More than a month ago              [facility] PT Assessment - 09/30/20 0001      Transfers   Five time sit to stand comments  11.3 with no UE assist                         [facility] Adult PT Treatment/Exercise - 09/30/20 0001      Neuro Re-ed    Neuro Re-ed Details  fwd/lateral/backward slider x 10 Lt leg   on last rep, Pt. indicated sharp distal lateral quad, lateral knee pain that was reduced but present in standing for next 5 mins or so.     Knee/Hip Exercises: Doctor, hospital 30 seconds;5 reps;Both      Knee/Hip Exercises: Aerobic   Recumbent Bike Lvl 2 10 mins      Knee/Hip Exercises: Machines for Strengthening   Cybex Leg Press bilateral 2 x 10 75 lbs, single leg 2 x 10 31 lbs    Other Machine held knee extension/flexion due to pain complaints as documented, resume as able  Knee/Hip Exercises: Standing   Lateral Step Up 2 sets;Both;10 reps;Step Height: 6";Hand Hold: 0      Knee/Hip Exercises: Seated   Sit to Sand without UE support   18 inch chair x 5 fast, x 10 slow eccentric lowering     Vasopneumatic   Number Minutes Vasopneumatic  10 minutes    Vasopnuematic Location  Knee    Vasopneumatic Pressure Medium    Vasopneumatic Temperature  34      Manual Therapy   Manual therapy comments percussive device light pressure middle and distal 1/3rd of anterior/lateral quad Lt, STM by hand to same                    PT Short Term Goals - 09/30/20 1022      PT SHORT TERM GOAL #1   Title Pt will be independent in her initial HEP.    Status Achieved      PT SHORT TERM GOAL #2   Title Pt will be able to perform 5 time sit to stand in </= 12 seconds with no UE support.    Baseline 12 seconds without UE support.    Status Achieved             PT Long Term Goals - 09/28/20 1321      PT LONG TERM GOAL #1   Title Pt will be independent in her advanced HEP.     Status On-going      PT LONG TERM GOAL #2   Title Pt will be able to perform left knee flexion to >/= 125 degrees to improve functional mobility.    Baseline 105 degrees on 09/21/2020.    Status On-going      PT LONG TERM GOAL #3   Title Pt will be able to walk community surfaces for >/= 30 minutes with no device with no pain reported.    Status On-going      PT LONG TERM GOAL #4   Title Pt will improve her left LE knee flexio and extension to >/= 4+/5 in order to improve gait and functional mobility.    Status On-going      PT LONG TERM GOAL #5   Title Pt will be able to amb up and down 5 steps safely with no hand rail.    Status Achieved      PT LONG TERM GOAL #6   Title FOTO outcome > or = 63    Status On-going                 Plan - 09/30/20 1028    Clinical Impression Statement Pt. achieved sit to stand goal as documented.  Due to improvement in mobility mesaurements overall, continued focus on strengthening and movement coordination control indicated at this time.  Of note today, Pt. experienced quick complaint of lateral distal quad/knee pain and some Lt lateral hip pain after completion of standing balance slider activity.  Pt. reported reduced but similar area complaints c standing/walking for period of time after while in clinic.  Quick assessment revealed pain in Lt lateral knee c active extension movement around 20-30 degrees.  No complaints c passive movement in area.  Tenderness to touch in lateral hip glute med c trigger points noted, distal lateral quad tenderness noted.    Comorbidities COPD, arthritis, anemia, depression, HTN, neuromuscular disorder    Examination-Activity Limitations Lift;Squat;Stairs;Stand;Other;Bend    Examination-Participation Restrictions Driving;Other;Occupation;Community Activity    Stability/Clinical Decision Making Stable/Uncomplicated  Rehab Potential Excellent    PT Frequency 2x / week    PT Duration 8 weeks    PT  Treatment/Interventions ADLs/Self Care Home Management;Cryotherapy;Electrical Stimulation;Moist Heat;Ultrasound;Gait training;Stair training;Functional mobility training;Therapeutic activities;Therapeutic exercise;Balance training;Patient/family education;Neuromuscular re-education;Manual techniques;Scar mobilization;Passive range of motion;Taping;Vasopneumatic Device    PT Next Visit Plan Reassess quad and lateral thigh tenderness, trigger points and address accordingly.  Progress quad strength to improve as tolerated.    PT Home Exercise Plan Access Code: [email or web address]  URL: [email or web address]  Date: 09/08/2020  Prepared by: Narda Amber    Exercises  Supine Bridge - 2 x daily - 7 x weekly - 2 sets - 10 reps  Straight Leg Raise - 2 x daily - 7 x weekly - 2 sets - 10 reps  Sit to Stand - 2 x daily - 7 x weekly - 2 sets - 10 reps  Seated Knee Flexion AAROM - 3 x daily - 7 x weekly - 2 sets - 10 reps  Heel Toe Raises with Counter Support - 2 x daily - 7 x weekly - 2 sets - 10 reps  Standing Heel Raise with Support - 2 x daily - 7 x weekly - 2 sets - 10 reps  Standing Hip Abduction with Counter Support - 2 x daily - 7 x weekly - 2 sets - 10 reps  Long Sitting Quad Set with Towel Roll Under Heel - 2 x daily - 7 x weekly - 2 sets - 10 reps - 5 seconds hold  Supine Heel Slide with Strap - 2 x daily - 7 x weekly - 2 sets - 10 reps - 5 seconds hold    Consulted and Agree with Plan of Care Patient           Patient will benefit from skilled therapeutic intervention in order to improve the following deficits and impairments:  Pain,Difficulty walking,Decreased balance,Increased edema,Impaired flexibility,Decreased strength,Decreased range of motion,Decreased activity tolerance,Decreased mobility  Visit Diagnosis: Acute pain of left knee  Stiffness of left knee, not elsewhere classified  Localized edema  Difficulty in walking, not elsewhere classified     Problem List Patient Active  Problem List   Diagnosis Date Noted  . Moderate persistent asthma without complication 09/17/2020  . Arthritis of left knee   . S/P total knee arthroplasty, left 08/17/2020  . Inflammatory arthritis 10/10/2019  . GAD (generalized anxiety disorder) 06/28/2018  . Primary osteoarthritis of left knee 04/17/2018  . Prediabetes 12/03/2017  . Irritable bowel 03/19/2017  . GERD without esophagitis 03/19/2017  . Fibromyalgia 11/28/2016  . Migraine without aura and without status migrainosus, not intractable 10/17/2016  . Essential hypertension 10/17/2016    Chyrel Masson, PT, DPT, OCS, ATC 09/30/20  11:01 AM    [facility] Physical Therapy [address] [place], [place], [address] Phone: [phone]   Fax:  [phone]  Name: Kristi Decker MRN: [id number] Date of Birth: 27-Jan-1976

## 2020-10-05 ENCOUNTER — Telehealth: Payer: Self-pay

## 2020-10-05 ENCOUNTER — Other Ambulatory Visit: Payer: Self-pay | Admitting: Surgical

## 2020-10-05 ENCOUNTER — Telehealth: Payer: Self-pay | Admitting: Orthopedic Surgery

## 2020-10-05 ENCOUNTER — Ambulatory Visit: Payer: 59 | Admitting: Physical Therapy

## 2020-10-05 ENCOUNTER — Encounter: Payer: Self-pay | Admitting: Orthopedic Surgery

## 2020-10-05 ENCOUNTER — Encounter: Payer: Self-pay | Admitting: Physical Therapy

## 2020-10-05 ENCOUNTER — Other Ambulatory Visit: Payer: Self-pay

## 2020-10-05 DIAGNOSIS — R262 Difficulty in walking, not elsewhere classified: Secondary | ICD-10-CM | POA: Diagnosis not present

## 2020-10-05 DIAGNOSIS — M25562 Pain in left knee: Secondary | ICD-10-CM

## 2020-10-05 DIAGNOSIS — R600 Localized edema: Secondary | ICD-10-CM | POA: Diagnosis not present

## 2020-10-05 DIAGNOSIS — M25662 Stiffness of left knee, not elsewhere classified: Secondary | ICD-10-CM

## 2020-10-05 NOTE — Therapy (Addendum)
[facility] Physical Therapy [address] [place], [place], [address] Phone: [phone]   Fax:  [phone]  Physical Therapy Treatment  Patient Details  Name: Kristi Decker MRN: [id number] Date of Birth: 02/22/1976 Referring Provider (PT): August Saucer Corrie Mckusick, MD   Encounter Date: 10/05/2020   PT End of Session - 10/05/20 1036    Visit Number 9    Number of Visits 16    Date for PT Re-Evaluation 11/05/20    Authorization Type [employer]    Progress Note Due on Visit 10    PT Start Time 1018    PT Stop Time 1058    PT Time Calculation (min) 40 min    Equipment Utilized During Treatment Gait belt    Activity Tolerance Patient limited by fatigue;Patient limited by pain    Behavior During Therapy [facility] for tasks assessed/performed           Past Medical History:  Diagnosis Date  . Anemia   . Anxiety   . Arthritis    left knee  . Asthma   . COPD (chronic obstructive pulmonary disease) (HCC)   . Depression   . GERD (gastroesophageal reflux disease)   . Hypertension   . Neuromuscular disorder (HCC)    right arm    Past Surgical History:  Procedure Laterality Date  . DILATION AND CURETTAGE OF UTERUS     2002  . TOTAL KNEE ARTHROPLASTY Left 08/17/2020   Procedure: LEFT TOTAL KNEE ARTHROPLASTY;  Surgeon: Cammy Copa, MD;  Location: [facility] OR;  Service: Orthopedics;  Laterality: Left;  . UPPER GI ENDOSCOPY    . WISDOM TOOTH EXTRACTION      There were no vitals filed for this visit.   Subjective Assessment - 10/05/20 1035    Subjective Pt stating she is having more pain on lateral hip today 2/10.    Pertinent History COPD, arthritis, anemia, depression, HTN, neuromuscular disorder    Limitations Standing;Walking;House hold activities    How long can you walk comfortably? 10 minutes with straight cane    Diagnostic tests X-ray    Patient Stated Goals I want to get back to my normal self.    Currently in Pain? Yes    Pain Score 2      Pain Location Hip    Pain Orientation Left    Pain Descriptors / Indicators Sore;Aching    Pain Type Acute pain    Pain Onset More than a month ago              [facility] PT Assessment - 10/05/20 0001      Assessment   Medical Diagnosis left TKA    Referring Provider (PT) Cammy Copa, MD    Onset Date/Surgical Date 08/17/20      AROM   Overall AROM Comments supine    Left Knee Extension 0    Left Knee Flexion 110                         [facility] Adult PT Treatment/Exercise - 10/05/20 0001      Neuro Re-ed    Neuro Re-ed Details  SLS: L LE 25 seconds      Knee/Hip Exercises: Stretches   Gastroc Stretch 30 seconds;Both;4 reps      Knee/Hip Exercises: Aerobic   Recumbent Bike Lvl 2 10 mins      Knee/Hip Exercises: Machines for Strengthening   Cybex Leg Press bilateral 2 x 10 75 lbs,  single leg 2 x 10 31 lbs      Knee/Hip Exercises: Standing   Lateral Step Up 2 sets;Both;10 reps;Step Height: 6";Hand Hold: 0      Knee/Hip Exercises: Seated   Sit to Sand 15 reps      Knee/Hip Exercises: Sidelying   Other Sidelying Knee/Hip Exercises left hip abd 2x10      Knee/Hip Exercises: Prone   Hip Extension Strengthening;Left;2 sets;10 reps      Manual Therapy   Manual therapy comments attempted percussion device to lateral hip/glutes and IT band, pt unable to tolerate due to sensitivity, STM performed with limited tolerance x 5 minutes.                  PT Education - 10/05/20 1036    Education Details discussed muscle fatigue    Person(s) Educated Patient    Methods Explanation    Comprehension Verbalized understanding            PT Short Term Goals - 09/30/20 1022      PT SHORT TERM GOAL #1   Title Pt will be independent in her initial HEP.    Status Achieved      PT SHORT TERM GOAL #2   Title Pt will be able to perform 5 time sit to stand in </= 12 seconds with no UE support.    Baseline 12 seconds without UE support.    Status  Achieved             PT Long Term Goals - 10/05/20 1048      PT LONG TERM GOAL #1   Title Pt will be independent in her advanced HEP.    Status On-going      PT LONG TERM GOAL #2   Title Pt will be able to perform left knee flexion to >/= 125 degrees to improve functional mobility.    Status On-going      PT LONG TERM GOAL #3   Title Pt will be able to walk community surfaces for >/= 30 minutes with no device with no pain reported.    Status On-going      PT LONG TERM GOAL #4   Title Pt will improve her left LE knee flexio and extension to >/= 4+/5 in order to improve gait and functional mobility.    Status On-going      PT LONG TERM GOAL #5   Title Pt will be able to amb up and down 5 steps safely with no hand rail.    Status Achieved      PT LONG TERM GOAL #6   Title FOTO outcome > or = 63    Status On-going                 Plan - 10/05/20 1038    Clinical Impression Statement Pt apprehensive today about flaring up her left hip again with muscle fatigue. Pt reporting following her last visit her pain flared during session but was better over the weekend. Pt was edu in muscle fatigue. Pt still with tenderness to left glutes and IT band, STM performed with limited tolerance. Pt still too sensitive for percussion. Continue skilled PT to progress pt toward LTG's. Progress Note due next visit.    Personal Factors and Comorbidities Comorbidity 3+    Comorbidities COPD, arthritis, anemia, depression, HTN, neuromuscular disorder    Examination-Activity Limitations Lift;Squat;Stairs;Stand;Other;Bend    Examination-Participation Restrictions Driving;Other;Occupation;Community Activity    Stability/Clinical Decision Making Stable/Uncomplicated  Rehab Potential Excellent    PT Frequency 2x / week    PT Duration 8 weeks    PT Treatment/Interventions ADLs/Self Care Home Management;Cryotherapy;Electrical Stimulation;Moist Heat;Ultrasound;Gait training;Stair  training;Functional mobility training;Therapeutic activities;Therapeutic exercise;Balance training;Patient/family education;Neuromuscular re-education;Manual techniques;Scar mobilization;Passive range of motion;Taping;Vasopneumatic Device    PT Next Visit Plan Progress Note for 10th visit. Continue to reassess quad and lateral thigh tenderness, trigger points and address accordingly.  Progress quad strength to improve as tolerated.    PT Home Exercise Plan Access Code: [email or web address]  URL: [email or web address]  Date: 09/08/2020  Prepared by: Narda Amber    Exercises  Supine Bridge - 2 x daily - 7 x weekly - 2 sets - 10 reps  Straight Leg Raise - 2 x daily - 7 x weekly - 2 sets - 10 reps  Sit to Stand - 2 x daily - 7 x weekly - 2 sets - 10 reps  Seated Knee Flexion AAROM - 3 x daily - 7 x weekly - 2 sets - 10 reps  Heel Toe Raises with Counter Support - 2 x daily - 7 x weekly - 2 sets - 10 reps  Standing Heel Raise with Support - 2 x daily - 7 x weekly - 2 sets - 10 reps  Standing Hip Abduction with Counter Support - 2 x daily - 7 x weekly - 2 sets - 10 reps  Long Sitting Quad Set with Towel Roll Under Heel - 2 x daily - 7 x weekly - 2 sets - 10 reps - 5 seconds hold  Supine Heel Slide with Strap - 2 x daily - 7 x weekly - 2 sets - 10 reps - 5 seconds hold    Consulted and Agree with Plan of Care Patient           Patient will benefit from skilled therapeutic intervention in order to improve the following deficits and impairments:  Pain,Difficulty walking,Decreased balance,Increased edema,Impaired flexibility,Decreased strength,Decreased range of motion,Decreased activity tolerance,Decreased mobility  Visit Diagnosis: Acute pain of left knee  Stiffness of left knee, not elsewhere classified  Localized edema  Difficulty in walking, not elsewhere classified     Problem List Patient Active Problem List   Diagnosis Date Noted  . Moderate persistent asthma without complication  09/17/2020  . Arthritis of left knee   . S/P total knee arthroplasty, left 08/17/2020  . Inflammatory arthritis 10/10/2019  . GAD (generalized anxiety disorder) 06/28/2018  . Primary osteoarthritis of left knee 04/17/2018  . Prediabetes 12/03/2017  . Irritable bowel 03/19/2017  . GERD without esophagitis 03/19/2017  . Fibromyalgia 11/28/2016  . Migraine without aura and without status migrainosus, not intractable 10/17/2016  . Essential hypertension 10/17/2016    Sharmon Leyden, PT, MPT 10/05/2020, 10:59 AM  [facility] Physical Therapy [address] [place], [place], [address] Phone: [phone]   Fax:  [phone]  Name: Kristi Decker MRN: [id number] Date of Birth: 14-Apr-1976

## 2020-10-05 NOTE — Telephone Encounter (Signed)
delivered

## 2020-10-05 NOTE — Telephone Encounter (Signed)
Addy from [employer] called she stated that you have to re submit the authorization form with correct facility due to the authorization being denied call back:[phone] Fax:[phone]

## 2020-10-05 NOTE — Telephone Encounter (Signed)
I have a blank [employer] form. Would that be it?

## 2020-10-07 ENCOUNTER — Ambulatory Visit (HOSPITAL_COMMUNITY): Payer: 59

## 2020-10-07 ENCOUNTER — Encounter: Payer: Self-pay | Admitting: Rehabilitative and Restorative Service Providers"

## 2020-10-07 ENCOUNTER — Encounter (HOSPITAL_COMMUNITY): Payer: Self-pay

## 2020-10-07 ENCOUNTER — Ambulatory Visit: Payer: 59 | Admitting: Rehabilitative and Restorative Service Providers"

## 2020-10-07 DIAGNOSIS — R600 Localized edema: Secondary | ICD-10-CM

## 2020-10-07 DIAGNOSIS — M25662 Stiffness of left knee, not elsewhere classified: Secondary | ICD-10-CM | POA: Diagnosis not present

## 2020-10-07 DIAGNOSIS — M25562 Pain in left knee: Secondary | ICD-10-CM

## 2020-10-07 DIAGNOSIS — R262 Difficulty in walking, not elsewhere classified: Secondary | ICD-10-CM

## 2020-10-07 NOTE — Therapy (Signed)
[facility] Physical Therapy [address] [place], [place], [address] Phone: [phone]   Fax:  [phone]  Physical Therapy Treatment/Progress Note  Patient Details  Name: Kristi Decker MRN: [id number] Date of Birth: [birth year] Referring Provider (PT): Kristi Decker Kristi Mckusick, MD  Progress Note Reporting Period 09/08/2020 to 10/07/2020  See note below for Objective Data and Assessment of Progress/Goals.      Encounter Date: 10/07/2020   PT End of Session - 10/07/20 1019    Visit Number 10    Number of Visits 16    Date for PT Re-Evaluation 11/05/20    Authorization Type [employer]    Progress Note Due on Visit 16    PT Start Time 1015    PT Stop Time 1100    PT Time Calculation (min) 45 min    Equipment Utilized During Treatment --    Activity Tolerance Patient tolerated treatment well    Behavior During Therapy WFL for tasks assessed/performed           Past Medical History:  Diagnosis Date  . Anemia   . Anxiety   . Arthritis    left knee  . Asthma   . COPD (chronic obstructive pulmonary disease) (HCC)   . Depression   . GERD (gastroesophageal reflux disease)   . Hypertension   . Neuromuscular disorder (HCC)    right arm    Past Surgical History:  Procedure Laterality Date  . DILATION AND CURETTAGE OF UTERUS     2002  . TOTAL KNEE ARTHROPLASTY Left 08/17/2020   Procedure: LEFT TOTAL KNEE ARTHROPLASTY;  Surgeon: Cammy Copa, MD;  Location: [facility] OR;  Service: Orthopedics;  Laterality: Left;  . UPPER GI ENDOSCOPY    . WISDOM TOOTH EXTRACTION      There were no vitals filed for this visit.   Subjective Assessment - 10/07/20 1019    Subjective Pt. indicated no pain upon arrival today.  Pt. stated she didn't feel increase in sorness or pain after last visit.  Still having some nervousness about that lateral hip/thigh pain.  Overall a great deal better +6 on global rating of change compared to evaluation.    Pertinent History  COPD, arthritis, anemia, depression, HTN, neuromuscular disorder    Limitations Standing;Walking;House hold activities    How long can you walk comfortably? 10 minutes with straight cane    Diagnostic tests X-ray    Patient Stated Goals I want to get back to my normal self.    Currently in Pain? No/denies    Pain Score 0-No pain    Pain Onset More than a month ago              [facility] PT Assessment - 10/07/20 0001      Assessment   Medical Diagnosis left TKA    Referring Provider (PT) Cammy Copa, MD    Onset Date/Surgical Date 08/17/20    Hand Dominance Right      Observation/Other Assessments   Focus on Therapeutic Outcomes (FOTO)  update 77 %      Functional Tests   Functional tests Single leg stance      Single Leg Stance   Comments Lt SLS: 21seconds, Rt SLS: 9 seconds      AROM   Left Knee Extension 0    Left Knee Flexion 118   in supine heel slide     Strength   Strength Assessment Site Hip    Right/Left Hip Left    Left  Hip ABduction 4/5   19.6, 19.9 lbs   Right Knee Extension 5/5   61.2, 63.4 lbs   Left Knee Extension 4/5   38.7, 38.8 lbs     Transfers   Five time sit to stand comments  9.54 with no UE assist                         [facility] Adult PT Treatment/Exercise - 10/07/20 0001      Knee/Hip Exercises: Stretches   Gastroc Stretch 30 seconds;5 reps;Both   incline board     Knee/Hip Exercises: Aerobic   Other Aerobic UBE legs only lvl 4.5 10 mins      Knee/Hip Exercises: Machines for Strengthening   Cybex Knee Extension Eccentric lowering Lt leg 3 x 10  10 lbs    Cybex Knee Flexion SL left 3 x 10 15 lbs      Knee/Hip Exercises: Standing   Lateral Step Up 2 sets;Both;10 reps;Step Height: 6";Hand Hold: 0      Knee/Hip Exercises: Supine   Bridges Both;2 sets;10 reps    Straight Leg Raises Left;2 sets;10 reps   eccentric lowering focus     Knee/Hip Exercises: Sidelying   Hip ABduction Left;2 sets;10 reps                     PT Short Term Goals - 09/30/20 1022      PT SHORT TERM GOAL #1   Title Pt will be independent in her initial HEP.    Status Achieved      PT SHORT TERM GOAL #2   Title Pt will be able to perform 5 time sit to stand in </= 12 seconds with no UE support.    Baseline 12 seconds without UE support.    Status Achieved             PT Long Term Goals - 10/07/20 1048      PT LONG TERM GOAL #1   Title Pt will be independent in her advanced HEP.    Status On-going    Target Date 11/05/20      PT LONG TERM GOAL #2   Title Pt will be able to perform left knee flexion to >/= 125 degrees to improve functional mobility.    Status On-going    Target Date 11/05/20      PT LONG TERM GOAL #3   Title Pt will be able to walk community surfaces for >/= 30 minutes with no device with no pain reported.    Status Achieved      PT LONG TERM GOAL #4   Title Pt will improve her left LE knee flexion and extension to >/= 4+/5 in order to improve gait and functional mobility.    Status On-going    Target Date 11/05/20      PT LONG TERM GOAL #5   Title Pt will be able to amb up and down 5 steps safely with no hand rail.    Status Achieved      PT LONG TERM GOAL #6   Title FOTO outcome > or = 63    Status Achieved                 Plan - 10/07/20 1031    Clinical Impression Statement Pt. has attended 10 visits overall since start of treatment cycle.  Pt. has reported variable pain response in last week, mainly due to aggravation of  previous lateral hip/thigh complaints.  Overall Lt knee surgical pain improving.  See objective data for updated information.  Pt. has demonstrated good gains in Lt knee mobility reaching goals.  Continued skilled PT services to benefit Pt. to improve strength and function towards PLOF.    Personal Factors and Comorbidities Comorbidity 3+    Comorbidities COPD, arthritis, anemia, depression, HTN, neuromuscular disorder    Examination-Activity  Limitations Lift;Squat;Stairs;Stand;Other;Bend    Examination-Participation Restrictions Driving;Other;Occupation;Community Activity    Stability/Clinical Decision Making Stable/Uncomplicated    Rehab Potential Excellent    PT Frequency 2x / week    PT Duration 8 weeks    PT Treatment/Interventions ADLs/Self Care Home Management;Cryotherapy;Electrical Stimulation;Moist Heat;Ultrasound;Gait training;Stair training;Functional mobility training;Therapeutic activities;Therapeutic exercise;Balance training;Patient/family education;Neuromuscular re-education;Manual techniques;Scar mobilization;Passive range of motion;Taping;Vasopneumatic Device    PT Next Visit Plan Continue to improve quad strength, posterior/lateral Lt hip strength and endurance    PT Home Exercise Plan Access Code: [email or web address]  URL: [email or web address]  Date: 09/08/2020  Prepared by: Narda Amber    Exercises  Supine Bridge - 2 x daily - 7 x weekly - 2 sets - 10 reps  Straight Leg Raise - 2 x daily - 7 x weekly - 2 sets - 10 reps  Sit to Stand - 2 x daily - 7 x weekly - 2 sets - 10 reps  Seated Knee Flexion AAROM - 3 x daily - 7 x weekly - 2 sets - 10 reps  Heel Toe Raises with Counter Support - 2 x daily - 7 x weekly - 2 sets - 10 reps  Standing Heel Raise with Support - 2 x daily - 7 x weekly - 2 sets - 10 reps  Standing Hip Abduction with Counter Support - 2 x daily - 7 x weekly - 2 sets - 10 reps  Long Sitting Quad Set with Towel Roll Under Heel - 2 x daily - 7 x weekly - 2 sets - 10 reps - 5 seconds hold  Supine Heel Slide with Strap - 2 x daily - 7 x weekly - 2 sets - 10 reps - 5 seconds hold    Consulted and Agree with Plan of Care Patient           Patient will benefit from skilled therapeutic intervention in order to improve the following deficits and impairments:  Pain,Difficulty walking,Decreased balance,Increased edema,Impaired flexibility,Decreased strength,Decreased range of motion,Decreased activity  tolerance,Decreased mobility  Visit Diagnosis: Acute pain of left knee  Stiffness of left knee, not elsewhere classified  Localized edema  Difficulty in walking, not elsewhere classified     Problem List Patient Active Problem List   Diagnosis Date Noted  . Moderate persistent asthma without complication 09/17/2020  . Arthritis of left knee   . S/P total knee arthroplasty, left 08/17/2020  . Inflammatory arthritis 10/10/2019  . GAD (generalized anxiety disorder) 06/28/2018  . Primary osteoarthritis of left knee 04/17/2018  . Prediabetes 12/03/2017  . Irritable bowel 03/19/2017  . GERD without esophagitis 03/19/2017  . Fibromyalgia 11/28/2016  . Migraine without aura and without status migrainosus, not intractable 10/17/2016  . Essential hypertension 10/17/2016   Chyrel Masson, PT, DPT, OCS, ATC 10/07/20  10:58 AM    [facility] Physical Therapy [address] [place], [place], [address] Phone: [phone]   Fax:  [phone]  Name: Kristi Decker MRN: [id number] Date of Birth: 23-Apr-1976

## 2020-10-09 ENCOUNTER — Encounter: Payer: Self-pay | Admitting: Family Medicine

## 2020-10-12 ENCOUNTER — Encounter: Payer: Self-pay | Admitting: Physical Therapy

## 2020-10-12 ENCOUNTER — Other Ambulatory Visit: Payer: Self-pay

## 2020-10-12 ENCOUNTER — Ambulatory Visit: Payer: 59 | Admitting: Physical Therapy

## 2020-10-12 ENCOUNTER — Other Ambulatory Visit: Payer: Self-pay | Admitting: Family Medicine

## 2020-10-12 DIAGNOSIS — M25562 Pain in left knee: Secondary | ICD-10-CM

## 2020-10-12 DIAGNOSIS — R262 Difficulty in walking, not elsewhere classified: Secondary | ICD-10-CM

## 2020-10-12 DIAGNOSIS — M25662 Stiffness of left knee, not elsewhere classified: Secondary | ICD-10-CM

## 2020-10-12 DIAGNOSIS — R600 Localized edema: Secondary | ICD-10-CM

## 2020-10-12 MED ORDER — DICLOFENAC SODIUM 75 MG PO TBEC: 75.0000 mg | DELAYED_RELEASE_TABLET | Freq: Two times a day (BID) | ORAL | 0 refills | Status: AC

## 2020-10-12 MED ORDER — QUETIAPINE FUMARATE 25 MG PO TABS: 12.5000 mg | ORAL_TABLET | Freq: Every day | ORAL | 0 refills | Status: DC

## 2020-10-12 NOTE — Therapy (Addendum)
[facility] Physical Therapy [address]  [place], [place], [address] Phone: [phone]   Fax:  [phone]  Physical Therapy Treatment  Patient Details  Name: Kristi Decker MRN: [id number] Date of Birth: 1976/03/15 Referring Provider (PT): August Saucer Corrie Mckusick, MD   Encounter Date: 10/12/2020   PT End of Session - 10/12/20 1022    Visit Number 11    Number of Visits 16    Date for PT Re-Evaluation 11/05/20    Authorization Type [employer]    Progress Note Due on Visit 16    PT Start Time 1015    PT Stop Time 1055    PT Time Calculation (min) 40 min    Equipment Utilized During Treatment Gait belt    Activity Tolerance Patient tolerated treatment well    Behavior During Therapy [facility] for tasks assessed/performed           Past Medical History:  Diagnosis Date  . Anemia   . Anxiety   . Arthritis    left knee  . Asthma   . COPD (chronic obstructive pulmonary disease) (HCC)   . Depression   . GERD (gastroesophageal reflux disease)   . Hypertension   . Neuromuscular disorder (HCC)    right arm    Past Surgical History:  Procedure Laterality Date  . DILATION AND CURETTAGE OF UTERUS     2002  . TOTAL KNEE ARTHROPLASTY Left 08/17/2020   Procedure: LEFT TOTAL KNEE ARTHROPLASTY;  Surgeon: Cammy Copa, MD;  Location: [facility] OR;  Service: Orthopedics;  Laterality: Left;  . UPPER GI ENDOSCOPY    . WISDOM TOOTH EXTRACTION      There were no vitals filed for this visit.   Subjective Assessment - 10/12/20 1020    Subjective Pt arriving today with no pain reported.Pt reporting some lateral/hip thigh pain. Pt reporting the pain hits at times when she's not doing anything.    Pertinent History COPD, arthritis, anemia, depression, HTN, neuromuscular disorder    Limitations Standing;Walking;House hold activities    How long can you walk comfortably? 10 minutes with straight cane    Patient Stated Goals I want to get back to my normal self.     Currently in Pain? No/denies              [facility] PT Assessment - 10/12/20 0001      Assessment   Medical Diagnosis left TKA    Referring Provider (PT) August Saucer Corrie Mckusick, MD    Onset Date/Surgical Date 08/17/20    Hand Dominance Right      AROM   Left Knee Extension 0    Left Knee Flexion 114      PROM   Left Knee Flexion 118                         [facility] Adult PT Treatment/Exercise - 10/12/20 0001      Knee/Hip Exercises: Stretches   Gastroc Stretch 30 seconds;5 reps;Both   incline board     Knee/Hip Exercises: Aerobic   Recumbent Bike L3 x 10 minutes      Knee/Hip Exercises: Machines for Strengthening   Cybex Knee Extension Eccentric lowering Lt leg 3 x 10  10 lbs    Cybex Knee Flexion SL left 3 x 10 15 lbs    Cybex Leg Press SL: 62# 2x10, Double leg: 106# 2x10      Knee/Hip Exercises: Standing   Lateral Step Up 2 sets;Both;10 reps;Step  Height: 6";Hand Hold: 0      Knee/Hip Exercises: Seated   Sit to Sand 15 reps      Knee/Hip Exercises: Supine   Bridges Both;2 sets;10 reps    Straight Leg Raises Left;2 sets;10 reps   eccentric lowering focus     Knee/Hip Exercises: Sidelying   Hip ABduction Left;2 sets;10 reps                    PT Short Term Goals - 10/12/20 1027      PT SHORT TERM GOAL #1   Title Pt will be independent in her initial HEP.    Status Achieved      PT SHORT TERM GOAL #2   Title Pt will be able to perform 5 time sit to stand in </= 12 seconds with no UE support.    Status Achieved             PT Long Term Goals - 10/12/20 1027      PT LONG TERM GOAL #1   Title Pt will be independent in her advanced HEP.    Status On-going      PT LONG TERM GOAL #2   Title Pt will be able to perform left knee flexion to >/= 125 degrees to improve functional mobility.    Status On-going      PT LONG TERM GOAL #3   Title Pt will be able to walk community surfaces for >/= 30 minutes with no device with no pain  reported.    Baseline pt amb community distances > 30 minutes with no device    Status Achieved      PT LONG TERM GOAL #4   Title Pt will improve her left LE knee flexion and extension to >/= 4+/5 in order to improve gait and functional mobility.    Baseline 4-/5 on 09/08/2020    Status On-going      PT LONG TERM GOAL #5   Title Pt will be able to amb up and down 5 steps safely with no hand rail.    Status Achieved                 Plan - 10/12/20 1022    Clinical Impression Statement Pt still reporting intermittent pain in left lateral hip/thigh at times which is unpredictable. Pt progressing with overall strength and moving toward goals. Continue skilled PT services to maximize function.    Personal Factors and Comorbidities Comorbidity 3+    Comorbidities COPD, arthritis, anemia, depression, HTN, neuromuscular disorder    Examination-Activity Limitations Lift;Squat;Stairs;Stand;Other;Bend    Examination-Participation Restrictions Driving;Other;Occupation;Community Activity    Stability/Clinical Decision Making Stable/Uncomplicated    Rehab Potential Excellent    PT Frequency 2x / week    PT Duration 8 weeks    PT Treatment/Interventions ADLs/Self Care Home Management;Cryotherapy;Electrical Stimulation;Moist Heat;Ultrasound;Gait training;Stair training;Functional mobility training;Therapeutic activities;Therapeutic exercise;Balance training;Patient/family education;Neuromuscular re-education;Manual techniques;Scar mobilization;Passive range of motion;Taping;Vasopneumatic Device    PT Next Visit Plan Continue to improve quad strength, posterior/lateral Lt hip strength and endurance    PT Home Exercise Plan Access Code: [email or web address]  URL: [email or web address]  Date: 09/08/2020  Prepared by: Narda Amber    Exercises  Supine Bridge - 2 x daily - 7 x weekly - 2 sets - 10 reps  Straight Leg Raise - 2 x daily - 7 x weekly - 2 sets - 10 reps  Sit to Stand - 2 x daily - 7 x  weekly - 2 sets -  10 reps  Seated Knee Flexion AAROM - 3 x daily - 7 x weekly - 2 sets - 10 reps  Heel Toe Raises with Counter Support - 2 x daily - 7 x weekly - 2 sets - 10 reps  Standing Heel Raise with Support - 2 x daily - 7 x weekly - 2 sets - 10 reps  Standing Hip Abduction with Counter Support - 2 x daily - 7 x weekly - 2 sets - 10 reps  Long Sitting Quad Set with Towel Roll Under Heel - 2 x daily - 7 x weekly - 2 sets - 10 reps - 5 seconds hold  Supine Heel Slide with Strap - 2 x daily - 7 x weekly - 2 sets - 10 reps - 5 seconds hold    Consulted and Agree with Plan of Care Patient           Patient will benefit from skilled therapeutic intervention in order to improve the following deficits and impairments:  Pain,Difficulty walking,Decreased balance,Increased edema,Impaired flexibility,Decreased strength,Decreased range of motion,Decreased activity tolerance,Decreased mobility  Visit Diagnosis: Acute pain of left knee  Stiffness of left knee, not elsewhere classified  Localized edema  Difficulty in walking, not elsewhere classified     Problem List Patient Active Problem List   Diagnosis Date Noted  . Moderate persistent asthma without complication 09/17/2020  . Arthritis of left knee   . S/P total knee arthroplasty, left 08/17/2020  . Inflammatory arthritis 10/10/2019  . GAD (generalized anxiety disorder) 06/28/2018  . Primary osteoarthritis of left knee 04/17/2018  . Prediabetes 12/03/2017  . Irritable bowel 03/19/2017  . GERD without esophagitis 03/19/2017  . Fibromyalgia 11/28/2016  . Migraine without aura and without status migrainosus, not intractable 10/17/2016  . Essential hypertension 10/17/2016    Sharmon Leyden, PT, MPT 10/12/2020, 10:55 AM  [facility] Physical Therapy [address] [place], [place], [address] Phone: [phone]   Fax:  [phone]  Name: Kristi Decker MRN: [id number] Date of Birth: 14-Nov-1975

## 2020-10-13 ENCOUNTER — Other Ambulatory Visit: Payer: Self-pay | Admitting: Surgical

## 2020-10-14 ENCOUNTER — Other Ambulatory Visit: Payer: Self-pay

## 2020-10-14 ENCOUNTER — Ambulatory Visit (INDEPENDENT_AMBULATORY_CARE_PROVIDER_SITE_OTHER): Payer: 59 | Admitting: Rehabilitative and Restorative Service Providers"

## 2020-10-14 DIAGNOSIS — M25662 Stiffness of left knee, not elsewhere classified: Secondary | ICD-10-CM

## 2020-10-14 DIAGNOSIS — R262 Difficulty in walking, not elsewhere classified: Secondary | ICD-10-CM

## 2020-10-14 DIAGNOSIS — M25562 Pain in left knee: Secondary | ICD-10-CM | POA: Diagnosis not present

## 2020-10-14 DIAGNOSIS — R600 Localized edema: Secondary | ICD-10-CM | POA: Diagnosis not present

## 2020-10-14 NOTE — Therapy (Addendum)
[facility] Physical Therapy [address] [place], [place], [address] Phone: [phone]   Fax:  [phone]  Physical Therapy Treatment Discharge  Patient Details  Name: Kristi Decker MRN: [id number] Date of Birth: 1975/12/05 Referring Provider (PT): Marlou Sa Tonna Corner, MD   Encounter Date: 10/14/2020   PT End of Session - 10/14/20 1009     Visit Number 12    Number of Visits 16    Date for PT Re-Evaluation 11/05/20    Authorization Type [employer]    Progress Note Due on Visit 16    PT Start Time 1010    PT Stop Time 1053    PT Time Calculation (min) 43 min    Equipment Utilized During Treatment --    Activity Tolerance Patient tolerated treatment well    Behavior During Therapy [facility] for tasks assessed/performed             Past Medical History:  Diagnosis Date   Anemia    Anxiety    Arthritis    left knee   Asthma    COPD (chronic obstructive pulmonary disease) ([place])    Depression    GERD (gastroesophageal reflux disease)    Hypertension    Neuromuscular disorder ([place])    right arm    Past Surgical History:  Procedure Laterality Date   DILATION AND CURETTAGE OF UTERUS     2002   TOTAL KNEE ARTHROPLASTY Left 08/17/2020   Procedure: LEFT TOTAL KNEE ARTHROPLASTY;  Surgeon: Meredith Pel, MD;  Location: [place];  Service: Orthopedics;  Laterality: Left;   UPPER GI ENDOSCOPY     WISDOM TOOTH EXTRACTION      There were no vitals filed for this visit.   Subjective Assessment - 10/14/20 1022     Subjective Pt. stated 1/10 complaints today in knee.  Reported due to financial reasons, she will have to plan on today being the last visit for now.  Pt. also reported stress and anxiety related to a/c related problems at home and arrived right after phone call that created her a panic attack.    Pertinent History COPD, arthritis, anemia, depression, HTN, neuromuscular disorder    Limitations Standing;Walking;House hold activities     How long can you walk comfortably? 10 minutes with straight cane    Patient Stated Goals I want to get back to my normal self.    Currently in Pain? Yes    Pain Score 1     Pain Location Knee    Pain Orientation Left    Pain Descriptors / Indicators Sore;Aching    Pain Type Acute pain    Pain Onset More than a month ago    Pain Frequency Intermittent    Aggravating Factors  insidious soreness    Pain Relieving Factors icing, pain medications                [facility] PT Assessment - 10/14/20 0001       Single Leg Stance   Comments Lt : 20 seconds      AROM   Left Knee Extension 0    Left Knee Flexion 115      Strength   Right Knee Extension 5/5   avg 62 lbs   Left Knee Extension 5/5   52.1, 52.8 lbs                          [facility] Adult PT Treatment/Exercise - 10/14/20 0001  Knee/Hip Exercises: Clinical research associate 30 seconds;5 reps;Both      Knee/Hip Exercises: Aerobic   Recumbent Bike Lvl 3 10 mins      Knee/Hip Exercises: Machines for Strengthening   Cybex Knee Extension Eccentric lowering Lt leg 3 x 10  10 lbs    Cybex Knee Flexion SL left 3 x 10 15 lbs      Knee/Hip Exercises: Seated   Sit to Sand 20 reps;without UE support   slow lowering focus                     PT Short Term Goals - 10/12/20 1027       PT SHORT TERM GOAL #1   Title Pt will be independent in her initial HEP.    Status Achieved      PT SHORT TERM GOAL #2   Title Pt will be able to perform 5 time sit to stand in </= 12 seconds with no UE support.    Status Achieved               PT Long Term Goals - 10/14/20 1047       PT LONG TERM GOAL #1   Title Pt will be independent in her advanced HEP.    Status Achieved      PT LONG TERM GOAL #2   Title Pt will be able to perform left knee flexion to >/= 125 degrees to improve functional mobility.    Status Partially Met      PT LONG TERM GOAL #3   Title Pt will be able to walk community  surfaces for >/= 30 minutes with no device with no pain reported.    Baseline pt amb community distances > 30 minutes with no device    Status Achieved      PT LONG TERM GOAL #4   Title Pt will improve her left LE knee flexion and extension to >/= 4+/5 in order to improve gait and functional mobility.    Baseline 4-/5 on 09/08/2020    Status Achieved      PT LONG TERM GOAL #5   Title Pt will be able to amb up and down 5 steps safely with no hand rail.    Status Achieved                   Plan - 10/14/20 1040     Clinical Impression Statement Pt. indicated desire to make today tenatively her last viist due to financial reasons at this time.  Pt. has demonstrated marked improvement in objective data compared to evaluation, reaching ROM goals and making good improvement in strength as documented.  Pt. may continue to benefit from HEP use for continued strength gains and balance improvements and could still benefit from skilled PT services but it is acceptable at this time for trial time of HEP per Pt.'s request.    Personal Factors and Comorbidities Comorbidity 3+    Comorbidities COPD, arthritis, anemia, depression, HTN, neuromuscular disorder    Examination-Activity Limitations Lift;Squat;Stairs;Stand;Other;Bend    Examination-Participation Restrictions Driving;Other;Occupation;Community Activity    Stability/Clinical Decision Making Stable/Uncomplicated    Rehab Potential Excellent    PT Frequency 2x / week    PT Duration 8 weeks    PT Treatment/Interventions ADLs/Self Care Home Management;Cryotherapy;Electrical Stimulation;Moist Heat;Ultrasound;Gait training;Stair training;Functional mobility training;Therapeutic activities;Therapeutic exercise;Balance training;Patient/family education;Neuromuscular re-education;Manual techniques;Scar mobilization;Passive range of motion;Taping;Vasopneumatic Device    PT Next Visit Plan Hold PT due to Pt.  request, plan to d/c after 30 days if no  return.    PT Home Exercise Plan Access Code: [email or web address]  URL: [email or web address]  Date: 09/08/2020  Prepared by: Kearney Hard    Exercises  Supine Bridge - 2 x daily - 7 x weekly - 2 sets - 10 reps  Straight Leg Raise - 2 x daily - 7 x weekly - 2 sets - 10 reps  Sit to Stand - 2 x daily - 7 x weekly - 2 sets - 10 reps  Seated Knee Flexion AAROM - 3 x daily - 7 x weekly - 2 sets - 10 reps  Heel Toe Raises with Counter Support - 2 x daily - 7 x weekly - 2 sets - 10 reps  Standing Heel Raise with Support - 2 x daily - 7 x weekly - 2 sets - 10 reps  Standing Hip Abduction with Counter Support - 2 x daily - 7 x weekly - 2 sets - 10 reps  Long Sitting Quad Set with Towel Roll Under Heel - 2 x daily - 7 x weekly - 2 sets - 10 reps - 5 seconds hold  Supine Heel Slide with Strap - 2 x daily - 7 x weekly - 2 sets - 10 reps - 5 seconds hold    Consulted and Agree with Plan of Care Patient             Patient will benefit from skilled therapeutic intervention in order to improve the following deficits and impairments:  Pain,Difficulty walking,Decreased balance,Increased edema,Impaired flexibility,Decreased strength,Decreased range of motion,Decreased activity tolerance,Decreased mobility  Visit Diagnosis: Acute pain of left knee  Stiffness of left knee, not elsewhere classified  Localized edema  Difficulty in walking, not elsewhere classified  PHYSICAL THERAPY DISCHARGE SUMMARY  Visits from Start of Care: 12  Current functional level related to goals / functional outcomes: See above   Remaining deficits: See above   Education / Equipment: HEP   Patient agrees to discharge. Patient goals were partially met. Patient is being discharged due to financial reasons.  Problem List Patient Active Problem List   Diagnosis Date Noted   Moderate persistent asthma without complication 53/20/2334   Arthritis of left knee    S/P total knee arthroplasty, left 08/17/2020    Inflammatory arthritis 10/10/2019   GAD (generalized anxiety disorder) 06/28/2018   Primary osteoarthritis of left knee 04/17/2018   Prediabetes 12/03/2017   Irritable bowel 03/19/2017   GERD without esophagitis 03/19/2017   Fibromyalgia 11/28/2016   Migraine without aura and without status migrainosus, not intractable 10/17/2016   Essential hypertension 10/17/2016    Scot Jun, PT, DPT, OCS, ATC 10/14/20  10:51 AM    [facility] Physical Therapy [address] [place], [place], [address] Phone: [phone]   Fax:  [phone]  Name: Amarra Sawyer MRN: [id number] Date of Birth: 1976-04-27

## 2020-10-18 ENCOUNTER — Other Ambulatory Visit: Payer: Self-pay | Admitting: Family Medicine

## 2020-10-18 ENCOUNTER — Other Ambulatory Visit: Payer: Self-pay | Admitting: Surgical

## 2020-10-18 DIAGNOSIS — Z1231 Encounter for screening mammogram for malignant neoplasm of breast: Secondary | ICD-10-CM

## 2020-10-18 NOTE — Telephone Encounter (Signed)
Pls advise.  

## 2020-10-21 ENCOUNTER — Other Ambulatory Visit: Payer: Self-pay

## 2020-10-22 ENCOUNTER — Encounter: Payer: Self-pay | Admitting: Physical Medicine and Rehabilitation

## 2020-10-22 ENCOUNTER — Telehealth: Payer: Self-pay

## 2020-10-22 ENCOUNTER — Ambulatory Visit (INDEPENDENT_AMBULATORY_CARE_PROVIDER_SITE_OTHER): Payer: 59 | Admitting: Physical Medicine and Rehabilitation

## 2020-10-22 DIAGNOSIS — R202 Paresthesia of skin: Secondary | ICD-10-CM | POA: Diagnosis not present

## 2020-10-22 NOTE — Progress Notes (Signed)
Fell in January and "did something to her neck." Has been having numbness and tingling in right arm and hand since then. Wakes her up at night.  Right hand dominant No lotion per patient Numeric Pain Rating Scale and Functional Assessment Average Pain 10   In the last MONTH (on 0-10 scale) has pain interfered with the following?  1. General activity like being  able to carry out your everyday physical activities such as walking, climbing stairs, carrying groceries, or moving a chair?  Rating(10)

## 2020-10-22 NOTE — Telephone Encounter (Signed)
Waiting on PA from health plan

## 2020-10-22 NOTE — Telephone Encounter (Signed)
What is the status on MRI?

## 2020-10-22 NOTE — Telephone Encounter (Signed)
Pt came into the office upset due to the fact that she has yet to hear anything regarding her MRI or getting it set up.   Please advise

## 2020-10-25 NOTE — Procedures (Signed)
EMG & NCV Findings: Evaluation of the right median motor nerve showed prolonged distal onset latency (8.1 ms) and decreased conduction velocity (Elbow-Wrist, 45 m/s).  The right median (across palm) sensory nerve showed prolonged distal peak latency (Wrist, 9.3 ms), reduced amplitude (3.3 V), and prolonged distal peak latency (Palm, 2.9 ms).  All remaining nerves (as indicated in the following tables) were within normal limits.    All examined muscles (as indicated in the following table) showed no evidence of electrical instability.    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   There is no significant electrodiagnostic evidence of any other focal nerve entrapment, brachial plexopathy or cervical radiculopathy.  As you know, this particular electrodiagnostic study cannot rule out chemical radiculitis or sensory only radiculopathy.  Recommendations: 1.  Follow-up with referring physician. 2.  Continue current management of symptoms. 3.  Suggest surgical evaluation.  ___________________________ Naaman Plummer FAAPMR Board Certified, American Board of Physical Medicine and Rehabilitation    Nerve Conduction Studies Anti Sensory Summary Table   Stim Site NR Peak (ms) Norm Peak (ms) P-T Amp (V) Norm P-T Amp Site1 Site2 Delta-P (ms) Dist (cm) Vel (m/s) Norm Vel (m/s)  Right Median Acr Palm Anti Sensory (2nd Digit)  30.9C  Wrist    *9.3 <3.6 *3.3 >10 Wrist Palm 6.4 0.0    Palm    *2.9 <2.0 2.6         Right Radial Anti Sensory (Base 1st Digit)  30.9C  Wrist    1.9 <3.1 35.5  Wrist Base 1st Digit 1.9 0.0    Right Ulnar Anti Sensory (5th Digit)  31.3C  Wrist    2.9 <3.7 28.5 >15.0 Wrist 5th Digit 2.9 14.0 48 >38   Motor Summary Table   Stim Site NR Onset (ms) Norm Onset (ms) O-P Amp (mV) Norm O-P Amp Site1 Site2 Delta-0 (ms) Dist  (cm) Vel (m/s) Norm Vel (m/s)  Right Median Motor (Abd Poll Brev)  31C  Wrist    *8.1 <4.2 5.0 >5 Elbow Wrist 4.6 20.5 *45 >50  Elbow    12.7  2.3         Right Ulnar Motor (Abd Dig Min)  30.8C  Wrist    2.1 <4.2 8.2 >3 B Elbow Wrist 3.5 20.5 59 >53  B Elbow    5.6  8.7  A Elbow B Elbow 1.2 10.0 83 >53  A Elbow    6.8  8.5          EMG   Side Muscle Nerve Root Ins Act Fibs Psw Amp Dur Poly Recrt Int Dennie Bible Comment  Right 1stDorInt Ulnar C8-T1 Nml Nml Nml Nml Nml 0 Nml Nml   Right Abd Poll Brev Median C8-T1 Nml Nml Nml Nml Nml 0 Nml Nml   Right ExtDigCom   Nml Nml Nml Nml Nml 0 Nml Nml   Right Triceps Radial C6-7-8 Nml Nml Nml Nml Nml 0 Nml Nml   Right Deltoid Axillary C5-6 Nml Nml Nml Nml Nml 0 Nml Nml     Nerve Conduction Studies Anti Sensory Left/Right Comparison   Stim Site L Lat (ms) R Lat (ms) L-R Lat (ms) L Amp (V) R Amp (V) L-R Amp (%) Site1 Site2 L Vel (m/s) R Vel (m/s) L-R Vel (m/s)  Median Acr Palm Anti Sensory (2nd Digit)  30.9C  Wrist  *9.3   *3.3  Wrist Palm     Palm  *2.9   2.6        Radial Anti Sensory (Base 1st Digit)  30.9C  Wrist  1.9   35.5  Wrist Base 1st Digit     Ulnar Anti Sensory (5th Digit)  31.3C  Wrist  2.9   28.5  Wrist 5th Digit  48    Motor Left/Right Comparison   Stim Site L Lat (ms) R Lat (ms) L-R Lat (ms) L Amp (mV) R Amp (mV) L-R Amp (%) Site1 Site2 L Vel (m/s) R Vel (m/s) L-R Vel (m/s)  Median Motor (Abd Poll Brev)  31C  Wrist  *8.1   5.0  Elbow Wrist  *45   Elbow  12.7   2.3        Ulnar Motor (Abd Dig Min)  30.8C  Wrist  2.1   8.2  B Elbow Wrist  59   B Elbow  5.6   8.7  A Elbow B Elbow  83   A Elbow  6.8   8.5           Waveforms:

## 2020-10-26 NOTE — Telephone Encounter (Signed)
Pt insurance denied facility- resubmitted for [facility] facility. Pending auth

## 2020-10-27 ENCOUNTER — Other Ambulatory Visit: Payer: Self-pay

## 2020-10-27 ENCOUNTER — Ambulatory Visit (INDEPENDENT_AMBULATORY_CARE_PROVIDER_SITE_OTHER): Payer: 59

## 2020-10-27 DIAGNOSIS — Z1231 Encounter for screening mammogram for malignant neoplasm of breast: Secondary | ICD-10-CM

## 2020-10-27 IMAGING — MG MM DIGITAL SCREENING BILAT W/ TOMO AND CAD
8 series · 8 of 24 positions shown · non-contrast
Comparison: Previous exam(s).

CLINICAL DATA: Screening.

EXAM:
DIGITAL SCREENING BILATERAL MAMMOGRAM WITH TOMOSYNTHESIS AND CAD
TECHNIQUE: Bilateral screening digital craniocaudal and mediolateral oblique
mammograms were obtained. Bilateral screening digital breast
tomosynthesis was performed. The images were evaluated with
computer-aided detection.

[R CC synth-2D]
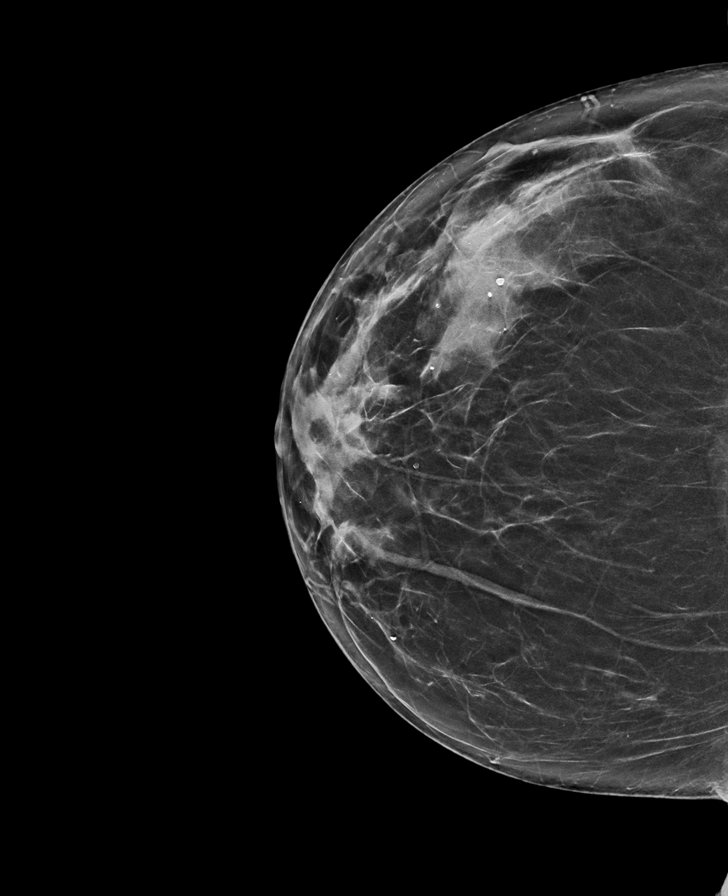

[R MLO synth-2D]
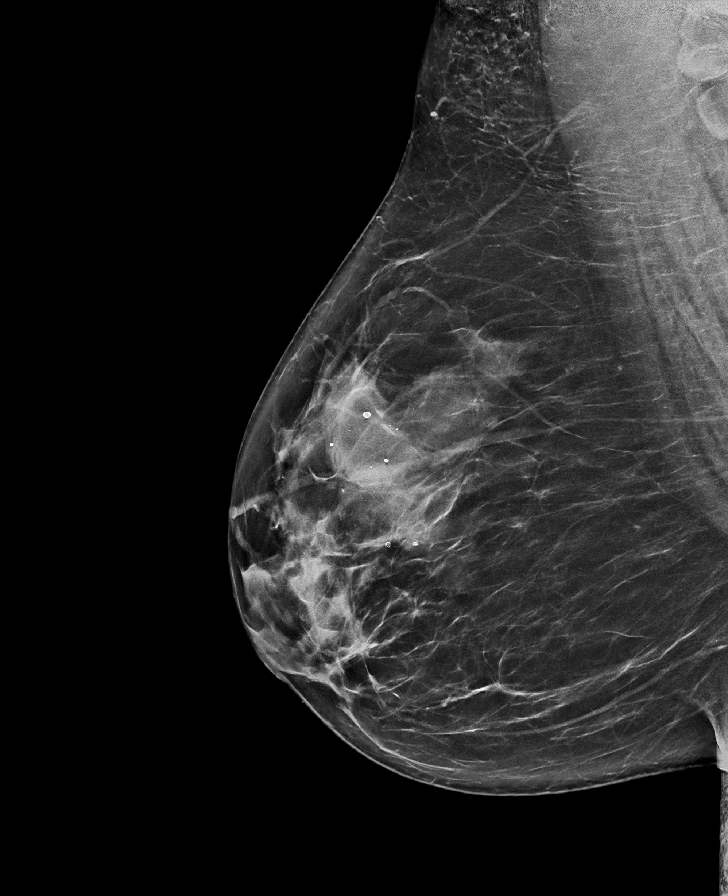

[L CC synth-2D]
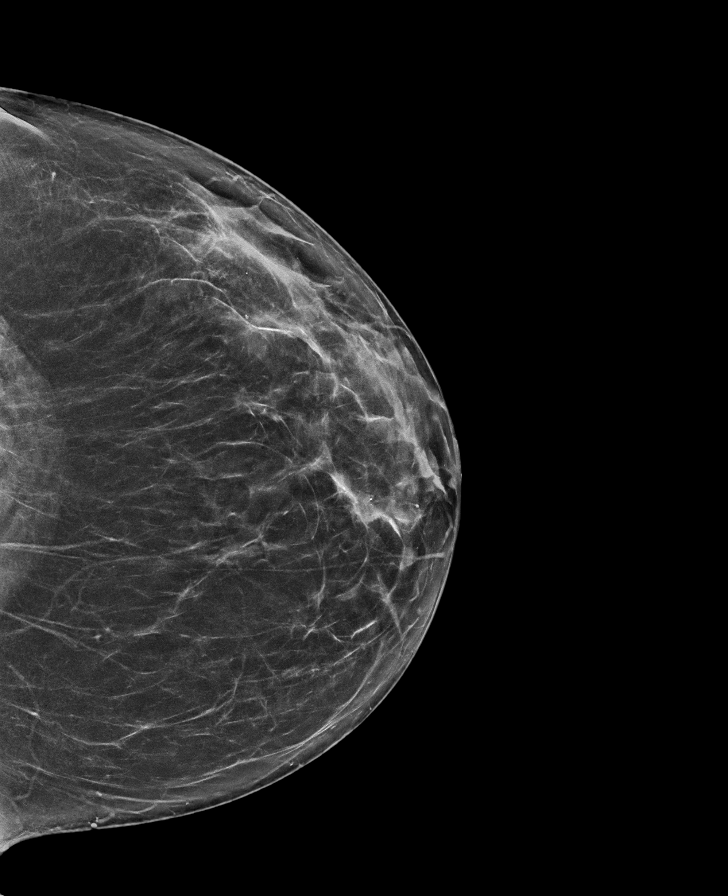

[L MLO synth-2D]
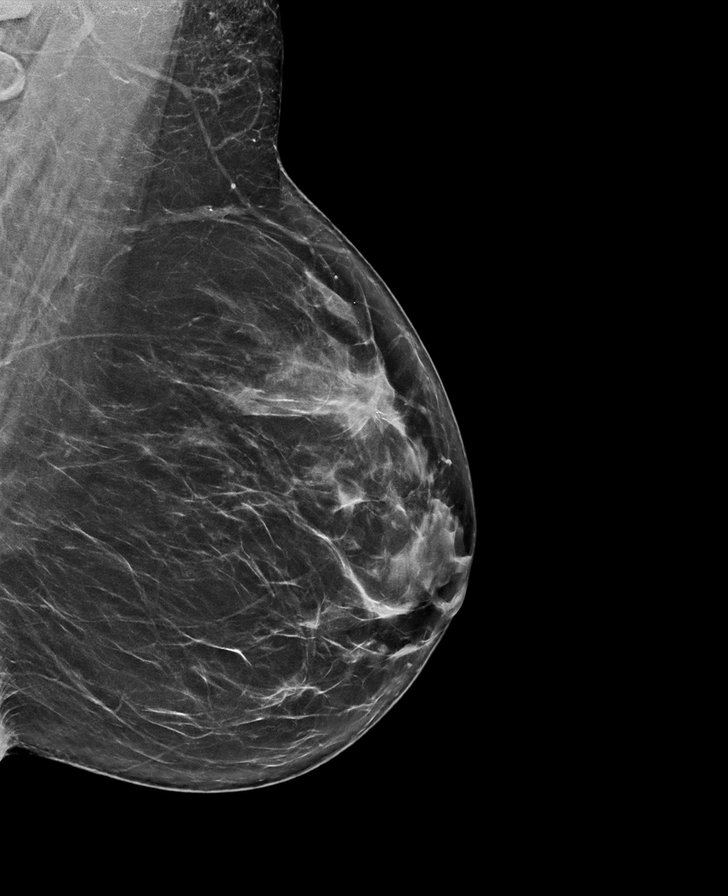

[R MLO tomo · tomo slice 40/79.0]
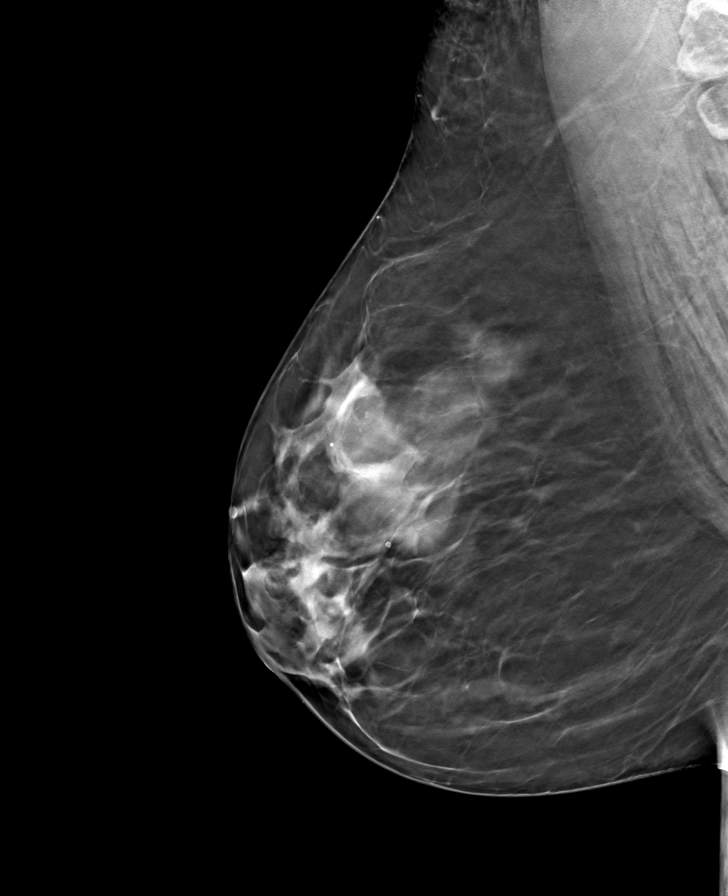

[R CC tomo · tomo slice 35/68.0]
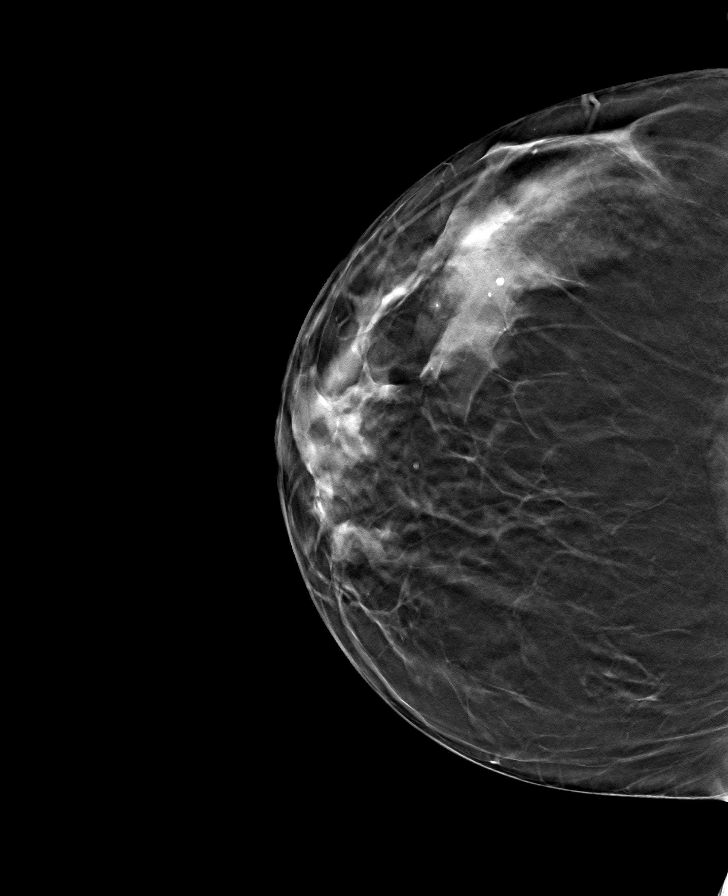

[L CC tomo · tomo slice 37/72.0]
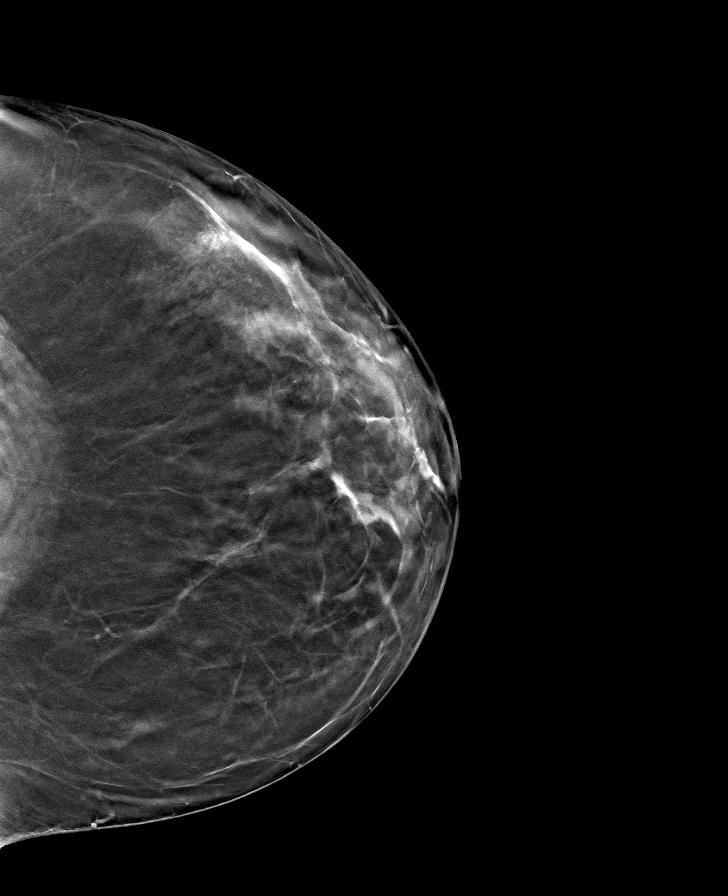

[L MLO tomo · tomo slice 40/79.0]
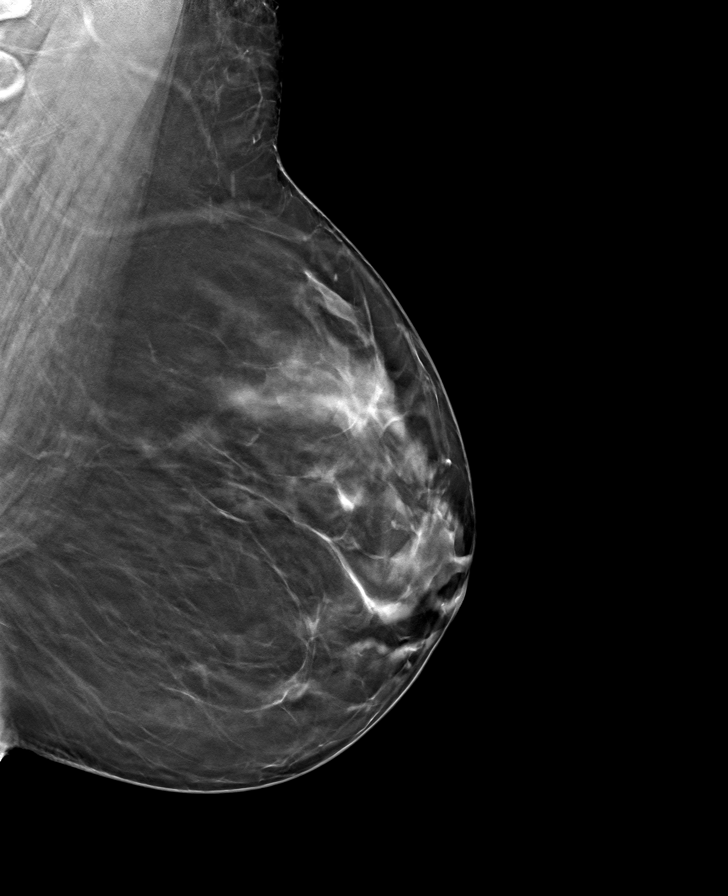

[8 of 24 positions shown; findings below may reference images not displayed]

ACR Breast Density Category c: The breast tissue is heterogeneously
dense, which may obscure small masses.
FINDINGS: There are no findings suspicious for malignancy.
IMPRESSION: No mammographic evidence of malignancy. A result letter of this
screening mammogram will be mailed directly to the patient.

RECOMMENDATION:
Screening mammogram in one year. (Code:[V2])

BI-RADS CATEGORY  1: Negative.

## 2020-10-27 NOTE — Progress Notes (Signed)
Kristi Decker - 45 y.o. female MRN [id number]  Date of birth: [birth year]  Office Visit Note: Visit Date: 10/22/2020 PCP: Avanell Shackleton, NP-C Referred by: Avanell Shackleton, NP-C  Subjective: Chief Complaint  Patient presents with   Right Arm - Numbness   HPI:  Kristi Decker is a 45 y.o. female who comes in today at the request of Dr. Burnard Bunting for electrodiagnostic study of the Right upper extremities.  Patient is Right hand dominant.  She reports 10 out of 10 pain with numbness and tingling in the right arm and hand somewhat globally without specific dermatomal pattern or peripheral nerve distribution.  She does report that it wakes her up at night.  She has not found relief with the conservative care and medication management today.  She reports that she "did something to her neck "from a fall in January.  Dr. Diamantina Providence notes can be fully reviewed on this.  She denies any left arm or hand complaints.  No prior electrodiagnostic studies.  MRI of the cervical spine is pending.   ROS Otherwise per HPI.  Assessment & Plan: Visit Diagnoses:    ICD-10-CM   1. 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   There is no significant electrodiagnostic evidence of any other focal nerve entrapment, brachial plexopathy or cervical radiculopathy.  As you know, this particular electrodiagnostic study cannot rule out chemical radiculitis or sensory only radiculopathy.  Recommendations: 1.  Follow-up with referring physician. 2.  Continue current management of symptoms. 3.  Suggest surgical evaluation.  Meds & Orders: No orders of the defined types were placed in this encounter.   Orders Placed This Encounter  Procedures   NCV with EMG (electromyography)     Follow-up: Return in about 2 weeks (around 11/05/2020) for  Burnard Bunting, MD.   Procedures: No procedures performed  EMG & NCV Findings: Evaluation of the right median motor nerve showed prolonged distal onset latency (8.1 ms) and decreased conduction velocity (Elbow-Wrist, 45 m/s).  The right median (across palm) sensory nerve showed prolonged distal peak latency (Wrist, 9.3 ms), reduced amplitude (3.3 V), and prolonged distal peak latency (Palm, 2.9 ms).  All remaining nerves (as indicated in the following tables) were within normal limits.    All examined muscles (as indicated in the following table) showed no evidence of electrical instability.    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   There is no significant electrodiagnostic evidence of any other focal nerve entrapment, brachial plexopathy or cervical radiculopathy.  As you know, this particular electrodiagnostic study cannot rule out chemical radiculitis or sensory only radiculopathy.  Recommendations: 1.  Follow-up with referring physician. 2.  Continue current management of symptoms. 3.  Suggest surgical evaluation.  ___________________________ Naaman Plummer FAAPMR Board Certified, American Board of Physical Medicine and Rehabilitation    Nerve Conduction Studies Anti Sensory Summary Table   Stim Site NR Peak (ms) Norm Peak (ms) P-T Amp (V) Norm P-T Amp Site1 Site2 Delta-P (ms) Dist (cm) Vel (m/s) Norm Vel (m/s)  Right Median Acr Palm Anti Sensory (2nd Digit)  30.9C  Wrist    *9.3 <3.6 *3.3 >10 Wrist Palm  6.4 0.0    Palm    *2.9 <2.0 2.6         Right Radial Anti Sensory (Base 1st Digit)  30.9C  Wrist    1.9 <3.1 35.5  Wrist Base 1st Digit 1.9 0.0    Right Ulnar Anti Sensory (5th Digit)  31.3C  Wrist    2.9 <3.7 28.5 >15.0 Wrist 5th Digit 2.9 14.0 48 >38    Motor Summary Table   Stim Site NR Onset (ms) Norm Onset (ms) O-P Amp (mV) Norm O-P Amp Site1 Site2 Delta-0 (ms) Dist (cm) Vel (m/s) Norm Vel (m/s)  Right Median Motor (Abd Poll Brev)  31C  Wrist    *8.1 <4.2 5.0 >5 Elbow Wrist 4.6 20.5 *45 >50  Elbow    12.7  2.3         Right Ulnar Motor (Abd Dig Min)  30.8C  Wrist    2.1 <4.2 8.2 >3 B Elbow Wrist 3.5 20.5 59 >53  B Elbow    5.6  8.7  A Elbow B Elbow 1.2 10.0 83 >53  A Elbow    6.8  8.5          EMG   Side Muscle Nerve Root Ins Act Fibs Psw Amp Dur Poly Recrt Int Dennie Bible Comment  Right 1stDorInt Ulnar C8-T1 Nml Nml Nml Nml Nml 0 Nml Nml   Right Abd Poll Brev Median C8-T1 Nml Nml Nml Nml Nml 0 Nml Nml   Right ExtDigCom   Nml Nml Nml Nml Nml 0 Nml Nml   Right Triceps Radial C6-7-8 Nml Nml Nml Nml Nml 0 Nml Nml   Right Deltoid Axillary C5-6 Nml Nml Nml Nml Nml 0 Nml Nml     Nerve Conduction Studies Anti Sensory Left/Right Comparison   Stim Site L Lat (ms) R Lat (ms) L-R Lat (ms) L Amp (V) R Amp (V) L-R Amp (%) Site1 Site2 L Vel (m/s) R Vel (m/s) L-R Vel (m/s)  Median Acr Palm Anti Sensory (2nd Digit)  30.9C  Wrist  *9.3   *3.3  Wrist Palm     Palm  *2.9   2.6        Radial Anti Sensory (Base 1st Digit)  30.9C  Wrist  1.9   35.5  Wrist Base 1st Digit     Ulnar Anti Sensory (5th Digit)  31.3C  Wrist  2.9   28.5  Wrist 5th Digit  48    Motor Left/Right Comparison   Stim Site L Lat (ms) R Lat (ms) L-R Lat (ms) L Amp (mV) R Amp (mV) L-R Amp (%) Site1 Site2 L Vel (m/s) R Vel (m/s) L-R Vel (m/s)  Median Motor (Abd Poll Brev)  31C  Wrist  *8.1   5.0  Elbow Wrist  *45   Elbow  12.7   2.3        Ulnar Motor (Abd Dig Min)  30.8C  Wrist  2.1   8.2  B Elbow Wrist  59   B Elbow  5.6   8.7  A Elbow B Elbow  83   A Elbow  6.8   8.5           Waveforms:            Clinical History: No specialty comments available.     Objective:  VS:  HT:    WT:   BMI:     BP:   HR: bpm  TEMP: ( )  RESP:  Physical  Exam  Musculoskeletal:        General: No swelling, tenderness or deformity.     Comments: Inspection reveals no atrophy of the bilateral APB or FDI or hand intrinsics. There is no swelling, color changes, allodynia or dystrophic changes. There is 5 out of 5 strength in the bilateral wrist extension, finger abduction and long finger flexion. There is intact sensation to light touch in all dermatomal and peripheral nerve distributions. There is a negative Hoffmann's test bilaterally.  Skin:    General: Skin is warm and dry.     Findings: No erythema or rash.  Neurological:     General: No focal deficit present.     Mental Status: She is alert and oriented to person, place, and time.     Motor: No weakness or abnormal muscle tone.     Coordination: Coordination normal.  Psychiatric:        Mood and Affect: Mood normal.        Behavior: Behavior normal.     Imaging: No results found.

## 2020-10-28 ENCOUNTER — Ambulatory Visit: Payer: 59

## 2020-11-09 ENCOUNTER — Other Ambulatory Visit: Payer: Self-pay

## 2020-11-09 ENCOUNTER — Ambulatory Visit (HOSPITAL_COMMUNITY)
Admission: RE | Admit: 2020-11-09 | Discharge: 2020-11-09 | Disposition: A | Discharge status: Home / Self Care | Payer: 59 | Source: Ambulatory Visit | Attending: Surgical | Admitting: Surgical

## 2020-11-09 DIAGNOSIS — M542 Cervicalgia: Secondary | ICD-10-CM | POA: Diagnosis present

## 2020-11-09 IMAGING — MR MR CERVICAL SPINE W/O CM
5 of 6 series · 33 of 48 positions shown · non-contrast
Comparison: Radiographs of the cervical spine [DATE] (images
available, report unavailable).

CLINICAL DATA: Neck pain, acute. Evaluate for source of right arm
radicular pain.

EXAM:
MRI CERVICAL SPINE WITHOUT CONTRAST
TECHNIQUE: Multiplanar, multisequence MR imaging of the cervical spine was
performed. No intravenous contrast was administered.

[Series 5: T1 · sagittal · 3.0mm · 0.69mm/px · 5 of 15 slices shown (1 of 2)]
[im 1/15]
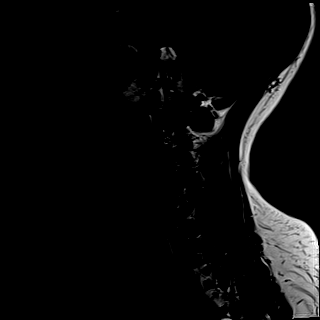
[im 4/15]
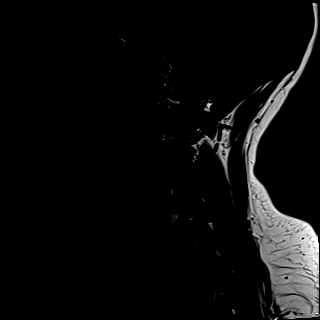
[im 8/15]
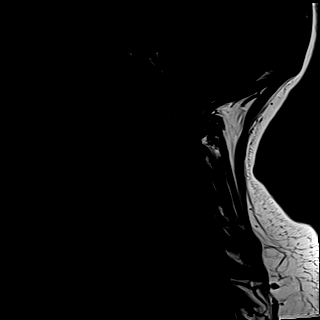
[im 11/15]
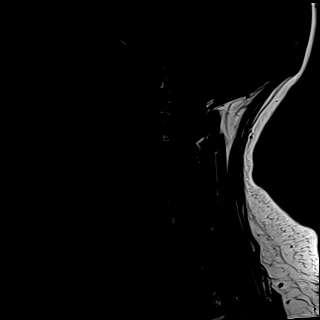
[im 15/15]
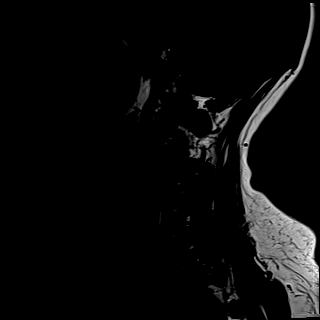

[Series 6: T2 · sagittal · 3.0mm · 0.69mm/px · 5 of 15 slices shown (1 of 2)]
[im 1/15]
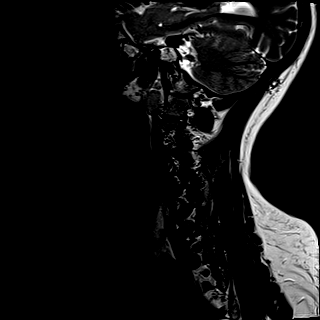
[im 4/15]
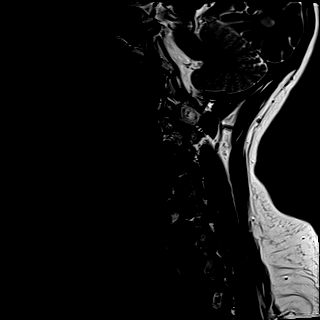
[im 8/15]
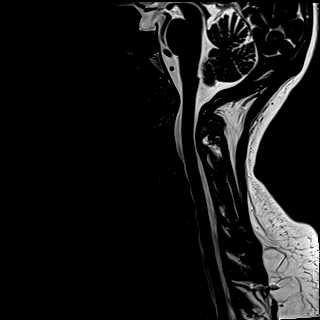
[im 11/15]
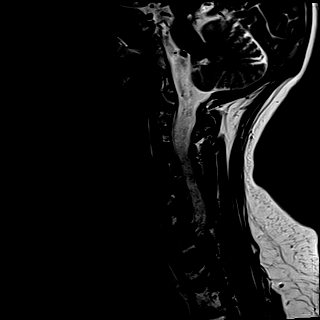
[im 15/15]
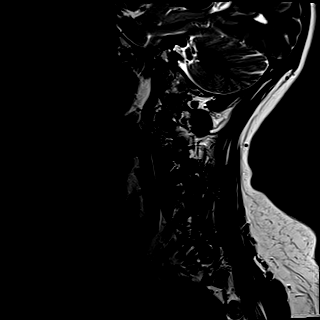

[Series 7: STIR · sagittal · 3.0mm · 0.86mm/px · 5 of 15 slices shown]
[im 1/15]
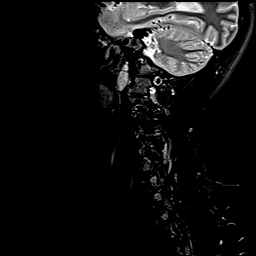
[im 4/15]
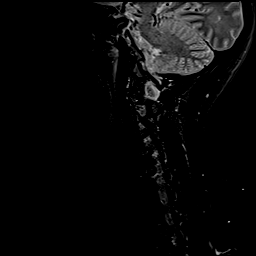
[im 8/15]
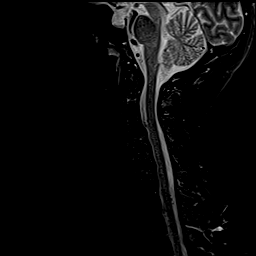
[im 11/15]
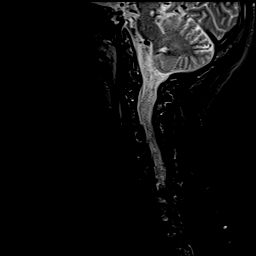
[im 15/15]
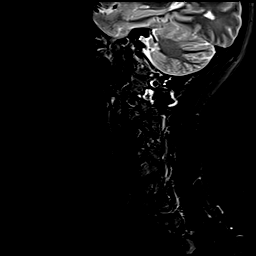

[Series 9: T2 · axial · 3.0mm · 0.70mm/px · z∈[-88,+25]mm · 9 of 34 slices shown (2 of 2)]
[im 1/34]
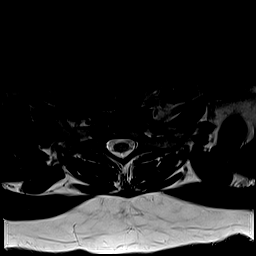
[im 7/34]
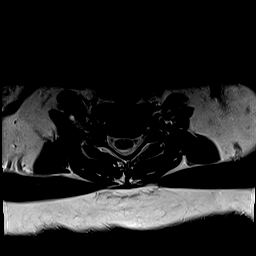
[im 10/34]
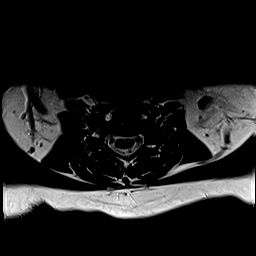
[im 16/34]
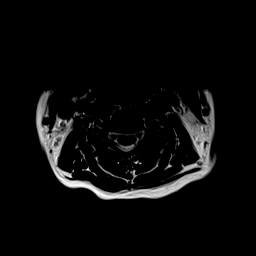
[im 19/34]
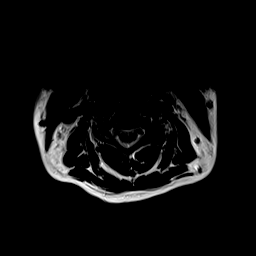
[im 25/34]
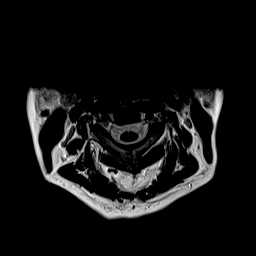
[im 28/34]
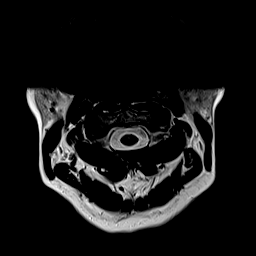
[im 31/34]
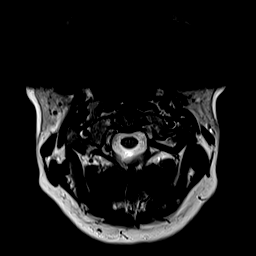
[im 34/34]
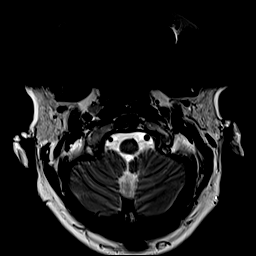

[Series 10: T1 · axial · 3.0mm · 0.35mm/px · z∈[-88,+25]mm · 9 of 34 slices shown (2 of 2)]
[im 1/34]
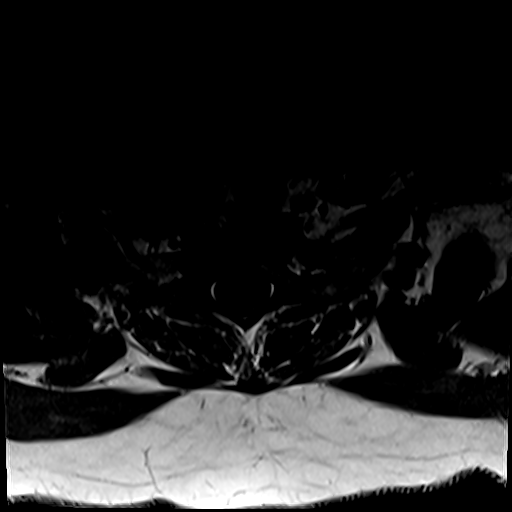
[im 7/34]
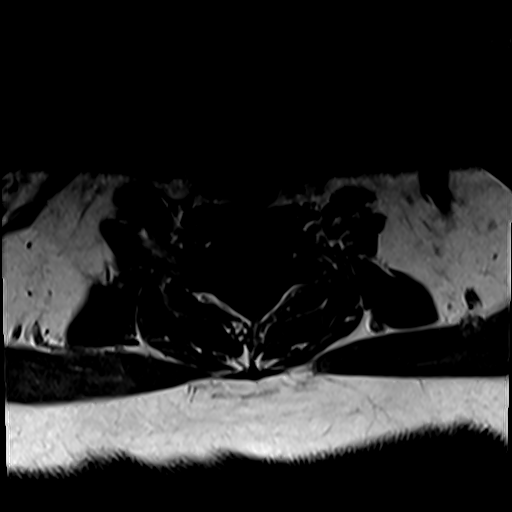
[im 10/34]
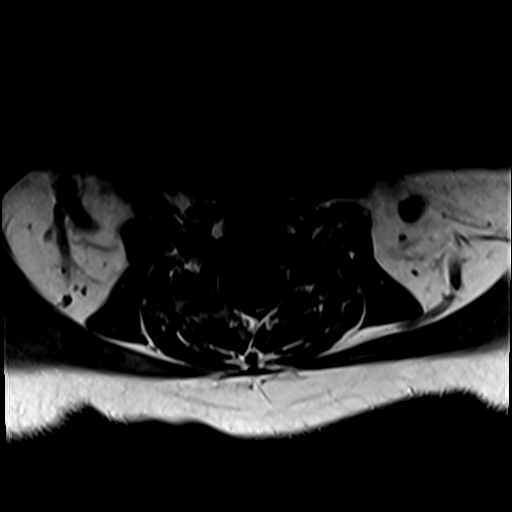
[im 16/34]
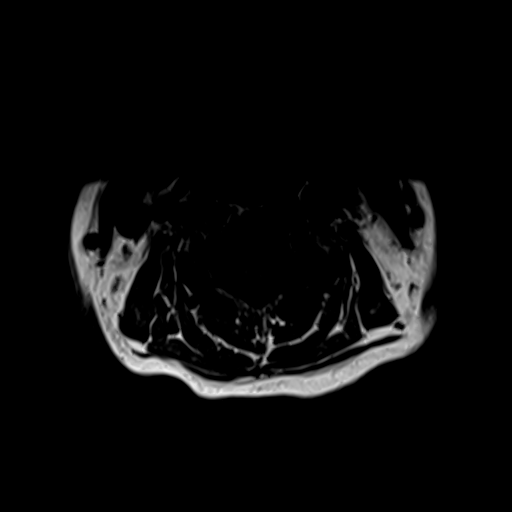
[im 19/34]
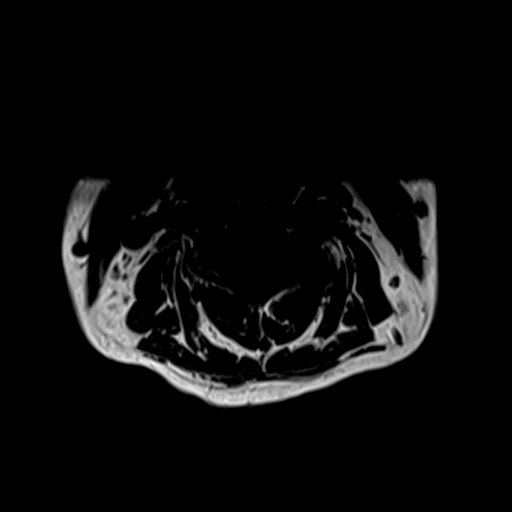
[im 25/34]
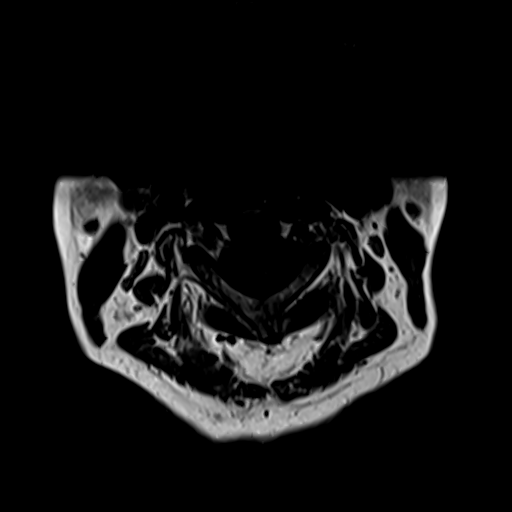
[im 28/34]
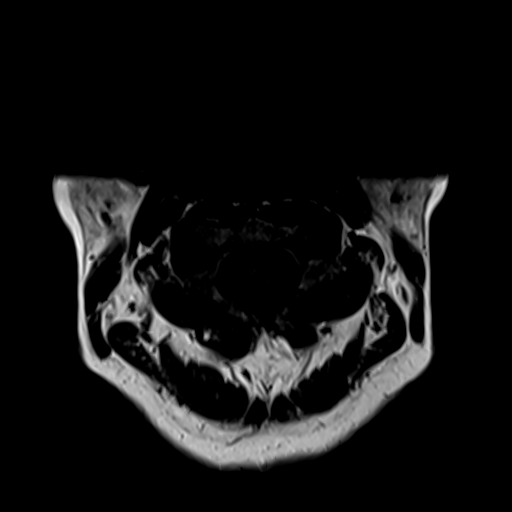
[im 31/34]
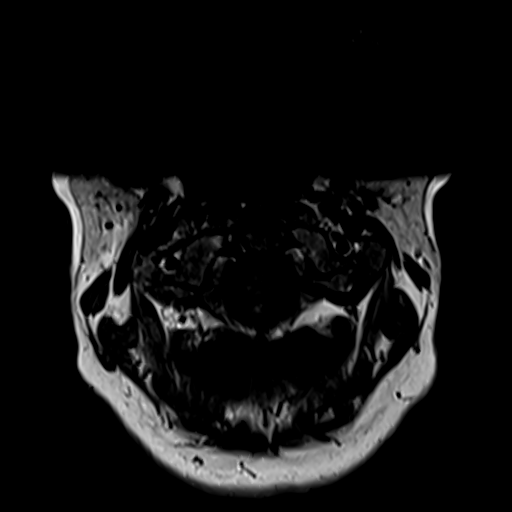
[im 34/34]
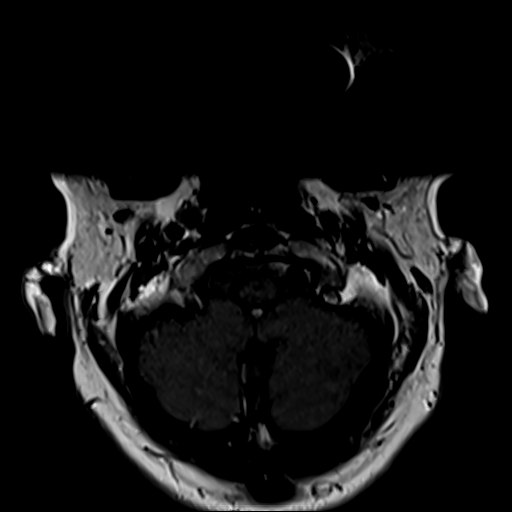

[33 of 48 positions shown; findings below may reference images not displayed]

FINDINGS: Alignment: Reversal of the expected cervical lordosis. 2 mm C3-C4
grade 1 anterolisthesis. Trace C4-C5 grade 1 anterolisthesis.

Vertebrae: Vertebral body height is maintained. No significant
marrow edema or focal suspicious osseous lesion. Ventral osteophytes
at C5-C6 and C6-C7.

Cord: No spinal cord signal abnormality is identified.

Posterior Fossa, vertebral arteries, paraspinal tissues: No
abnormality identified within included portions of the posterior
fossa. Flow voids preserved within the imaged cervical vertebral
arteries. Paraspinal soft tissues within normal limits. Incidentally
noted nonspecific enlarged right retropharyngeal lymph node
measuring 11 mm in short axis (series 9, image 1) (series 7, image
17).

Disc levels:

Moderate disc degeneration at C5-C6 and C6-C7. Mild disc
degeneration at the remaining levels.

C2-C3: No significant disc herniation or stenosis.

C3-C4: 2 mm grade 1 anterolisthesis. Disc uncovering. Mild
uncovertebral hypertrophy on the right. Facet arthrosis
(predominantly on the left). No significant spinal canal stenosis.
Mild relative right neural foraminal narrowing.

C4-C5: Trace grade 1 anterolisthesis. Slight disc uncovering. Facet
arthrosis (greater on the left). No significant spinal canal or
foraminal stenosis.

C5-C6: Posterior disc osteophyte complex with right greater than
left disc osteophyte ridge/uncinate hypertrophy. No significant
spinal canal stenosis. Moderate to moderately severe right neural
foraminal narrowing.

C6-C7: Posterior disc osteophyte complex with bilateral disc
osteophyte ridge/uncinate hypertrophy. No significant spinal canal
or foraminal stenosis.

C7-T1: Mild facet arthrosis on the left. No significant disc
herniation or spinal canal stenosis. Mild relative left neural
foraminal narrowing.

Impression #6 will be called to the ordering clinician or
representative by the Radiologist Assistant, and communication
documented in the PACS or [REDACTED].
IMPRESSION: Cervical spondylosis, as outlined and with findings most notably as
follows.

At C5-C6, there is moderate disc degeneration. Posterior disc
osteophyte complex with right greater than left disc osteophyte
ridge/uncinate hypertrophy. Moderate to moderately severe right
neural foraminal narrowing. Correlate for right C6 radiculopathy. No
significant spinal canal stenosis.

No significant spinal canal stenosis at the remaining levels.
Additional sites of mild foraminal narrowing, as detailed. Also of
note, disc degeneration is moderate in severity at C6-C7.

Reversal of the expected cervical lordosis.

Mild grade 1 anterolisthesis at C3-C4 and C4-C5.

Incidentally noted nonspecific enlarged right retropharyngeal lymph
node measuring 11 mm in short axis. Clinical correlation is
recommended. Additionally, consider a contrast-enhanced neck CT for
further evaluation.

## 2020-11-10 ENCOUNTER — Encounter: Payer: Self-pay | Admitting: Internal Medicine

## 2020-11-10 ENCOUNTER — Telehealth: Payer: Self-pay | Admitting: Orthopedic Surgery

## 2020-11-10 ENCOUNTER — Ambulatory Visit: Payer: 59 | Admitting: Orthopedic Surgery

## 2020-11-10 DIAGNOSIS — R591 Generalized enlarged lymph nodes: Secondary | ICD-10-CM

## 2020-11-10 DIAGNOSIS — M542 Cervicalgia: Secondary | ICD-10-CM

## 2020-11-10 NOTE — Telephone Encounter (Signed)
Pin with [facility] radiology called with a MRI spine report; she would like a CB to give the full report.   Pin [phone]

## 2020-11-11 NOTE — Telephone Encounter (Signed)
Called patient with results per her request.  Can we set her up for c-spine ESI with Dr. Alvester Morin? Additionally, patient said she is okay with ordering further CT scan for evaluation of lymphadenopathy (Contrast-enhanced neck CT scan).  She does have some general malaise new in the last couple days which sounds like a viral illness so I would defer to Dr. August Saucer if he still wants to order scan as that could explain her enlarged lymph node potentially. Denies any history of cancer

## 2020-11-16 ENCOUNTER — Other Ambulatory Visit: Payer: Self-pay

## 2020-11-16 ENCOUNTER — Ambulatory Visit (INDEPENDENT_AMBULATORY_CARE_PROVIDER_SITE_OTHER): Payer: 59 | Admitting: Internal Medicine

## 2020-11-16 ENCOUNTER — Encounter: Payer: Self-pay | Admitting: Internal Medicine

## 2020-11-16 VITALS — BP 112/74 | HR 86 | Ht 66.25 in | Wt 218.6 lb

## 2020-11-16 DIAGNOSIS — M1990 Unspecified osteoarthritis, unspecified site: Secondary | ICD-10-CM

## 2020-11-16 DIAGNOSIS — Z96652 Presence of left artificial knee joint: Secondary | ICD-10-CM | POA: Diagnosis not present

## 2020-11-16 DIAGNOSIS — M797 Fibromyalgia: Secondary | ICD-10-CM | POA: Diagnosis not present

## 2020-11-16 MED ORDER — DICLOFENAC SODIUM 25 MG PO TBEC: 25.0000 mg | DELAYED_RELEASE_TABLET | Freq: Two times a day (BID) | ORAL | 1 refills | Status: DC

## 2020-11-16 MED ORDER — SULFASALAZINE 500 MG PO TABS: 1000.0000 mg | ORAL_TABLET | Freq: Two times a day (BID) | ORAL | 1 refills | Status: DC

## 2020-11-16 NOTE — Progress Notes (Signed)
Office Visit Note  Patient: Kristi Decker             Date of Birth: 09/13/1975           MRN: [id number]             PCP: Girtha Rm, NP-C Referring: Meredith Pel, MD Visit Date: 11/16/2020   Subjective:  New Patient (Initial Visit) (Patient is on Sulfasalazine and Diclofenac prescribed by previous rheumatologist, she notices great improvement. Patient complains of neck pain with radiation to right upper extremity as well as low back pain. Patient had left TKA 08/17/2020.)   History of Present Illness: Kristi Decker is a 45 y.o. female here for chronic polyarticular inflammatory joint pain.  She was previously diagnosed with seronegative rheumatoid arthritis since about 2020 with previous treatment on oral diclofenac and sulfasalazine with apparently a significant response to treatment.  She also has chronic degenerative arthritis of multiple areas including the back for which she is undergone ESI and her left knee that underwent total knee arthroplasty with Dr. Marlou Sa earlier this year with a very great improvement in joint pain symptoms.  She discontinued her medication perioperatively and felt that her all over joint pains worsened very noticeably with treatment interruption.  She does also have history for suspected fibromyalgia contributing to generalized joint pains as well as associated symptoms of chronic fatigue and unrefreshing sleep, exertion intolerance worsening symptoms for a day longer afterwards, episodic all over pain that feels like having the flu, previous migraine headaches that have improved, and frequent feeling of cognitive impairment or difficulty.   Labs reviewed 01/2019 ANA neg RF neg CCP neg ESR 7 Uric acid 2.6 Lyme ELISA neg Interval HPI  Imaging reviewed (report reviewed only) 10/2020 MRI Cervical spine Multipl levels spondylolisthesis, mild foraminal stenosis, no significant canal stenosis, incidental 16m lymph node seen  04/2019 SI joints  x-ray did not show any evidence of chronic sacroiliitis, bilateral mild hip OA changes  Activities of Daily Living:  Patient reports morning stiffness for 24 hours.   Patient Reports nocturnal pain.  Difficulty dressing/grooming: Denies Difficulty climbing stairs: Denies Difficulty getting out of chair: Denies Difficulty using hands for taps, buttons, cutlery, and/or writing: Denies  Review of Systems  Constitutional:  Positive for fatigue.  HENT:  Negative for mouth sores, mouth dryness and nose dryness.   Eyes:  Negative for pain, itching, visual disturbance and dryness.  Respiratory:  Positive for cough and shortness of breath. Negative for hemoptysis and difficulty breathing.   Cardiovascular:  Negative for chest pain, palpitations and swelling in legs/feet.  Gastrointestinal:  Negative for abdominal pain, blood in stool, constipation and diarrhea.  Endocrine: Negative for increased urination.  Genitourinary:  Negative for painful urination.  Musculoskeletal:  Positive for joint pain, joint pain and morning stiffness. Negative for joint swelling, myalgias, muscle weakness, muscle tenderness and myalgias.  Skin:  Negative for color change, rash and redness.  Allergic/Immunologic: Negative for susceptible to infections.  Neurological:  Positive for dizziness, numbness, parasthesias and memory loss. Negative for headaches and weakness.  Hematological:  Positive for swollen glands.  Psychiatric/Behavioral:  Positive for sleep disturbance. Negative for confusion.    PMFS History:  Patient Active Problem List   Diagnosis Date Noted   Moderate persistent asthma without complication 032/44/0102  Arthritis of left knee    S/P total knee arthroplasty, left 08/17/2020   Inflammatory arthritis 10/10/2019   GAD (generalized anxiety disorder) 06/28/2018   Primary osteoarthritis of left knee  04/17/2018   Prediabetes 12/03/2017   Irritable bowel 03/19/2017   GERD without esophagitis  03/19/2017   Fibromyalgia 11/28/2016   Migraine without aura and without status migrainosus, not intractable 10/17/2016   Essential hypertension 10/17/2016    Past Medical History:  Diagnosis Date   Anemia    Anxiety    Arthritis    left knee   Asthma    COPD (chronic obstructive pulmonary disease) (HCC)    Depression    GERD (gastroesophageal reflux disease)    High cholesterol    Per patient   Hypertension    Neuromuscular disorder ([place])    right arm   PTSD (post-traumatic stress disorder)    Per patient    Family History  Problem Relation Age of Onset   Rheum arthritis Mother    Hypertension Mother    Depression Mother    Anxiety disorder Mother    Bipolar disorder Father    Mental illness Sister    Depression Sister    Congestive Heart Failure Maternal Grandfather    Past Surgical History:  Procedure Laterality Date   DILATION AND CURETTAGE OF UTERUS     2002   TOTAL KNEE ARTHROPLASTY Left 08/17/2020   Procedure: LEFT TOTAL KNEE ARTHROPLASTY;  Surgeon: Meredith Pel, MD;  Location: ;  Service: Orthopedics;  Laterality: Left;   UPPER GI ENDOSCOPY     WISDOM TOOTH EXTRACTION     Social History   Social History Narrative   Not on file   Immunization History  Administered Date(s) Administered   Influenza,inj,Quad PF,6+ Mos 02/24/2016, 02/08/2017, 02/22/2018, 02/14/2019, 02/04/2020   PFIZER(Purple Top)SARS-COV-2 Vaccination 08/24/2019, 09/14/2019   Pneumococcal Polysaccharide-23 02/04/2020   Tdap 11/28/2018     Objective: Vital Signs: BP 112/74 (BP Location: Right Arm, Patient Position: Sitting, Cuff Size: Normal)   Pulse 86   Ht 5' 6.25" (1.683 m)   Wt 218 lb 9.6 oz (99.2 kg)   LMP 05/16/2019   BMI 35.02 kg/m    Physical Exam Constitutional:      Appearance: She is obese.  HENT:     Mouth/Throat:     Mouth: Mucous membranes are moist.     Pharynx: Oropharynx is clear.  Cardiovascular:     Rate and Rhythm: Normal rate and regular  rhythm.  Pulmonary:     Effort: Pulmonary effort is normal.     Breath sounds: Normal breath sounds.  Skin:    General: Skin is warm and dry.     Findings: No rash.  Neurological:     General: No focal deficit present.     Mental Status: She is alert.     Deep Tendon Reflexes: Reflexes normal.  Psychiatric:        Mood and Affect: Mood normal.     Musculoskeletal Exam:  Neck lateral and posterior tenderness with range of motion bilaterally Shoulders full ROM tenderness more over trapezius and paraspinal muscles relative to the lateral shoulders Elbows full ROM no tenderness or swelling Wrists full ROM no tenderness or swelling Fingers full ROM no tenderness or swelling Knees full ROM no tenderness or swelling, well-healed anterior surgical scar over left knee Ankles full ROM no tenderness or swelling     Investigation: No additional findings.  Imaging: MR Cervical Spine w/o contrast  Result Date: 11/10/2020 CLINICAL DATA:  Neck pain, acute. Evaluate for source of right arm radicular pain. EXAM: MRI CERVICAL SPINE WITHOUT CONTRAST TECHNIQUE: Multiplanar, multisequence MR imaging of the cervical spine was performed. No  intravenous contrast was administered. COMPARISON:  Radiographs of the cervical spine 09/01/2020 (images available, report unavailable). FINDINGS: Alignment: Reversal of the expected cervical lordosis. 2 mm C3-C4 grade 1 anterolisthesis. Trace C4-C5 grade 1 anterolisthesis. Vertebrae: Vertebral body height is maintained. No significant marrow edema or focal suspicious osseous lesion. Ventral osteophytes at C5-C6 and C6-C7. Cord: No spinal cord signal abnormality is identified. Posterior Fossa, vertebral arteries, paraspinal tissues: No abnormality identified within included portions of the posterior fossa. Flow voids preserved within the imaged cervical vertebral arteries. Paraspinal soft tissues within normal limits. Incidentally noted nonspecific enlarged right  retropharyngeal lymph node measuring 11 mm in short axis (series 9, image 1) (series 7, image 17). Disc levels: Moderate disc degeneration at C5-C6 and C6-C7. Mild disc degeneration at the remaining levels. C2-C3: No significant disc herniation or stenosis. C3-C4: 2 mm grade 1 anterolisthesis. Disc uncovering. Mild uncovertebral hypertrophy on the right. Facet arthrosis (predominantly on the left). No significant spinal canal stenosis. Mild relative right neural foraminal narrowing. C4-C5: Trace grade 1 anterolisthesis. Slight disc uncovering. Facet arthrosis (greater on the left). No significant spinal canal or foraminal stenosis. C5-C6: Posterior disc osteophyte complex with right greater than left disc osteophyte ridge/uncinate hypertrophy. No significant spinal canal stenosis. Moderate to moderately severe right neural foraminal narrowing. C6-C7: Posterior disc osteophyte complex with bilateral disc osteophyte ridge/uncinate hypertrophy. No significant spinal canal or foraminal stenosis. C7-T1: Mild facet arthrosis on the left. No significant disc herniation or spinal canal stenosis. Mild relative left neural foraminal narrowing. Impression #6 will be called to the ordering clinician or representative by the Radiologist Assistant, and communication documented in the PACS or [employer]. IMPRESSION: Cervical spondylosis, as outlined and with findings most notably as follows. At C5-C6, there is moderate disc degeneration. Posterior disc osteophyte complex with right greater than left disc osteophyte ridge/uncinate hypertrophy. Moderate to moderately severe right neural foraminal narrowing. Correlate for right C6 radiculopathy. No significant spinal canal stenosis. No significant spinal canal stenosis at the remaining levels. Additional sites of mild foraminal narrowing, as detailed. Also of note, disc degeneration is moderate in severity at C6-C7. Reversal of the expected cervical lordosis. Mild grade 1  anterolisthesis at C3-C4 and C4-C5. Incidentally noted nonspecific enlarged right retropharyngeal lymph node measuring 11 mm in short axis. Clinical correlation is recommended. Additionally, consider a contrast-enhanced neck CT for further evaluation. Electronically Signed   By: Kellie Simmering DO   On: 11/10/2020 07:57   NCV with EMG (electromyography)  Result Date: 10/22/2020 Magnus Sinning, MD     10/27/2020  5:37 AM EMG & NCV Findings: Evaluation of the right median motor nerve showed prolonged distal onset latency (8.1 ms) and decreased conduction velocity (Elbow-Wrist, 45 m/s).  The right median (across palm) sensory nerve showed prolonged distal peak latency (Wrist, 9.3 ms), reduced amplitude (3.3 V), and prolonged distal peak latency (Palm, 2.9 ms).  All remaining nerves (as indicated in the following tables) were within normal limits.  All examined muscles (as indicated in the following table) showed no evidence of electrical instability.  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 There is no significant electrodiagnostic evidence of any other focal nerve entrapment, brachial plexopathy or cervical radiculopathy.  As you know, this particular electrodiagnostic study cannot rule out chemical radiculitis or sensory only radiculopathy. Recommendations: 1.  Follow-up with referring physician. 2.  Continue current management of symptoms. 3.  Suggest surgical evaluation. ___________________________ Laurence Spates FAAPMR Board Certified, American Board of Physical Medicine and Rehabilitation Nerve Conduction Studies Anti Sensory Summary Table  Stim Site NR Peak (ms) Norm Peak (ms) P-T Amp (V) Norm P-T Amp Site1 Site2 Delta-P (ms) Dist (cm) Vel (m/s) Norm Vel (m/s) Right Median Acr Palm Anti Sensory (2nd Digit)  30.9C Wrist    *9.3 <3.6 *3.3 >10 Wrist Palm  6.4 0.0   Palm    *2.9 <2.0 2.6        Right Radial Anti Sensory (Base 1st Digit)  30.9C Wrist    1.9 <3.1 35.5  Wrist Base 1st Digit 1.9 0.0   Right Ulnar Anti Sensory (5th Digit)  31.3C Wrist    2.9 <3.7 28.5 >15.0 Wrist 5th Digit 2.9 14.0 48 >38 Motor Summary Table  Stim Site NR Onset (ms) Norm Onset (ms) O-P Amp (mV) Norm O-P Amp Site1 Site2 Delta-0 (ms) Dist (cm) Vel (m/s) Norm Vel (m/s) Right Median Motor (Abd Poll Brev)  31C Wrist    *8.1 <4.2 5.0 >5 Elbow Wrist 4.6 20.5 *45 >50 Elbow    12.7  2.3        Right Ulnar Motor (Abd Dig Min)  30.8C Wrist    2.1 <4.2 8.2 >3 B Elbow Wrist 3.5 20.5 59 >53 B Elbow    5.6  8.7  A Elbow B Elbow 1.2 10.0 83 >53 A Elbow    6.8  8.5        EMG  Side Muscle Nerve Root Ins Act Fibs Psw Amp Dur Poly Recrt Int Fraser Din Comment Right 1stDorInt Ulnar C8-T1 Nml Nml Nml Nml Nml 0 Nml Nml  Right Abd Poll Brev Median C8-T1 Nml Nml Nml Nml Nml 0 Nml Nml  Right ExtDigCom   Nml Nml Nml Nml Nml 0 Nml Nml  Right Triceps Radial C6-7-8 Nml Nml Nml Nml Nml 0 Nml Nml  Right Deltoid Axillary C5-6 Nml Nml Nml Nml Nml 0 Nml Nml  Nerve Conduction Studies Anti Sensory Left/Right Comparison  Stim Site L Lat (ms) R Lat (ms) L-R Lat (ms) L Amp (V) R Amp (V) L-R Amp (%) Site1 Site2 L Vel (m/s) R Vel (m/s) L-R Vel (m/s) Median Acr Palm Anti Sensory (2nd Digit)  30.9C Wrist  *9.3   *3.3  Wrist Palm    Palm  *2.9   2.6       Radial Anti Sensory (Base 1st Digit)  30.9C Wrist  1.9   35.5  Wrist Base 1st Digit    Ulnar Anti Sensory (5th Digit)  31.3C Wrist  2.9   28.5  Wrist 5th Digit  48  Motor Left/Right Comparison  Stim Site L Lat (ms) R Lat (ms) L-R Lat (ms) L Amp (mV) R Amp (mV) L-R Amp (%) Site1 Site2 L Vel (m/s) R Vel (m/s) L-R Vel (m/s) Median Motor (Abd Poll Brev)  31C Wrist  *8.1   5.0  Elbow Wrist  *45  Elbow  12.7   2.3       Ulnar Motor (Abd Dig Min)  30.8C Wrist  2.1   8.2  B Elbow Wrist  59  B Elbow  5.6   8.7  A Elbow B Elbow  83  A Elbow  6.8   8.5       Waveforms:        MM 3D  SCREEN BREAST BILATERAL  Result Date: 10/29/2020 CLINICAL DATA:  Screening. EXAM: DIGITAL SCREENING BILATERAL MAMMOGRAM WITH TOMOSYNTHESIS AND CAD TECHNIQUE:  Bilateral screening digital craniocaudal and mediolateral oblique mammograms were obtained. Bilateral screening digital breast tomosynthesis was performed. The images were evaluated with computer-aided detection. COMPARISON:  Previous exam(s). ACR Breast Density Category c: The breast tissue is heterogeneously dense, which may obscure small masses. FINDINGS: There are no findings suspicious for malignancy. IMPRESSION: No mammographic evidence of malignancy. A result letter of this screening mammogram will be mailed directly to the patient. RECOMMENDATION: Screening mammogram in one year. (Code:SM-B-01Y) BI-RADS CATEGORY  1: Negative. Electronically Signed   By: Marin Olp M.D.   On: 10/29/2020 11:03    Recent Labs: Lab Results  Component Value Date   WBC 8.5 08/13/2020   HGB 13.7 08/13/2020   PLT 467 (H) 08/13/2020   NA 137 08/13/2020   K 3.4 (L) 08/13/2020   CL 101 08/13/2020   CO2 27 08/13/2020   GLUCOSE 106 (H) 08/13/2020   BUN 18 08/13/2020   CREATININE 0.64 08/13/2020   CALCIUM 10.3 08/13/2020    Speciality Comments: No specialty comments available.  Procedures:  No procedures performed Allergies: Patient has no known allergies.   Assessment / Plan:     Visit Diagnoses: Inflammatory arthritis - Plan: Sedimentation rate, C-reactive protein, Rheumatoid factor, Hepatic function panel, sulfaSALAzine (AZULFIDINE) 500 MG tablet, diclofenac (VOLTAREN) 25 MG EC tablet  History of inflammatory arthritis affecting multiple sites with appreciable symptom benefits to low-dose oral NSAIDs and immunosuppression.  We will plan to continue sulfasalazine 1000 mg twice daily and diclofenac 25 mg twice daily at this time.  Rechecking inflammatory serology including sedimentation rate, CRP, rheumatoid factor.  She has recent laboratory testing  for blood count and renal function are normal we will check hepatic function panel today.  If no particular inflammatory changes are seen in clinic follow-up and inflammatory serology is normal would recommend trial of decreasing or discontinuing sulfasalazine.  Fibromyalgia  History of fibromyalgia with generalized pain associated symptoms with fatigue migraines concentration difficulty she sees psychiatry for medication management of anxiety and depression including SNRI treatment with duloxetine.  S/P total knee arthroplasty, left  Left knee arthroplasty appears very well recovered good range of motion well-healed surgical scar and no swelling today.  Orders: Orders Placed This Encounter  Procedures   Sedimentation rate   C-reactive protein   Rheumatoid factor   Hepatic function panel    Meds ordered this encounter  Medications   sulfaSALAzine (AZULFIDINE) 500 MG tablet    Sig: Take 2 tablets (1,000 mg total) by mouth 2 (two) times daily.    Dispense:  120 tablet    Refill:  1   diclofenac (VOLTAREN) 25 MG EC tablet    Sig: Take 1 tablet (25 mg total) by mouth 2 (two) times daily.    Dispense:  30 tablet    Refill:  1     Follow-Up Instructions: Return in about 8 weeks (around 01/11/2021) for New pt f/u OA/FMS + ?inflammatory arthritis on NSAID and SSZ.   Collier Salina, MD  Note - This record has been created using [employer].  Chart creation errors have been sought, but may not always  have been located. Such creation errors do not reflect on  the standard of medical care.

## 2020-11-16 NOTE — Telephone Encounter (Signed)
Referral entered for cervical ESI with Dr. Alvester Morin. Awaiting response from Dr. August Saucer in regards to CT neck with contrast.

## 2020-11-17 LAB — RHEUMATOID FACTOR: Rheumatoid fact SerPl-aCnc: <14 IU/mL (ref <14)

## 2020-11-17 LAB — HEPATIC FUNCTION PANEL
AG Ratio: 1.5 (calc) (ref 1.0–2.5)
ALT: 19 U/L (ref 6–29)
AST: 18 U/L (ref 10–35)
Albumin: 4.5 g/dL (ref 3.6–5.1)
Alkaline phosphatase (APISO): 100 U/L (ref 31–125)
Bilirubin, Direct: 0 mg/dL (ref 0.0–0.2)
Globulin: 3 g/dL (calc) (ref 1.9–3.7)
Indirect Bilirubin: 0.2 mg/dL (calc) (ref 0.2–1.2)
Total Bilirubin: 0.2 mg/dL (ref 0.2–1.2)
Total Protein: 7.5 g/dL (ref 6.1–8.1)

## 2020-11-17 LAB — SEDIMENTATION RATE: Sed Rate: 9 mm/h (ref 0–20)

## 2020-11-17 LAB — C-REACTIVE PROTEIN: CRP: 3.3 mg/L (ref <8.0)

## 2020-11-17 NOTE — Telephone Encounter (Signed)
Please advise 

## 2020-11-17 NOTE — Telephone Encounter (Signed)
Please have Beather Arbour determine the need for CT scan of her neck for that enlarged lymph node.  I am not particularly certain that that is indicated or not.  Thanks she is her primary care provider nurse practitioner

## 2020-11-17 NOTE — Addendum Note (Signed)
Addended by: Ronnald Nian on: 11/17/2020 05:25 PM   Modules accepted: Orders

## 2020-11-18 ENCOUNTER — Other Ambulatory Visit: Payer: Self-pay | Admitting: Surgical

## 2020-11-18 ENCOUNTER — Telehealth: Payer: Self-pay | Admitting: Family Medicine

## 2020-11-18 NOTE — Telephone Encounter (Signed)
Left message for pt to call me back 

## 2020-11-18 NOTE — Telephone Encounter (Signed)
Sent our referral contact Caryn Hudson the info for her to check this out. KH

## 2020-11-18 NOTE — Telephone Encounter (Signed)
[facility] called they can not schedule patient, they are not in network with her insurance, [employer]

## 2020-11-18 NOTE — Telephone Encounter (Signed)
She doesn't need to continue this, at this point out from surgery

## 2020-11-18 NOTE — Telephone Encounter (Signed)
Pt was notified.  

## 2020-11-19 ENCOUNTER — Other Ambulatory Visit: Payer: Self-pay | Admitting: Family Medicine

## 2020-11-21 ENCOUNTER — Encounter: Payer: Self-pay | Admitting: Internal Medicine

## 2020-11-22 ENCOUNTER — Other Ambulatory Visit: Payer: Self-pay | Admitting: Radiology

## 2020-11-22 DIAGNOSIS — M797 Fibromyalgia: Secondary | ICD-10-CM

## 2020-11-22 DIAGNOSIS — M1990 Unspecified osteoarthritis, unspecified site: Secondary | ICD-10-CM

## 2020-11-22 MED ORDER — DICLOFENAC SODIUM 75 MG PO TBEC: 75.0000 mg | DELAYED_RELEASE_TABLET | Freq: Two times a day (BID) | ORAL | 2 refills | Status: DC

## 2020-11-22 NOTE — Telephone Encounter (Signed)
Received fax from [facility]- Rx was sent for Diclofenac (Voltaren) 25 mg tablet two times daily- per pharmacy 25 mg is unavailable and patient has been getting 75 mg. Pharmacy asked for a new Rx for 75 mg Diclofenac.

## 2020-11-22 NOTE — Telephone Encounter (Signed)
[facility] is requesting to fill pt diclofenac. Please advise KH 

## 2020-11-22 NOTE — Telephone Encounter (Signed)
Addressed in refill encounter, thanks.

## 2020-11-22 NOTE — Telephone Encounter (Signed)
Change in dosage was not intentional, I renewed the Rx in her medication list for some reason was as a 25 mg dose. New Rx sent now with refills. Her lab results were all negative for RA antibodies also for inflammatory markers. We can schedule for follow up in around 2-3 months to recheck symptoms and discuss changing medication at that time.

## 2020-11-22 NOTE — Telephone Encounter (Signed)
Spoke with patient, advised lab results were all negative for RA antibodies also for inflammatory markers. Patient has been scheduled for follow-up visit on 02/10/2021

## 2020-11-23 ENCOUNTER — Ambulatory Visit: Payer: 59 | Admitting: Internal Medicine

## 2020-11-26 ENCOUNTER — Encounter: Payer: 59 | Admitting: Family Medicine

## 2020-12-01 ENCOUNTER — Ambulatory Visit (INDEPENDENT_AMBULATORY_CARE_PROVIDER_SITE_OTHER): Payer: 59 | Admitting: Physical Medicine and Rehabilitation

## 2020-12-01 ENCOUNTER — Ambulatory Visit: Payer: 59 | Admitting: Orthopedic Surgery

## 2020-12-01 ENCOUNTER — Encounter: Payer: Self-pay | Admitting: Physical Medicine and Rehabilitation

## 2020-12-01 ENCOUNTER — Other Ambulatory Visit: Payer: Self-pay

## 2020-12-01 VITALS — BP 115/79 | HR 80

## 2020-12-01 DIAGNOSIS — G5601 Carpal tunnel syndrome, right upper limb: Secondary | ICD-10-CM | POA: Diagnosis not present

## 2020-12-01 DIAGNOSIS — M5412 Radiculopathy, cervical region: Secondary | ICD-10-CM

## 2020-12-01 DIAGNOSIS — M542 Cervicalgia: Secondary | ICD-10-CM | POA: Diagnosis not present

## 2020-12-01 DIAGNOSIS — M5010 Cervical disc disorder with radiculopathy, unspecified cervical region: Secondary | ICD-10-CM | POA: Diagnosis not present

## 2020-12-01 DIAGNOSIS — M797 Fibromyalgia: Secondary | ICD-10-CM

## 2020-12-01 NOTE — Progress Notes (Signed)
Neck pain for several months. Larey Seat a few days ago- put hands out to catch herself and immediately felt neck and back pain again. Right arm numbness; left arm numbness since fall. Numeric Pain Rating Scale and Functional Assessment Average Pain 6 Pain Right Now 8 My pain is constant, sharp, dull, tingling, and aching Pain is worse with:  sleeping on certain pillows Pain improves with: rest   In the last MONTH (on 0-10 scale) has pain interfered with the following?  1. General activity like being  able to carry out your everyday physical activities such as walking, climbing stairs, carrying groceries, or moving a chair?  Rating(5)  2. Relation with others like being able to carry out your usual social activities and roles such as  activities at home, at work and in your community. Rating(5)  3. Enjoyment of life such that you have  been bothered by emotional problems such as feeling anxious, depressed or irritable?  Rating(8)

## 2020-12-01 NOTE — Progress Notes (Addendum)
Kristi Decker - 45 y.o. female MRN [id number]  Date of birth: [birth year]  Office Visit Note: Visit Date: 12/01/2020 PCP: Avanell Shackleton, NP-C Referred by: Avanell Shackleton, NP-C  Subjective: Chief Complaint  Patient presents with   Neck - Pain   HPI: Kristi Decker is a 45 y.o. female who comes in today At the request of Dr. Burnard Bunting for evaluation management of chronic worsening severe neck pain with pain somewhat consistent with radiculitis but also with history of hand pain and numbness right more than left.  I have seen the patient at his request recently for electrodiagnostic study.  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  I am not sure at this point that he is followed up with her from that study.  In the interim she has completed cervical MRI.  This MRI is reviewed with her today with spine images and models.  She has failed all manner of conservative care with physical therapy and medication management and time.  She rates her pain on average is 6 out of 10 but it can go as high as 8 out of 10.  It is a sharp constant tingling and aching pain particularly in the hand and somewhat neck and arm.  She reports no specific focal weakness.  She has a history of chronic headache.  Her case is complicated by fibromyalgia.  She reports pain has been ongoing for several months now and recently she had a fall where she put her hands out to catch her self and has had more pain in the neck and back and numbness in the hand since that time.  She was having that prior to the fall however.  MRI of the cervical spine does show C5-6 disc osteophyte with right foraminal narrowing.  No high-grade central stenosis or high-grade nerve compression.  Review of Systems  Musculoskeletal:  Positive for back pain, falls and neck pain.  Neurological:  Positive for tingling.  All other systems reviewed and are negative. Otherwise per HPI.  Assessment & Plan: Visit Diagnoses:     ICD-10-CM   1. Cervicalgia  M54.2     2. Cervical radiculopathy  M54.12     3. 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 Carpal tunnel syndrome, right upper limb  G56.01     5. Fibromyalgia  M79.7        Plan: Findings:  Chronic, worsening and severe neck pain but also bilateral shoulder and arm pain right much more than left.  Historically she has had more right-sided symptoms and right hand pain and numbness.  She has had left arm numbness since a recent fall.  Exam is benign with no focal weakness no neurogenic issues.  She does have clinical signs and symptoms of carpal tunnel syndrome on the right with positive Phalen's as well as positive electrodiagnostic study with severe carpal tunnel syndrome on the right.  MRI of the cervical spine shows disc osteophyte on the right with foraminal narrowing.  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  She reports having to reschedule follow-up appointment with Dr. August Saucer from the nerve study.  At this point we will try to get preauthorization for diagnostic and hopefully therapeutic C7-T1 interlaminar injection.  This would help try to distinguish this between the carpal tunnel syndrome and the cervical spine.  She is going to need surgical decompression of her median nerve.  While her getting preapproval  we will try to get her a follow-up with Dr. August Saucer to review the nerve study.  Hopefully we can get the injection done before she sees him.   Meds & Orders: No orders of the defined types were placed in this encounter.  No orders of the defined types were placed in this encounter.   Follow-up: Return for Right C7-T1 interlaminar epidural steroid injection.   Procedures: No procedures performed      Clinical History: MRI CERVICAL SPINE WITHOUT CONTRAST   TECHNIQUE: Multiplanar, multisequence MR imaging of the cervical spine was performed. No intravenous contrast was administered.   COMPARISON:  Radiographs of the  cervical spine 09/01/2020 (images available, report unavailable).   FINDINGS: Alignment: Reversal of the expected cervical lordosis. 2 mm C3-C4 grade 1 anterolisthesis. Trace C4-C5 grade 1 anterolisthesis.   Vertebrae: Vertebral body height is maintained. No significant marrow edema or focal suspicious osseous lesion. Ventral osteophytes at C5-C6 and C6-C7.   Cord: No spinal cord signal abnormality is identified.   Posterior Fossa, vertebral arteries, paraspinal tissues: No abnormality identified within included portions of the posterior fossa. Flow voids preserved within the imaged cervical vertebral arteries. Paraspinal soft tissues within normal limits. Incidentally noted nonspecific enlarged right retropharyngeal lymph node measuring 11 mm in short axis (series 9, image 1) (series 7, image 17).   Disc levels:   Moderate disc degeneration at C5-C6 and C6-C7. Mild disc degeneration at the remaining levels.   C2-C3: No significant disc herniation or stenosis.   C3-C4: 2 mm grade 1 anterolisthesis. Disc uncovering. Mild uncovertebral hypertrophy on the right. Facet arthrosis (predominantly on the left). No significant spinal canal stenosis. Mild relative right neural foraminal narrowing.   C4-C5: Trace grade 1 anterolisthesis. Slight disc uncovering. Facet arthrosis (greater on the left). No significant spinal canal or foraminal stenosis.   C5-C6: Posterior disc osteophyte complex with right greater than left disc osteophyte ridge/uncinate hypertrophy. No significant spinal canal stenosis. Moderate to moderately severe right neural foraminal narrowing.   C6-C7: Posterior disc osteophyte complex with bilateral disc osteophyte ridge/uncinate hypertrophy. No significant spinal canal or foraminal stenosis.   C7-T1: Mild facet arthrosis on the left. No significant disc herniation or spinal canal stenosis. Mild relative left neural foraminal narrowing.   Impression #6 will  be called to the ordering clinician or representative by the Radiologist Assistant, and communication documented in the PACS or [employer].   IMPRESSION: Cervical spondylosis, as outlined and with findings most notably as follows.   At C5-C6, there is moderate disc degeneration. Posterior disc osteophyte complex with right greater than left disc osteophyte ridge/uncinate hypertrophy. Moderate to moderately severe right neural foraminal narrowing. Correlate for right C6 radiculopathy. No significant spinal canal stenosis.   No significant spinal canal stenosis at the remaining levels. Additional sites of mild foraminal narrowing, as detailed. Also of note, disc degeneration is moderate in severity at C6-C7.   Reversal of the expected cervical lordosis.   Mild grade 1 anterolisthesis at C3-C4 and C4-C5.   Incidentally noted nonspecific enlarged right retropharyngeal lymph node measuring 11 mm in short axis. Clinical correlation is recommended. Additionally, consider a contrast-enhanced neck CT for further evaluation.     Electronically Signed   By: Jackey Loge DO   On: 11/10/2020 07:57   She reports that she has never smoked. She has never used smokeless tobacco. No results for input(s): HGBA1C, LABURIC in the last 8760 hours.  Objective:  VS:  HT:    WT:   BMI:  BP:115/79  HR:80bpm  TEMP: ( )  RESP:  Physical Exam Vitals and nursing note reviewed.  Constitutional:      General: She is not in acute distress.    Appearance: Normal appearance. She is not ill-appearing.  HENT:     Head: Normocephalic and atraumatic.     Right Ear: External ear normal.     Left Ear: External ear normal.  Eyes:     Extraocular Movements: Extraocular movements intact.  Cardiovascular:     Rate and Rhythm: Normal rate.     Pulses: Normal pulses.  Musculoskeletal:     Cervical back: Tenderness present. No rigidity.     Right lower leg: No edema.     Left lower leg: No edema.      Comments: Patient has good strength in the upper extremities including 5 out of 5 strength in wrist extension long finger flexion and APB.  There is no atrophy of the hands intrinsically.  There is a negative Hoffmann's test.  Equivocally positive Spurling's to the right.  Does not cause radicular pain.  She does have multiple tender points across the neck and trapezius and arms.  She does have some trigger points that are not that active.  She has a positive Phalen's on the right.   Lymphadenopathy:     Cervical: No cervical adenopathy.  Skin:    Findings: No erythema, lesion or rash.  Neurological:     General: No focal deficit present.     Mental Status: She is alert and oriented to person, place, and time.     Sensory: No sensory deficit.     Motor: No weakness or abnormal muscle tone.     Coordination: Coordination normal.  Psychiatric:        Mood and Affect: Mood normal.        Behavior: Behavior normal.    Ortho Exam  Imaging: No results found.  Past Medical/Family/Surgical/Social History: Medications & Allergies reviewed per EMR, new medications updated. Patient Active Problem List   Diagnosis Date Noted   Moderate persistent asthma without complication 09/17/2020   Arthritis of left knee    S/P total knee arthroplasty, left 08/17/2020   Inflammatory arthritis 10/10/2019   GAD (generalized anxiety disorder) 06/28/2018   Primary osteoarthritis of left knee 04/17/2018   Prediabetes 12/03/2017   Irritable bowel 03/19/2017   GERD without esophagitis 03/19/2017   Fibromyalgia 11/28/2016   Migraine without aura and without status migrainosus, not intractable 10/17/2016   Essential hypertension 10/17/2016   Past Medical History:  Diagnosis Date   Anemia    Anxiety    Arthritis    left knee   Asthma    COPD (chronic obstructive pulmonary disease) (HCC)    Depression    GERD (gastroesophageal reflux disease)    High cholesterol    Per patient   Hypertension     Neuromuscular disorder (HCC)    right arm   PTSD (post-traumatic stress disorder)    Per patient   Family History  Problem Relation Age of Onset   Rheum arthritis Mother    Hypertension Mother    Depression Mother    Anxiety disorder Mother    Bipolar disorder Father    Mental illness Sister    Depression Sister    Congestive Heart Failure Maternal Grandfather    Past Surgical History:  Procedure Laterality Date   DILATION AND CURETTAGE OF UTERUS     2002   TOTAL KNEE ARTHROPLASTY Left 08/17/2020  Procedure: LEFT TOTAL KNEE ARTHROPLASTY;  Surgeon: Cammy Copaean, Gregory Scott, MD;  Location: [facility] OR;  Service: Orthopedics;  Laterality: Left;   UPPER GI ENDOSCOPY     WISDOM TOOTH EXTRACTION     Social History   Occupational History   Not on file  Tobacco Use   Smoking status: Never   Smokeless tobacco: Never  Vaping Use   Vaping Use: Never used  Substance and Sexual Activity   Alcohol use: Yes    Comment: Occasionally   Drug use: Never   Sexual activity: Not on file

## 2020-12-02 ENCOUNTER — Encounter: Payer: Self-pay | Admitting: Physical Medicine and Rehabilitation

## 2020-12-02 ENCOUNTER — Telehealth: Payer: Self-pay | Admitting: Family Medicine

## 2020-12-02 MED ORDER — LISINOPRIL-HYDROCHLOROTHIAZIDE 20-12.5 MG PO TABS: 1.0000 | ORAL_TABLET | Freq: Every day | ORAL | 1 refills | Status: DC

## 2020-12-02 NOTE — Telephone Encounter (Signed)
Refilled medication

## 2020-12-02 NOTE — Telephone Encounter (Signed)
[facility] sent refill request for lisinopril hctz 20-12.5 please send to the [facility] [id number] - [place], [place] - [address]

## 2020-12-10 ENCOUNTER — Encounter: Payer: Self-pay | Admitting: Physical Medicine and Rehabilitation

## 2020-12-13 ENCOUNTER — Other Ambulatory Visit: Payer: Self-pay

## 2020-12-13 ENCOUNTER — Ambulatory Visit (INDEPENDENT_AMBULATORY_CARE_PROVIDER_SITE_OTHER): Payer: 59 | Admitting: Orthopedic Surgery

## 2020-12-13 ENCOUNTER — Telehealth: Payer: Self-pay | Admitting: Orthopedic Surgery

## 2020-12-13 DIAGNOSIS — Z96652 Presence of left artificial knee joint: Secondary | ICD-10-CM | POA: Diagnosis not present

## 2020-12-13 NOTE — Telephone Encounter (Signed)
This was sent to me in error.  I have never seen the patient.

## 2020-12-13 NOTE — Telephone Encounter (Signed)
Patient saw Dr. Alvester Morin for an OV on 7/20. He wanted to try the cervical ESI before she saw Dr. August Saucer again, but we did not receive authorization until Friday, 7/29. Patient is scheduled to see Dr. August Saucer this afternoon.

## 2020-12-13 NOTE — Telephone Encounter (Signed)
We disucssed this at her appointment

## 2020-12-17 ENCOUNTER — Encounter: Payer: Self-pay | Admitting: Orthopedic Surgery

## 2020-12-17 NOTE — Progress Notes (Signed)
Office Visit Note   Patient: Kristi Decker           Date of Birth: [birth year]           MRN: [id number] Visit Date: 12/13/2020 Requested by: Avanell Shackleton, NP-C [address] [place],  [place] [address] PCP: Avanell Shackleton, NP-C  Subjective: Chief Complaint  Patient presents with   Left Knee - Routine Post Op    HPI: Kristi Decker is a 45 y.o. female who presents to the office complaining of continued radicular pain.  Patient has continued neck pain that was previously bothering just her right arm and now is both arms but the right arm is still worse than her left.  She has continued neck pain.  She is also s/p left total knee arthroplasty on 08/17/2020.  Her knee is doing well with 0 problems.  She does not have to take any pain medication or any medication in general for her knee pain.  She has finished physical therapy.  She denies any difficulty ascending/descending stairs or standing up from sitting on the toilet.  She has returned back to work..                ROS: All systems reviewed are negative as they relate to the chief complaint within the history of present illness.  Patient denies fevers or chills.  Assessment & Plan: Visit Diagnoses:  1. S/P total knee arthroplasty, left     Plan: Patient is a 45 year old female who returns for final check following left total knee arthroplasty on 08/17/2020.  She is doing very well and her knee has virtually no pain compared to prior to surgery.  She is not taking any pain medication.  She has excellent function of the knee and has returned to work.  No difficulty with stairs or getting up from toilet.  Plan to continue with therapy exercises for the knee a couple times a week.  She is also scheduled for cervical spine ESI for the radicular pain she is experiencing.  Plan for her to follow-up as needed if she does not have any lasting relief from [facility] or if she starts to notice more difficulties with her knee.  Follow-Up  Instructions: No follow-ups on file.   Orders:  No orders of the defined types were placed in this encounter.  No orders of the defined types were placed in this encounter.     Procedures: No procedures performed   Clinical Data: No additional findings.  Objective: Vital Signs: LMP 05/16/2019   Physical Exam:  Constitutional: Patient appears well-developed HEENT:  Head: Normocephalic Eyes:EOM are normal Neck: Normal range of motion Cardiovascular: Normal rate Pulmonary/chest: Effort normal Neurologic: Patient is alert Skin: Skin is warm Psychiatric: Patient has normal mood and affect  Ortho Exam: Ortho exam demonstrates left knee with 0 degrees extension and 120 degrees of knee flexion.  She has well-healed incision with no calf tenderness.  Excellent quadricep strength rated 5/5.  Able to perform multiple straight leg raises without extensor lag.  Specialty Comments:  No specialty comments available.  Imaging: No results found.   PMFS History: Patient Active Problem List   Diagnosis Date Noted   Moderate persistent asthma without complication 09/17/2020   Arthritis of left knee    S/P total knee arthroplasty, left 08/17/2020   Inflammatory arthritis 10/10/2019   GAD (generalized anxiety disorder) 06/28/2018   Primary osteoarthritis of left knee 04/17/2018   Prediabetes 12/03/2017   Irritable bowel 03/19/2017  GERD without esophagitis 03/19/2017   Fibromyalgia 11/28/2016   Migraine without aura and without status migrainosus, not intractable 10/17/2016   Essential hypertension 10/17/2016   Past Medical History:  Diagnosis Date   Anemia    Anxiety    Arthritis    left knee   Asthma    COPD (chronic obstructive pulmonary disease) (HCC)    Depression    GERD (gastroesophageal reflux disease)    High cholesterol    Per patient   Hypertension    Neuromuscular disorder (HCC)    right arm   PTSD (post-traumatic stress disorder)    Per patient     Family History  Problem Relation Age of Onset   Rheum arthritis Mother    Hypertension Mother    Depression Mother    Anxiety disorder Mother    Bipolar disorder Father    Mental illness Sister    Depression Sister    Congestive Heart Failure Maternal Grandfather     Past Surgical History:  Procedure Laterality Date   DILATION AND CURETTAGE OF UTERUS     2002   TOTAL KNEE ARTHROPLASTY Left 08/17/2020   Procedure: LEFT TOTAL KNEE ARTHROPLASTY;  Surgeon: Cammy Copa, MD;  Location: [facility] OR;  Service: Orthopedics;  Laterality: Left;   UPPER GI ENDOSCOPY     WISDOM TOOTH EXTRACTION     Social History   Occupational History   Not on file  Tobacco Use   Smoking status: Never   Smokeless tobacco: Never  Vaping Use   Vaping Use: Never used  Substance and Sexual Activity   Alcohol use: Yes    Comment: Occasionally   Drug use: Never   Sexual activity: Not on file

## 2020-12-20 ENCOUNTER — Other Ambulatory Visit: Payer: Self-pay | Admitting: Family Medicine

## 2020-12-21 ENCOUNTER — Ambulatory Visit (HOSPITAL_COMMUNITY): Payer: 59

## 2020-12-23 ENCOUNTER — Ambulatory Visit (INDEPENDENT_AMBULATORY_CARE_PROVIDER_SITE_OTHER): Payer: 59 | Admitting: Physical Medicine and Rehabilitation

## 2020-12-23 ENCOUNTER — Other Ambulatory Visit: Payer: Self-pay

## 2020-12-23 ENCOUNTER — Ambulatory Visit: Payer: Self-pay

## 2020-12-23 ENCOUNTER — Encounter: Payer: Self-pay | Admitting: Physical Medicine and Rehabilitation

## 2020-12-23 VITALS — BP 128/84 | HR 75

## 2020-12-23 DIAGNOSIS — M5412 Radiculopathy, cervical region: Secondary | ICD-10-CM

## 2020-12-23 MED ORDER — BETAMETHASONE SOD PHOS & ACET 6 (3-3) MG/ML IJ SUSP
12.0000 mg | Freq: Once | INTRAMUSCULAR | Status: AC
Start: 2020-12-23 — End: 2020-12-23
  Administered 2020-12-23: 12 mg

## 2020-12-23 NOTE — Progress Notes (Signed)
Pt state neck pain that travels down both arms. Pt state when she wakes up her arms feel numb. Pt state Pt state when driving she can not turn her head with out pain. Pt state she takes over the counter pain meds and uses heating to help ease her pain.  Numeric Pain Rating Scale and Functional Assessment Average Pain 8   In the last MONTH (on 0-10 scale) has pain interfered with the following?  1. General activity like being  able to carry out your everyday physical activities such as walking, climbing stairs, carrying groceries, or moving a chair?  Rating(10)   +Driver, -BT, -Dye Allergies.

## 2020-12-23 NOTE — Progress Notes (Signed)
Kristi Decker - 45 y.o. female MRN [id number]  Date of birth: 1976-04-04  Office Visit Note: Visit Date: 12/23/2020 PCP: Avanell Shackleton, NP-C Referred by: Avanell Shackleton, NP-C  Subjective: Chief Complaint  Patient presents with   Neck - Pain   Left Arm - Numbness, Pain   Right Arm - Pain, Numbness   HPI:  Kristi Decker is a 45 y.o. female who comes in today for planned Right C7-T1 Cervical Interlaminar epidural steroid injection with fluoroscopic guidance.  The patient has failed conservative care including home exercise, medications, time and activity modification.  This injection will be diagnostic and hopefully therapeutic.  Please see requesting physician notes for further details and justification.   ROS Otherwise per HPI.  Assessment & Plan: Visit Diagnoses:    ICD-10-CM   1. Cervical radiculopathy  M54.12 XR C-ARM NO REPORT    Epidural Steroid injection    betamethasone acetate-betamethasone sodium phosphate (CELESTONE) injection 12 mg      Plan: No additional findings.   Meds & Orders:  Meds ordered this encounter  Medications   betamethasone acetate-betamethasone sodium phosphate (CELESTONE) injection 12 mg    Orders Placed This Encounter  Procedures   XR C-ARM NO REPORT   Epidural Steroid injection    Follow-up: Return if symptoms worsen or fail to improve.   Procedures: No procedures performed  Cervical Epidural Steroid Injection - Interlaminar Approach with Fluoroscopic Guidance  Patient: Kristi Decker      Date of Birth: 10/22/1975 MRN: [id number] PCP: Avanell Shackleton, NP-C      Visit Date: 12/23/2020   Universal Protocol:    Date/Time: 08/11/221:45 PM  Consent Given By: the patient  Position: PRONE  Additional Comments: Vital signs were monitored before and after the procedure. Patient was prepped and draped in the usual sterile fashion. The correct patient, procedure, and site was verified.   Injection Procedure Details:    Procedure diagnoses: Cervical radiculopathy [M54.12]    Meds Administered:  Meds ordered this encounter  Medications   betamethasone acetate-betamethasone sodium phosphate (CELESTONE) injection 12 mg     Laterality: Right  Location/Site: C7-T1  Needle: 3.5 in., 20 ga. Tuohy  Needle Placement: Paramedian epidural space  Findings:  -Comments: Excellent flow of contrast into the epidural space. Patient had mild vasovagal response after injection completion  Procedure Details: Using a paramedian approach from the side mentioned above, the region overlying the inferior lamina was localized under fluoroscopic visualization and the soft tissues overlying this structure were infiltrated with 4 ml. of 1% Lidocaine without Epinephrine. A # 20 gauge, Tuohy needle was inserted into the epidural space using a paramedian approach.  The epidural space was localized using loss of resistance along with contralateral oblique bi-planar fluoroscopic views.  After negative aspirate for air, blood, and CSF, a 2 ml. volume of Isovue-250 was injected into the epidural space and the flow of contrast was observed. Radiographs were obtained for documentation purposes.   The injectate was administered into the level noted above.  Additional Comments:  The patient tolerated the procedure well Dressing: 2 x 2 sterile gauze and Band-Aid    Post-procedure details: Patient was observed during the procedure. Post-procedure instructions were reviewed.  Patient left the clinic in stable condition.   Clinical History: MRI CERVICAL SPINE WITHOUT CONTRAST   TECHNIQUE: Multiplanar, multisequence MR imaging of the cervical spine was performed. No intravenous contrast was administered.   COMPARISON:  Radiographs of the cervical spine 09/01/2020 (images available, report unavailable).  FINDINGS: Alignment: Reversal of the expected cervical lordosis. 2 mm C3-C4 grade 1 anterolisthesis. Trace C4-C5 grade 1  anterolisthesis.   Vertebrae: Vertebral body height is maintained. No significant marrow edema or focal suspicious osseous lesion. Ventral osteophytes at C5-C6 and C6-C7.   Cord: No spinal cord signal abnormality is identified.   Posterior Fossa, vertebral arteries, paraspinal tissues: No abnormality identified within included portions of the posterior fossa. Flow voids preserved within the imaged cervical vertebral arteries. Paraspinal soft tissues within normal limits. Incidentally noted nonspecific enlarged right retropharyngeal lymph node measuring 11 mm in short axis (series 9, image 1) (series 7, image 17).   Disc levels:   Moderate disc degeneration at C5-C6 and C6-C7. Mild disc degeneration at the remaining levels.   C2-C3: No significant disc herniation or stenosis.   C3-C4: 2 mm grade 1 anterolisthesis. Disc uncovering. Mild uncovertebral hypertrophy on the right. Facet arthrosis (predominantly on the left). No significant spinal canal stenosis. Mild relative right neural foraminal narrowing.   C4-C5: Trace grade 1 anterolisthesis. Slight disc uncovering. Facet arthrosis (greater on the left). No significant spinal canal or foraminal stenosis.   C5-C6: Posterior disc osteophyte complex with right greater than left disc osteophyte ridge/uncinate hypertrophy. No significant spinal canal stenosis. Moderate to moderately severe right neural foraminal narrowing.   C6-C7: Posterior disc osteophyte complex with bilateral disc osteophyte ridge/uncinate hypertrophy. No significant spinal canal or foraminal stenosis.   C7-T1: Mild facet arthrosis on the left. No significant disc herniation or spinal canal stenosis. Mild relative left neural foraminal narrowing.   Impression #6 will be called to the ordering clinician or representative by the Radiologist Assistant, and communication documented in the PACS or [employer].   IMPRESSION: Cervical spondylosis, as  outlined and with findings most notably as follows.   At C5-C6, there is moderate disc degeneration. Posterior disc osteophyte complex with right greater than left disc osteophyte ridge/uncinate hypertrophy. Moderate to moderately severe right neural foraminal narrowing. Correlate for right C6 radiculopathy. No significant spinal canal stenosis.   No significant spinal canal stenosis at the remaining levels. Additional sites of mild foraminal narrowing, as detailed. Also of note, disc degeneration is moderate in severity at C6-C7.   Reversal of the expected cervical lordosis.   Mild grade 1 anterolisthesis at C3-C4 and C4-C5.   Incidentally noted nonspecific enlarged right retropharyngeal lymph node measuring 11 mm in short axis. Clinical correlation is recommended. Additionally, consider a contrast-enhanced neck CT for further evaluation.     Electronically Signed   By: Jackey Loge DO   On: 11/10/2020 07:57     Objective:  VS:  HT:    WT:   BMI:     BP:128/84  HR:75bpm  TEMP: ( )  RESP:  Physical Exam Vitals and nursing note reviewed.  Constitutional:      General: She is not in acute distress.    Appearance: Normal appearance. She is not ill-appearing.  HENT:     Head: Normocephalic and atraumatic.     Right Ear: External ear normal.     Left Ear: External ear normal.  Eyes:     Extraocular Movements: Extraocular movements intact.  Cardiovascular:     Rate and Rhythm: Normal rate.     Pulses: Normal pulses.  Musculoskeletal:     Cervical back: Tenderness present. No rigidity.     Right lower leg: No edema.     Left lower leg: No edema.     Comments: Patient has good strength in the  upper extremities including 5 out of 5 strength in wrist extension long finger flexion and APB.  There is no atrophy of the hands intrinsically.  There is a negative Hoffmann's test.   Lymphadenopathy:     Cervical: No cervical adenopathy.  Skin:    Findings: No erythema,  lesion or rash.  Neurological:     General: No focal deficit present.     Mental Status: She is alert and oriented to person, place, and time.     Sensory: No sensory deficit.     Motor: No weakness or abnormal muscle tone.     Coordination: Coordination normal.  Psychiatric:        Mood and Affect: Mood normal.        Behavior: Behavior normal.     Imaging: XR C-ARM NO REPORT  Result Date: 12/23/2020 Please see Notes tab for imaging impression.

## 2020-12-23 NOTE — Patient Instructions (Signed)

## 2020-12-23 NOTE — Procedures (Signed)
Cervical Epidural Steroid Injection - Interlaminar Approach with Fluoroscopic Guidance  Patient: Kristi Decker      Date of Birth: [birth year] MRN: [id number] PCP: Avanell Shackleton, NP-C      Visit Date: 12/23/2020   Universal Protocol:    Date/Time: 08/11/221:45 PM  Consent Given By: the patient  Position: PRONE  Additional Comments: Vital signs were monitored before and after the procedure. Patient was prepped and draped in the usual sterile fashion. The correct patient, procedure, and site was verified.   Injection Procedure Details:   Procedure diagnoses: Cervical radiculopathy [M54.12]    Meds Administered:  Meds ordered this encounter  Medications   betamethasone acetate-betamethasone sodium phosphate (CELESTONE) injection 12 mg     Laterality: Right  Location/Site: C7-T1  Needle: 3.5 in., 20 ga. Tuohy  Needle Placement: Paramedian epidural space  Findings:  -Comments: Excellent flow of contrast into the epidural space. Patient had mild vasovagal response after injection completion  Procedure Details: Using a paramedian approach from the side mentioned above, the region overlying the inferior lamina was localized under fluoroscopic visualization and the soft tissues overlying this structure were infiltrated with 4 ml. of 1% Lidocaine without Epinephrine. A # 20 gauge, Tuohy needle was inserted into the epidural space using a paramedian approach.  The epidural space was localized using loss of resistance along with contralateral oblique bi-planar fluoroscopic views.  After negative aspirate for air, blood, and CSF, a 2 ml. volume of Isovue-250 was injected into the epidural space and the flow of contrast was observed. Radiographs were obtained for documentation purposes.   The injectate was administered into the level noted above.  Additional Comments:  The patient tolerated the procedure well Dressing: 2 x 2 sterile gauze and Band-Aid    Post-procedure  details: Patient was observed during the procedure. Post-procedure instructions were reviewed.  Patient left the clinic in stable condition.

## 2020-12-30 ENCOUNTER — Encounter (HOSPITAL_BASED_OUTPATIENT_CLINIC_OR_DEPARTMENT_OTHER): Payer: Self-pay

## 2020-12-30 ENCOUNTER — Ambulatory Visit (HOSPITAL_BASED_OUTPATIENT_CLINIC_OR_DEPARTMENT_OTHER)
Admission: RE | Admit: 2020-12-30 | Discharge: 2020-12-30 | Disposition: A | Discharge status: Home / Self Care | Payer: 59 | Source: Ambulatory Visit | Attending: Family Medicine | Admitting: Family Medicine

## 2020-12-30 ENCOUNTER — Other Ambulatory Visit: Payer: Self-pay

## 2020-12-30 DIAGNOSIS — R591 Generalized enlarged lymph nodes: Secondary | ICD-10-CM | POA: Diagnosis present

## 2020-12-30 LAB — POCT I-STAT CREATININE: Creatinine, Ser: 0.5 mg/dL (ref 0.44–1.00)

## 2020-12-30 IMAGING — CT CT NECK W/ CM
4 of 5 series · 14 of 33 positions shown, 16 images · IV contrast (omnipaque)
Comparison: Cervical spine MRI [DATE]

CLINICAL DATA: Lymphadenopathy seen on MRI of the cervical spine

EXAM:
CT NECK WITH CONTRAST
TECHNIQUE: Multidetector CT imaging of the neck was performed using the
standard protocol following the bolus administration of intravenous
contrast.
CONTRAST:  60mL OMNIPAQUE IOHEXOL 350 MG/ML SOLN

[Series 2: axial neck (person_name) · axial · 0.57mm/px · z∈[-203,-87]mm · 3 of 117 slices shown, 4 images]
[im 30/117  soft-tissue]
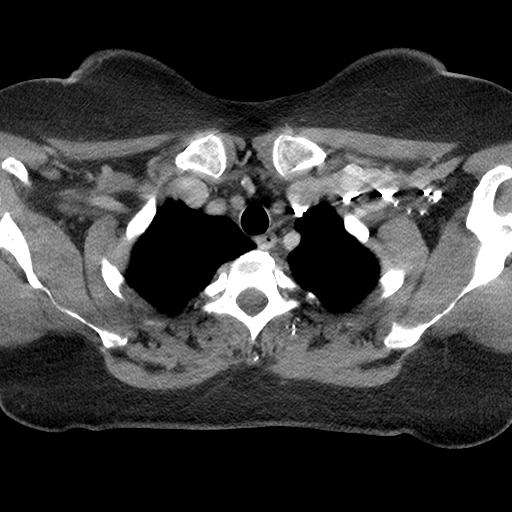
[im 30/117  bone]
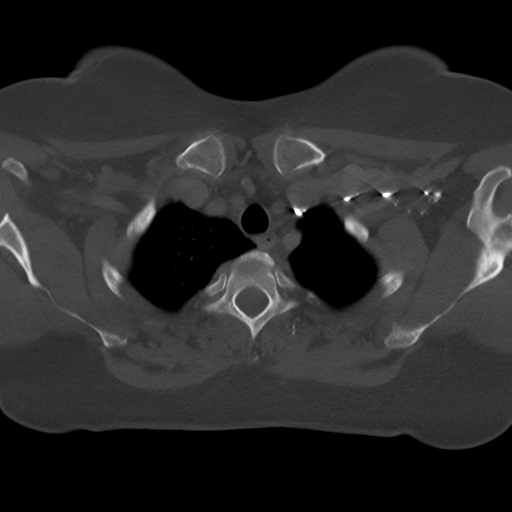
[im 59/117  bone]
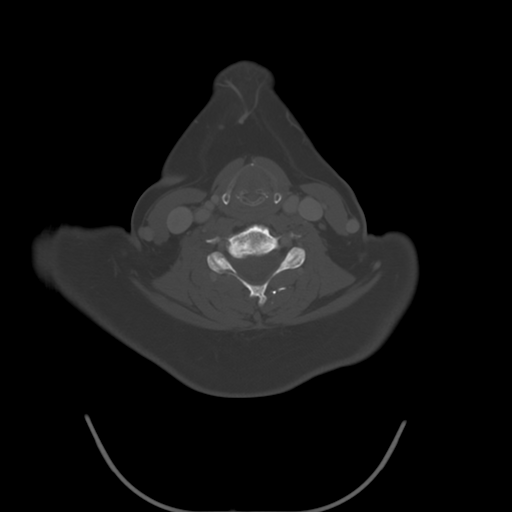
[im 88/117  bone]
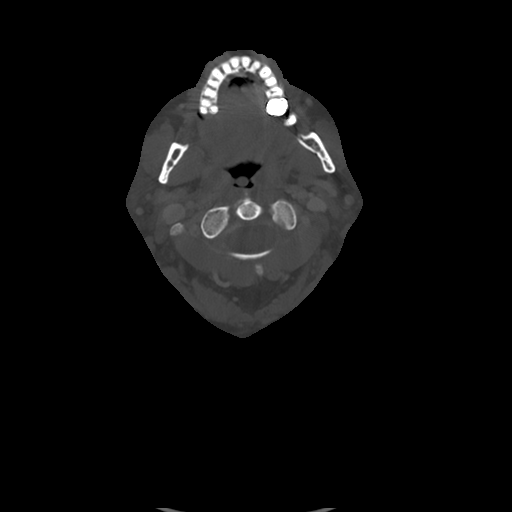

[Series 4: cor neck · coronal · 0.48mm/px · 3 of 111 slices shown]
[im 26/111  bone]
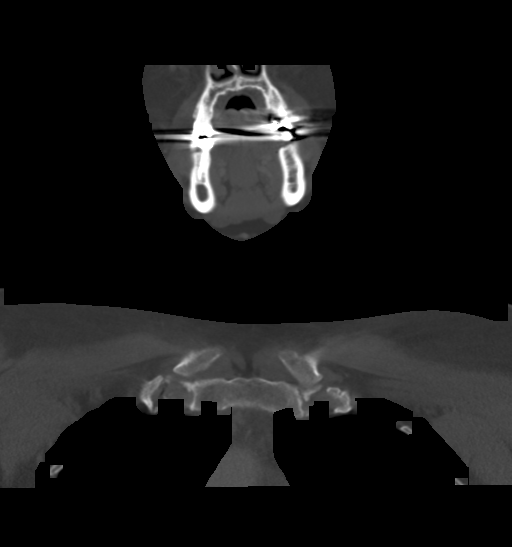
[im 46/111  bone]
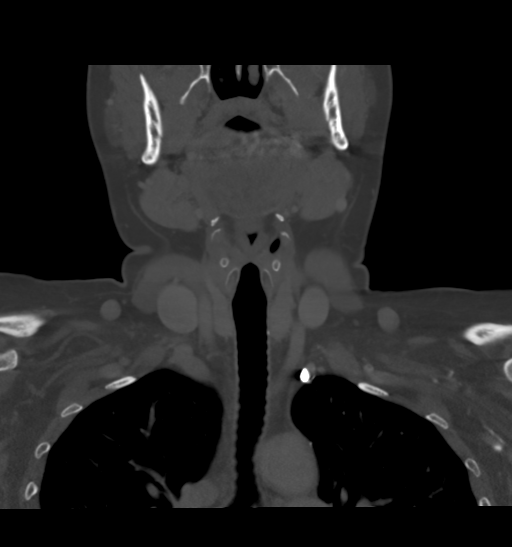
[im 66/111  bone]
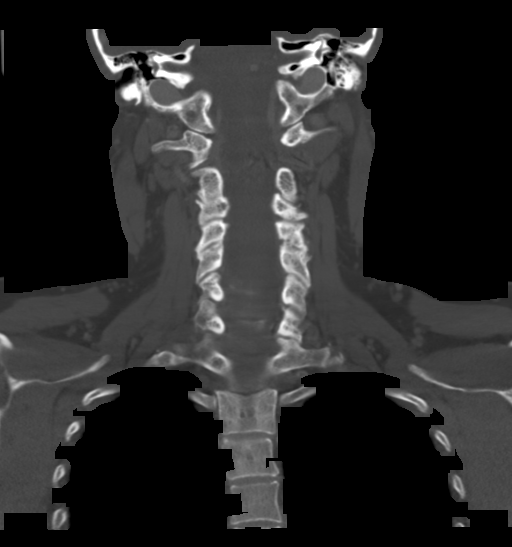

[Series 5: sag neck · sagittal · 0.43mm/px · 5 of 111 slices shown, 6 images]
[im 37/111  bone]
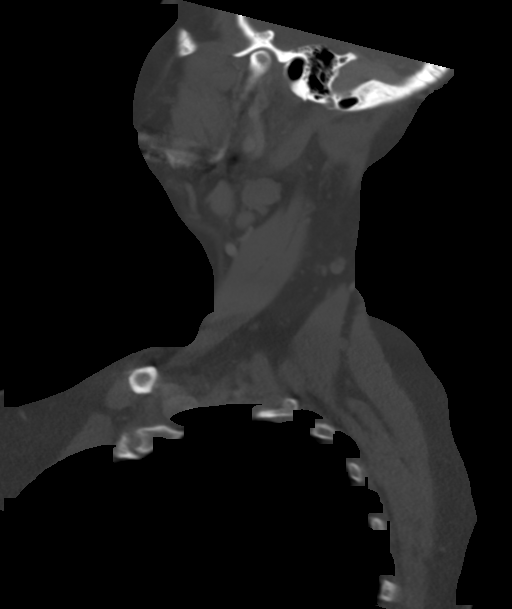
[im 46/111  bone]
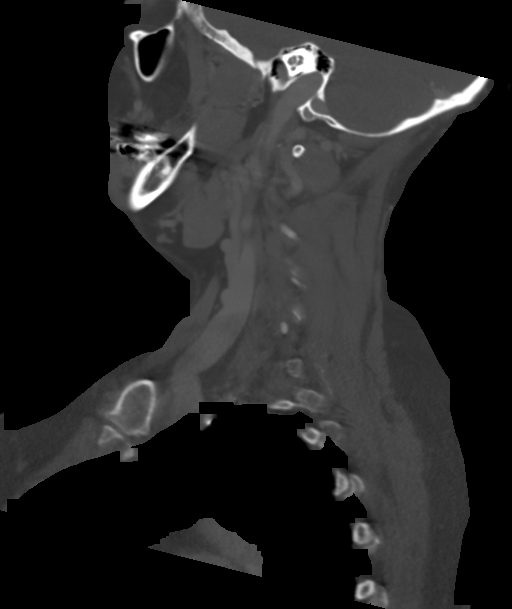
[im 56/111  soft-tissue]
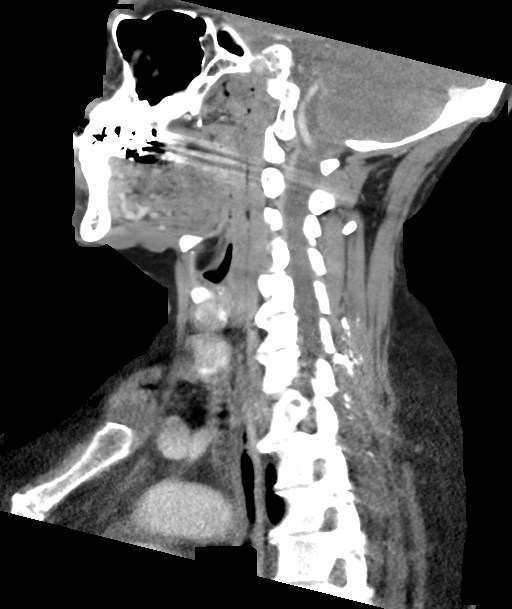
[im 56/111  bone]
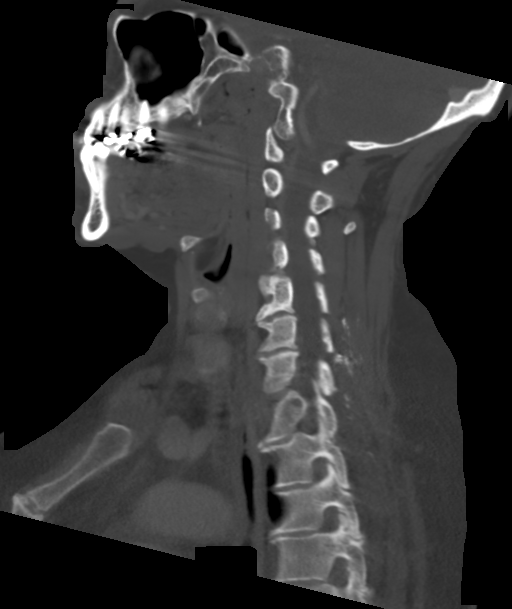
[im 65/111  bone]
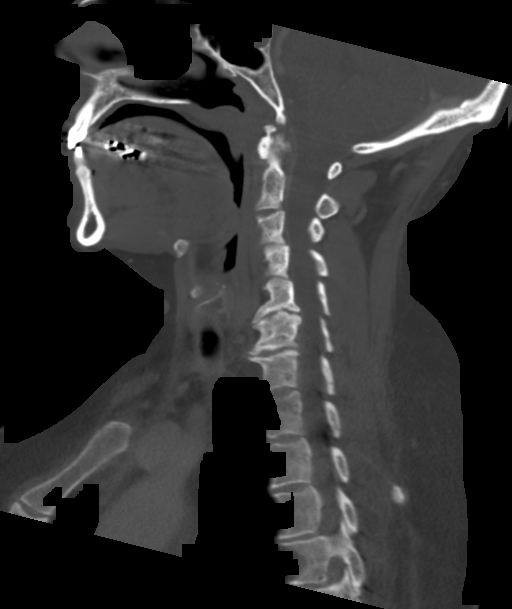
[im 74/111  bone]
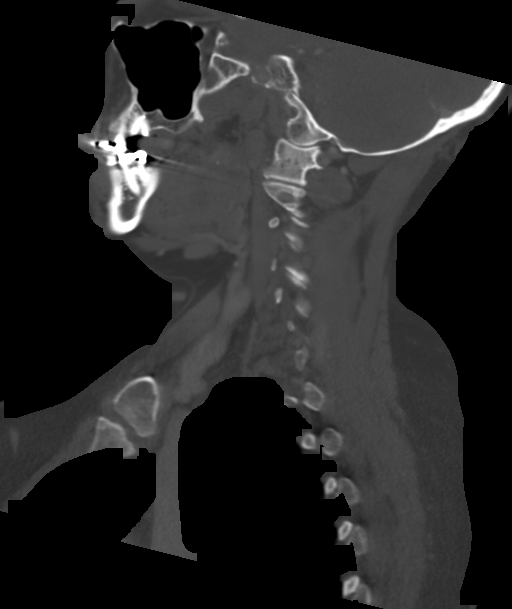

[Series 6: ax oropharynx (person_name) · axial · 0.43mm/px · z∈[-239,-111]mm · 3 of 133 slices shown]
[im 34/133  bone]
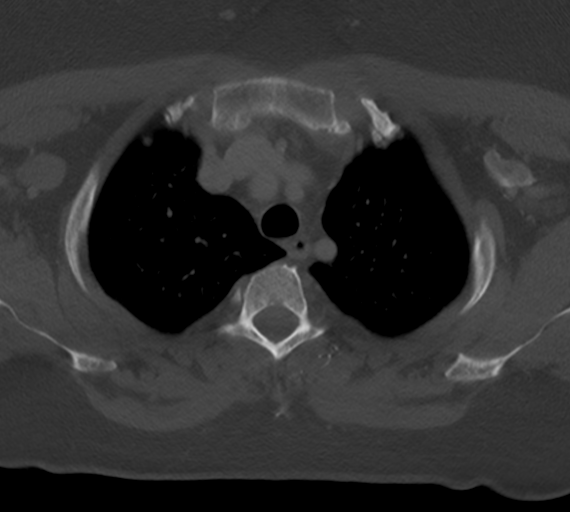
[im 67/133  bone]
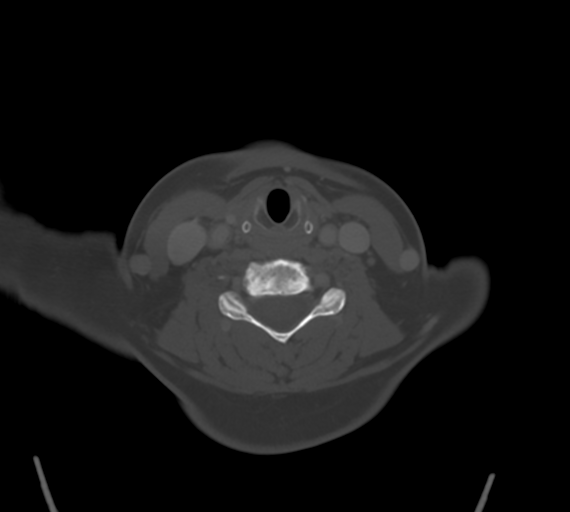
[im 100/133  bone]
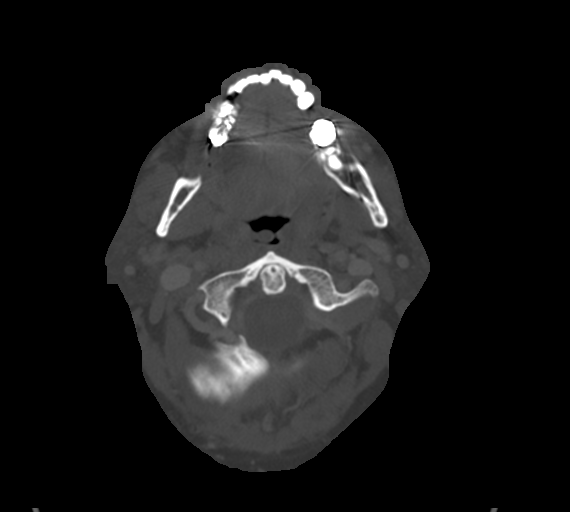

[14 of 33 positions shown; findings below may reference images not displayed]

FINDINGS: Pharynx and larynx: The nasopharynx, oropharynx, and larynx are
normal. Visual portions of the oral cavity, tongue base, and floor
of mouth are normal. Normal epiglottis, vallecula, and piriform
sinuses. The larynx is normal. No retropharyngeal abscess or
effusion.

Salivary glands: No inflammation, mass, or stone.

Thyroid: Normal.

Lymph nodes: Redemonstrated right retropharyngeal lymph node, as
seen on the MRI of the cervical spine, which measures 1.0 x 0.9 x
1.6 cm (series 2, image 27 and series 5, image 38), previously 1.1 x
0.8 x 1.6 cm. The lymph node is normal in density. No other enlarged
or abnormal lymph nodes.

Vascular: Negative.

Limited intracranial: Negative.

Visualized orbits: Negative.

Mastoids and visualized paranasal sinuses: Clear.

Skeleton: Reversal of the normal cervical lordosis, with
redemonstrated 2 mm of anterolisthesis of C3 on C4 and trace
anterolisthesis of C4 on C5. No acute osseous abnormality.

Upper chest: Negative.

Other: None.
IMPRESSION: 1. Redemonstrated prominent right retropharyngeal lymph node, which
is unchanged in size compared to the prior exam, and otherwise
normal in appearance. This lymph node may be reactive. No additional
lymphadenopathy.
2. No acute process in the neck.

## 2020-12-30 MED ORDER — IOHEXOL 350 MG/ML SOLN
60.0000 mL | Freq: Once | INTRAVENOUS | Status: AC | PRN
  Administered 2020-12-30: 60 mL via INTRAVENOUS

## 2021-01-16 ENCOUNTER — Other Ambulatory Visit: Payer: Self-pay | Admitting: Internal Medicine

## 2021-01-16 DIAGNOSIS — M1990 Unspecified osteoarthritis, unspecified site: Secondary | ICD-10-CM

## 2021-01-24 ENCOUNTER — Ambulatory Visit (INDEPENDENT_AMBULATORY_CARE_PROVIDER_SITE_OTHER): Payer: 59 | Admitting: Family Medicine

## 2021-01-24 ENCOUNTER — Other Ambulatory Visit: Payer: Self-pay

## 2021-01-24 ENCOUNTER — Encounter: Payer: Self-pay | Admitting: Family Medicine

## 2021-01-24 VITALS — BP 126/82 | HR 76 | Temp 98.0°F | Ht 66.25 in | Wt 221.0 lb

## 2021-01-24 DIAGNOSIS — Z0000 Encounter for general adult medical examination without abnormal findings: Secondary | ICD-10-CM

## 2021-01-24 DIAGNOSIS — F419 Anxiety disorder, unspecified: Secondary | ICD-10-CM | POA: Diagnosis not present

## 2021-01-24 DIAGNOSIS — R4586 Emotional lability: Secondary | ICD-10-CM

## 2021-01-24 DIAGNOSIS — R5383 Other fatigue: Secondary | ICD-10-CM | POA: Diagnosis not present

## 2021-01-24 DIAGNOSIS — Z1211 Encounter for screening for malignant neoplasm of colon: Secondary | ICD-10-CM

## 2021-01-24 DIAGNOSIS — E782 Mixed hyperlipidemia: Secondary | ICD-10-CM

## 2021-01-24 DIAGNOSIS — F32A Depression, unspecified: Secondary | ICD-10-CM

## 2021-01-24 DIAGNOSIS — N951 Menopausal and female climacteric states: Secondary | ICD-10-CM

## 2021-01-24 DIAGNOSIS — J4520 Mild intermittent asthma, uncomplicated: Secondary | ICD-10-CM

## 2021-01-24 DIAGNOSIS — R7303 Prediabetes: Secondary | ICD-10-CM | POA: Diagnosis not present

## 2021-01-24 MED ORDER — BUDESONIDE-FORMOTEROL FUMARATE 160-4.5 MCG/ACT IN AERO: 2.0000 | INHALATION_SPRAY | Freq: Two times a day (BID) | RESPIRATORY_TRACT | 1 refills | Status: DC | PRN

## 2021-01-24 MED ORDER — ATORVASTATIN CALCIUM 20 MG PO TABS: 20.0000 mg | ORAL_TABLET | Freq: Every day | ORAL | 1 refills | Status: DC

## 2021-01-24 NOTE — Patient Instructions (Addendum)
Stop the fenofibrate and start on atorvastatin. If you have any muscle aches or cramps you can take Co-Q 10 over the counter.   Follow-up in 6 weeks for a lab visit to recheck your fasting cholesterol.  You will hear from [place] with [facility] regarding the flu shot.   You will hear from [facility] gastroenterology to schedule a visit to talk about colon cancer screening.  You will also hear from the gynecology office to schedule a visit for menopause.

## 2021-01-24 NOTE — Progress Notes (Signed)
Subjective:    Patient ID: Kristi Decker, female    DOB: [birth year], 45 y.o.   MRN: [id number]  HPI Chief Complaint  Patient presents with   Annual Exam    Concerns about weight, elevated cholesterol levels and hot flashes   She is here for a complete physical exam. Brought in labs done elsewhere in July 2022.   Normal CBC, uric acid, folic acid, B12, TSH, free T4, vitamin D. Normal liver function, LDH, iron. Glucose 91. GFR 109 creatinine 0.67   Other providers: Orthopedist- Dr. August Saucer Rheumatologist- in [place] diagnosed with fibromyalgia  Psychiatrist- Gaye Pollack- virtual  Therapy online   Full year without period as of March 2022 but now she is having severe menopausal symptoms including fatigue, mood swings, weight gain, achy feeling all over somedays.  Reports taking Black kohosh  She is using a fan in the office currently.  Is only interested in HRT as a last resort.    Prediabetes - Hgb A1c 5.7% in 11/2020  Takes seroquel for sleep.   HL and hypertriglyceridemia- Tricor, fish oil  States she has never been on a statin.  LDL 174, HDL 39, trig 213 and ratio 6.5 in July 2022.    Asthma and chronic bronchitis-reports using Symbicort and rarely needs albuterol Never smoked.     Social history: widow, remarried and has 2 kids, 1 grandchild, works from home with 2 kids Denies smoking, drinking alcohol, drug use  Diet: fairly healthy  Excerise: none   Immunizations: interested in [place] flu study.   Health maintenance:   Mammogram: 2 months ago  Colonoscopy: never  Last Gynecological Exam: last year  Last Menstrual cycle: 07/2019 Last Dental Exam: last year  Last Eye Exam: in the past year   Wears seatbelt always, uses sunscreen, smoke detectors in home and functioning, does not text while driving and feels safe in home environment.   Reviewed allergies, medications, past medical, surgical, family, and social history.     Review of Systems Review of  Systems Constitutional: -fever, -chills, -+sweats, -unexpected weight change, +fatigue ENT: -runny nose, -ear pain, -sore throat Cardiology:  -chest pain, -palpitations, -edema Respiratory: -cough, -shortness of breath, -wheezing Gastroenterology: -abdominal pain, -nausea, -vomiting, -diarrhea, -constipation  Hematology: -bleeding or bruising problems Musculoskeletal: -arthralgias, -myalgias, -joint swelling, -back pain Ophthalmology: -vision changes Urology: -dysuria, -difficulty urinating, -hematuria, -urinary frequency, -urgency Neurology: -headache, -weakness, -tingling, -numbness       Objective:   Physical Exam BP 126/82 (BP Location: Left Arm, Patient Position: Sitting, Cuff Size: Normal)   Pulse 76   Temp 98 F (36.7 C) (Oral)   Ht 5' 6.25" (1.683 m)   Wt 221 lb (100.2 kg)   LMP 05/16/2019   SpO2 98%   BMI 35.40 kg/m   General Appearance:    Alert, cooperative, no distress, appears stated age  Head:    Normocephalic, without obvious abnormality, atraumatic  Eyes:    PERRL, conjunctiva/corneas clear, EOM's intact  Ears:    Normal TM's and external ear canals  Nose:   Mask on   Throat:   Mask on   Neck:   Supple, no lymphadenopathy;  thyroid:  no   enlargement/tenderness/nodules; no JVD  Back:    Spine nontender, no curvature, ROM normal, no CVA     tenderness  Lungs:     Clear to auscultation bilaterally without wheezes, rales or     ronchi; respirations unlabored  Chest Wall:    No tenderness or deformity   Heart:  Regular rate and rhythm, S1 and S2 normal, no murmur, rub   or gallop  Breast Exam:    OB/GYN  Abdomen:     Soft, non-tender, nondistended, normoactive bowel sounds,    no masses, no hepatosplenomegaly  Genitalia:    OB/GYN     Extremities:   No clubbing, cyanosis or edema  Pulses:   2+ and symmetric all extremities  Skin:   Skin color, texture, turgor normal, no rashes or lesions  Lymph nodes:   Cervical, supraclavicular, and axillary nodes normal   Neurologic:   CNII-XII intact, normal strength, sensation and gait          Psych:   Normal mood, affect, hygiene and grooming.        Assessment & Plan:  Routine general medical examination at a health care facility Health care reviewed.  Counseling on healthy lifestyle including diet and exercise.  Recommend regular dental and eye exams.  Referral to OB/GYN.  Immunizations reviewed.  She is interested in the flu vaccine study with [facility]  Prediabetes -Recommend low-carb diet  Fatigue, unspecified type - Plan: Ambulatory referral to Gynecology -Reviewed labs with patient from July.  Declines labs today.  Fatigue has been ongoing.  Discussed possible etiologies and she is adamant that menopause is the reason.  Referral to gynecology.  Mood swings - Plan: Ambulatory referral to Gynecology -Reports mood swings are worse since menopause.  She is also follow-up with psych.  Anxiety and depression -Followed by psychiatry  Screen for colon cancer - Plan: Ambulatory referral to Gastroenterology -Done per screening guidelines  Mixed hyperlipidemia - Plan: atorvastatin (LIPITOR) 20 MG tablet -She has only been on fenofibrate in the past and this was started by a different provider.  She has never been on a statin.  She will stop fenofibrate and start Lipitor.  May try co-Q10 if symptomatic.  Follow-up for a lab visit in 6 weeks.  Will need fasting lipids and ALT.  Mild intermittent asthma without complication - Plan: budesonide-formoterol (SYMBICORT) 160-4.5 MCG/ACT inhaler -Controlled.  Continue Symbicort and albuterol as needed  Hot flashes, menopausal - Plan: Ambulatory referral to Gynecology -Reports trying all over-the-counter products.  Is hesitant regarding HRT but will refer her to gynecology for further discussion.  Reviewed labs from July and thyroid function was normal as well as CBC and CMP.

## 2021-01-27 ENCOUNTER — Other Ambulatory Visit: Payer: Self-pay

## 2021-01-27 ENCOUNTER — Encounter: Payer: Self-pay | Admitting: Family Medicine

## 2021-01-27 ENCOUNTER — Telehealth (INDEPENDENT_AMBULATORY_CARE_PROVIDER_SITE_OTHER): Payer: 59 | Admitting: Family Medicine

## 2021-01-27 VITALS — Wt 221.0 lb

## 2021-01-27 DIAGNOSIS — J0140 Acute pansinusitis, unspecified: Secondary | ICD-10-CM | POA: Diagnosis not present

## 2021-01-27 DIAGNOSIS — J4520 Mild intermittent asthma, uncomplicated: Secondary | ICD-10-CM

## 2021-01-27 MED ORDER — AMOXICILLIN 875 MG PO TABS
875.0000 mg | ORAL_TABLET | Freq: Two times a day (BID) | ORAL | 0 refills | Status: DC
Start: 2021-01-27 — End: 2021-02-03

## 2021-01-27 NOTE — Progress Notes (Signed)
   Subjective:  Documentation for virtual audio and video telecommunications through Caregility encounter:  The patient was located at home. 2 patient identifiers used.  The provider was located at home. The patient did consent to this visit and is aware of possible charges through their insurance for this visit.  The other persons participating in this telemedicine service were none. Time spent on call was 15 minutes and in review of previous records 20 minutes total.  This virtual service is not related to other E/M service within previous 7 days.   Patient ID: Kristi Decker, female    DOB: 1976-04-20, 45 y.o.   MRN: [id number]  HPI Chief Complaint  Patient presents with   other    Sinus infection, had symptoms for about 1 week, sinus pressure, congestion head ache, cough, tightness in chest off and on. Green and brown mucous.    Complains of a 1 1/2 week history of sinus pressure, nasal congestion, purulent nasal drainage, frontal headache, post nasal drainage and cough. Some mild chest tightness. Hx of asthma. Used symbicort inhaler this morning.  Has not needed albuterol.  Taking Dayquil.  No recent antibiotics   Denies fever, chills, dizziness, palpitations, shortness of breath, abdominal pain, N/V/D, LE edema.   Reviewed allergies, medications, past medical, surgical, family, and social history.     Review of Systems Pertinent positives and negatives in the history of present illness.     Objective:   Physical Exam Wt 221 lb (100.2 kg)   LMP 05/16/2019   BMI 35.40 kg/m   Alert and oriented in no acute distress.  Respirations unlabored.  Speaking in complete sentences without difficulty.      Assessment & Plan:  Acute non-recurrent pansinusitis - Plan: amoxicillin (AMOXIL) 875 MG tablet  Mild intermittent asthma without complication  Reports having 1 negative COVID test at home since symptoms started.  She will test again today and let us know her result.   Discussed in depth treatment for symptoms.  Recommend good hydration.  I recommend she use her Symbicort daily and may use albuterol as needed over the next few days.  I will prescribe an antibiotic due to course of her illness.  She will follow-up if worsening or if not back to baseline when she completes the antibiotic.

## 2021-01-28 ENCOUNTER — Encounter (HOSPITAL_BASED_OUTPATIENT_CLINIC_OR_DEPARTMENT_OTHER): Payer: Self-pay

## 2021-01-28 ENCOUNTER — Encounter (HOSPITAL_BASED_OUTPATIENT_CLINIC_OR_DEPARTMENT_OTHER): Payer: Self-pay | Admitting: Medical

## 2021-01-28 ENCOUNTER — Ambulatory Visit (INDEPENDENT_AMBULATORY_CARE_PROVIDER_SITE_OTHER): Payer: 59 | Admitting: Medical

## 2021-01-28 ENCOUNTER — Other Ambulatory Visit: Payer: Self-pay

## 2021-01-28 VITALS — BP 142/93 | HR 90 | Ht 66.0 in | Wt 220.8 lb

## 2021-01-28 DIAGNOSIS — N951 Menopausal and female climacteric states: Secondary | ICD-10-CM | POA: Diagnosis not present

## 2021-01-28 MED ORDER — ESTRADIOL 0.025 MG/24HR TD PTTW: 1.0000 | MEDICATED_PATCH | TRANSDERMAL | 12 refills | Status: DC

## 2021-01-28 MED ORDER — PROGESTERONE MICRONIZED 100 MG PO CAPS
100.0000 mg | ORAL_CAPSULE | Freq: Every day | ORAL | 12 refills | Status: DC
Start: 2021-01-28 — End: 2022-03-08

## 2021-01-28 NOTE — Progress Notes (Signed)
   History:  [name] is a 45 y.o. [id number] who presents to clinic today for worsening hot flashes associated with menopause. The patient had her last period approximately a year and a half ago. When I saw her for her annual exam in May the symptoms were manageable. She states that now they are significantly worse and impacting her day-to-day activities as well as her sleep. They are affecting her mood as well.  She has tried Vitamin E and Black Cohosh without relief.   The following portions of the patient's history were reviewed and updated as appropriate: allergies, current medications, family history, past medical history, social history, past surgical history and problem list.  Review of Systems:  Review of Systems  Constitutional:  Negative for fever and malaise/fatigue.  Gastrointestinal:  Negative for abdominal pain, constipation, diarrhea, nausea and vomiting.  Genitourinary:        Neg - vaginal bleeding     Objective:  Physical Exam BP (!) 142/93 (BP Location: Right Arm, Patient Position: Sitting, Cuff Size: Large)   Pulse 90   Ht 5\' 6"  (1.676 m) Comment: reported  Wt 220 lb 12.8 oz (100.2 kg)   LMP 05/16/2019   BMI 35.64 kg/m  Physical Exam Vitals and nursing note reviewed.  Constitutional:      General: She is not in acute distress.    Appearance: She is well-developed. She is obese.  HENT:     Head: Normocephalic and atraumatic.  Cardiovascular:     Rate and Rhythm: Normal rate.  Pulmonary:     Effort: Pulmonary effort is normal.  Abdominal:     General: There is no distension.     Palpations: Abdomen is soft.  Skin:    General: Skin is warm and dry.     Findings: No erythema.  Neurological:     Mental Status: She is alert and oriented to person, place, and time.    Health Maintenance Due  Topic Date Due   HIV Screening  Never done   Hepatitis C Screening  Never done   COLONOSCOPY (Pts 45-44yrs Insurance coverage will need to be confirmed)  Never  done   COVID-19 Vaccine (3 - Pfizer risk series) 10/12/2019   INFLUENZA VACCINE  12/13/2020   Pneumococcal Vaccine 77-69 Years old (2 - PCV) 02/03/2021     Assessment & Plan:  1. Hot flashes due to menopause - Reviewed medication options with patient, due to multiple other psychiatric medications adding SSRI qhs is not an option - Risks vs benefits of hormone therapy reviewed and patient is agreeable to trial  - Reviewed patient with Dr. 02/05/2021 for recommended therapy to start - estradiol (VIVELLE-DOT) 0.025 MG/24HR; Place 1 patch onto the skin 2 (two) times a week.  Dispense: 8 patch; Refill: 12 - progesterone (PROMETRIUM) 100 MG capsule; Take 1 capsule (100 mg total) by mouth at bedtime.  Dispense: 30 capsule; Refill: 12 - Patient notified of regimen and advised to contact the office if symptoms have not improved with 2 weeks of this regimen as doses can be adjusted  Approximately 15 minutes of total time was spent with this patient on history taking, coordination of care, patient education, prescription management and documentation  Penne Lash 01/28/2021 11:39 AM

## 2021-02-02 ENCOUNTER — Encounter: Payer: Self-pay | Admitting: Family Medicine

## 2021-02-03 ENCOUNTER — Other Ambulatory Visit: Payer: Self-pay | Admitting: Family Medicine

## 2021-02-03 MED ORDER — AMOXICILLIN-POT CLAVULANATE 875-125 MG PO TABS: 1.0000 | ORAL_TABLET | Freq: Two times a day (BID) | ORAL | 0 refills | Status: DC

## 2021-02-10 ENCOUNTER — Ambulatory Visit: Payer: 59 | Admitting: Internal Medicine

## 2021-02-16 ENCOUNTER — Other Ambulatory Visit: Payer: Self-pay | Admitting: Internal Medicine

## 2021-02-16 DIAGNOSIS — M797 Fibromyalgia: Secondary | ICD-10-CM

## 2021-02-16 DIAGNOSIS — M1990 Unspecified osteoarthritis, unspecified site: Secondary | ICD-10-CM

## 2021-02-20 ENCOUNTER — Encounter: Payer: Self-pay | Admitting: Family Medicine

## 2021-02-21 ENCOUNTER — Other Ambulatory Visit: Payer: Self-pay | Admitting: Family Medicine

## 2021-02-21 MED ORDER — FLUCONAZOLE 150 MG PO TABS: 150.0000 mg | ORAL_TABLET | Freq: Once | ORAL | 0 refills | Status: AC

## 2021-03-07 ENCOUNTER — Other Ambulatory Visit: Payer: 59

## 2021-03-07 ENCOUNTER — Other Ambulatory Visit: Payer: Self-pay

## 2021-03-07 DIAGNOSIS — J0140 Acute pansinusitis, unspecified: Secondary | ICD-10-CM

## 2021-03-07 DIAGNOSIS — E782 Mixed hyperlipidemia: Secondary | ICD-10-CM

## 2021-03-07 LAB — LIPID PANEL
Chol/HDL Ratio: 4.8 ratio — ABNORMAL HIGH (ref 0.0–4.4)
Cholesterol, Total: 176 mg/dL (ref 100–199)
HDL: 37 mg/dL — ABNORMAL LOW (ref >39)
LDL Chol Calc (NIH): 99 mg/dL (ref 0–99)
Triglycerides: 232 mg/dL — ABNORMAL HIGH (ref 0–149)
VLDL Cholesterol Cal: 40 mg/dL (ref 5–40)

## 2021-03-07 LAB — ALT: ALT: 39 IU/L — ABNORMAL HIGH (ref 0–32)

## 2021-03-07 NOTE — Progress Notes (Signed)
Kristi Decker pt . Labs per her note 01/24/21 [facility]

## 2021-03-20 ENCOUNTER — Telehealth (INDEPENDENT_AMBULATORY_CARE_PROVIDER_SITE_OTHER): Payer: 59 | Admitting: Family Medicine

## 2021-03-20 DIAGNOSIS — H1033 Unspecified acute conjunctivitis, bilateral: Secondary | ICD-10-CM | POA: Diagnosis not present

## 2021-03-20 MED ORDER — ERYTHROMYCIN 5 MG/GM OP OINT
1.0000 | TOPICAL_OINTMENT | Freq: Three times a day (TID) | OPHTHALMIC | 0 refills | Status: DC
Start: 2021-03-20 — End: 2021-04-15

## 2021-03-20 NOTE — Progress Notes (Signed)
   Subjective:    Patient ID: Kristi Decker, female    DOB: [birth year], 45 y.o.   MRN: [id number]  HPI Documentation for virtual audio telecommunications.The patient was located at home. 2 patient identifiers used.  The provider was located at home,The patient did consent to this visit and is aware of possible charges through their insurance for this visit.  Informed her that I would run this through as a regular office visit. The other persons participating in this telemedicine service were none. Time spent on call was 3 minutes and in review of previous records >10 minutes total for counseling and coordination of care.  This virtual service is not related to other E/M service within previous 7 days.  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  She apparently woke up this morning and noted purulent drainage coming from both eyes.  It started him on and then progressed to the other on as of yesterday.  She is having no other symptoms.  Review of Systems     Objective:   Physical Exam Patient not examined       Assessment & Plan:  Acute bacterial conjunctivitis of both eyes - Plan: erythromycin ophthalmic ointment The history is certainly consistent with bacterial conjunctivitis and I will treated as such.

## 2021-03-21 ENCOUNTER — Telehealth: Payer: Self-pay

## 2021-03-21 ENCOUNTER — Other Ambulatory Visit: Payer: Self-pay | Admitting: Family Medicine

## 2021-03-21 MED ORDER — PANTOPRAZOLE SODIUM 40 MG PO TBEC: 40.0000 mg | DELAYED_RELEASE_TABLET | Freq: Every day | ORAL | 0 refills | Status: DC

## 2021-03-21 NOTE — Telephone Encounter (Signed)
Received refill request attached, protocol passed and prescription sent

## 2021-03-24 ENCOUNTER — Encounter: Payer: Self-pay | Admitting: Family Medicine

## 2021-03-24 ENCOUNTER — Telehealth (INDEPENDENT_AMBULATORY_CARE_PROVIDER_SITE_OTHER): Payer: 59 | Admitting: Family Medicine

## 2021-03-24 ENCOUNTER — Other Ambulatory Visit: Payer: Self-pay

## 2021-03-24 VITALS — Temp 98.3°F | Wt 220.0 lb

## 2021-03-24 DIAGNOSIS — J4520 Mild intermittent asthma, uncomplicated: Secondary | ICD-10-CM | POA: Diagnosis not present

## 2021-03-24 DIAGNOSIS — J209 Acute bronchitis, unspecified: Secondary | ICD-10-CM | POA: Diagnosis not present

## 2021-03-24 MED ORDER — AMOXICILLIN 875 MG PO TABS: 875.0000 mg | ORAL_TABLET | Freq: Two times a day (BID) | ORAL | 0 refills | Status: DC

## 2021-03-24 MED ORDER — FLUCONAZOLE 150 MG PO TABS: 150.0000 mg | ORAL_TABLET | Freq: Once | ORAL | 0 refills | Status: AC

## 2021-03-24 NOTE — Progress Notes (Signed)
   Subjective:    Patient ID: Kristi Decker, female    DOB: 1976/01/12, 45 y.o.   MRN: [id number]  HPI Documentation for virtual audio and video telecommunications through Caregility encounter: The patient was located at home. 2 patient identifiers used.  The provider was located in the office. The patient did consent to this visit and is aware of possible charges through their insurance for this visit. The other persons participating in this telemedicine service were none. Time spent on call was 5 minutes and in review of previous records >14 minutes total for counseling and coordination of care. This virtual service is not related to other E/M service within previous 7 days.  She was recently treated for conjunctivitis and states that she is now back to normal however 4 days ago she had the acute onset of sore throat, productive cough, malaise fatigue and myalgias.  No fever or chills or earache.  She does have underlying asthma and is also using her albuterol inhaler.  Review of Systems     Objective:   Physical Exam  Alert and in no distress otherwise not examined      Assessment & Plan:   Acute bronchitis, unspecified organism - Plan: amoxicillin (AMOXIL) 875 MG tablet  Mild intermittent asthma without complication I explained that the abrupt onset of the productive cough makes me concerned about a bacterial infection we will therefore go ahead and treat her.  She is to continue on her asthma medicine and albuterol inhaler.  I will also give her Diflucan as she does usually get yeast infections with an antibiotic.

## 2021-03-25 ENCOUNTER — Other Ambulatory Visit: Payer: Self-pay

## 2021-03-25 DIAGNOSIS — J4520 Mild intermittent asthma, uncomplicated: Secondary | ICD-10-CM

## 2021-03-25 MED ORDER — BUDESONIDE-FORMOTEROL FUMARATE 160-4.5 MCG/ACT IN AERO: 2.0000 | INHALATION_SPRAY | Freq: Two times a day (BID) | RESPIRATORY_TRACT | 1 refills | Status: DC | PRN

## 2021-03-28 ENCOUNTER — Telehealth: Payer: 59 | Admitting: Family Medicine

## 2021-04-11 ENCOUNTER — Telehealth: Payer: Self-pay

## 2021-04-11 MED ORDER — LISINOPRIL-HYDROCHLOROTHIAZIDE 20-12.5 MG PO TABS: 1.0000 | ORAL_TABLET | Freq: Every day | ORAL | 1 refills | Status: DC

## 2021-04-11 NOTE — Telephone Encounter (Signed)
Recv'd fax from [facility] requesting refill for Lisinopril-HCTZ 20-12.5

## 2021-04-11 NOTE — Telephone Encounter (Signed)
done

## 2021-04-15 ENCOUNTER — Other Ambulatory Visit: Payer: Self-pay

## 2021-04-15 ENCOUNTER — Encounter: Payer: Self-pay | Admitting: Family Medicine

## 2021-04-15 ENCOUNTER — Telehealth (INDEPENDENT_AMBULATORY_CARE_PROVIDER_SITE_OTHER): Payer: 59 | Admitting: Family Medicine

## 2021-04-15 VITALS — Temp 97.6°F | Wt 211.0 lb

## 2021-04-15 DIAGNOSIS — E669 Obesity, unspecified: Secondary | ICD-10-CM | POA: Diagnosis not present

## 2021-04-15 NOTE — Progress Notes (Signed)
   Subjective:    Patient ID: Kristi Decker, female    DOB: 01-06-1976, 45 y.o.   MRN: [id number]  HPI Documentation for virtual audio and video telecommunications through Caregility encounter: The patient was located at home. 2 patient identifiers used.  The provider was located in the office. The patient did consent to this visit and is aware of possible charges through their insurance for this visit. The other persons participating in this telemedicine service were none. Time spent on call was 5 minutes and in review of previous records >20 minutes total for counseling and coordination of care. This virtual service is not related to other E/M service within previous 7 days.  She is here to discuss diet.  She saw in the chart that she was considered obese and that got her to working harder on her diet and she is now on what is called the lazy keto diet.  She has lost approximately 9 pounds.  Review of Systems     Objective:   Physical Exam Alert and in no distress otherwise not examined       Assessment & Plan:  Obesity (BMI 30-39.9) I discussed the diagnosis of obesity and the risk to her health concerning this.  Discussed diet in regard to cutting back on carbohydrates like she is doing with the keto diet.  Explained that this is also similar to the [employer], Mediterranean and even the American diabetes Association diet.  Discussed the fact that some these changes need to be made permanent and that unless it is made permanent she will gain the weight back.  Also strongly encouraged her to get involved in an exercise program of at least 20 minutes of something physical on a daily basis.  We discussed weight has the wrong goal to set and it should be for her particular dress or pant size.  Also discussed various medications that can be used and also the possibility of weight loss surgery.  She expressed understanding of this and appreciation that I discussed this with her.

## 2021-05-14 ENCOUNTER — Other Ambulatory Visit: Payer: Self-pay | Admitting: Internal Medicine

## 2021-05-14 DIAGNOSIS — M797 Fibromyalgia: Secondary | ICD-10-CM

## 2021-05-14 DIAGNOSIS — M1990 Unspecified osteoarthritis, unspecified site: Secondary | ICD-10-CM

## 2021-05-17 NOTE — Telephone Encounter (Signed)
Next Visit: Was due August 2022. Message sent to the front to schedule.   Last Visit: 11/16/2020  Last Fill: 02/16/2021  DX: Inflammatory arthritis   Current Dose per office note 11/16/2020: diclofenac 25 mg twice daily at this time.  Labs: 12/06/2020 BUN/Creatinine Ratio 31, Calcium 10.3, Phosphorus 4.6  Okay to refill Diclofenac?

## 2021-05-17 NOTE — Telephone Encounter (Signed)
Please schedule patient for a follow up visit. Patient was due August 2022. Thanks!  

## 2021-05-20 ENCOUNTER — Other Ambulatory Visit: Payer: Self-pay

## 2021-05-20 ENCOUNTER — Telehealth (INDEPENDENT_AMBULATORY_CARE_PROVIDER_SITE_OTHER): Payer: 59 | Admitting: Family Medicine

## 2021-05-20 ENCOUNTER — Encounter: Payer: Self-pay | Admitting: Family Medicine

## 2021-05-20 VITALS — Temp 97.0°F | Wt 208.0 lb

## 2021-05-20 DIAGNOSIS — J4540 Moderate persistent asthma, uncomplicated: Secondary | ICD-10-CM | POA: Diagnosis not present

## 2021-05-20 DIAGNOSIS — B349 Viral infection, unspecified: Secondary | ICD-10-CM | POA: Diagnosis not present

## 2021-05-20 NOTE — Progress Notes (Signed)
° °  Subjective:    Patient ID: Otila Back, female    DOB: 02/13/1976, 46 y.o.   MRN: [id number]  HPI Documentation for virtual audio and video telecommunications through [place] encounter: The patient was located at home. 2 patient identifiers used.  The provider was located in the office. The patient did consent to this visit and is aware of possible charges through their insurance for this visit. The other persons participating in this telemedicine service were none. Time spent on call was 5 minutes and in review of previous records >17 minutes total for counseling and coordination of care. This virtual service is not related to other E/M service within previous 7 days.  She states that on Monday she developed sinus pressure, myalgias, malaise, eventually postnasal drainage with a slight cough.  No fever, sore throat or earache.  She has not been using her Symbicort on a regular basis but has been using albuterol.  Review of Systems     Objective:   Physical Exam Alert and in no distress.  Respiratory rate appears normal.       Assessment & Plan:  Viral syndrome  Moderate persistent asthma without complication Cautioned her to use her Symbicort on a regular basis and then albuterol as needed.  Symptomatic treatment from the sinus pressure with decongestant, Tylenol for the aches and pains.  She will call if she gets worse.  She was comfortable with that.

## 2021-05-26 ENCOUNTER — Other Ambulatory Visit: Payer: Self-pay | Admitting: Family Medicine

## 2021-05-26 DIAGNOSIS — J4520 Mild intermittent asthma, uncomplicated: Secondary | ICD-10-CM

## 2021-05-26 NOTE — Telephone Encounter (Signed)
[facility] is requesting to fill pt symbicort. Please advise [facility]

## 2021-05-30 NOTE — Progress Notes (Deleted)
Office Visit Note  Patient: Kristi Decker             Date of Birth: [birth year]           MRN: [id number]             PCP: Marcellina Millin Referring: Girtha Rm, PA-C Visit Date: 05/31/2021   Subjective:  No chief complaint on file.   History of Present Illness: Kristi Decker is a 46 y.o. female here for follow up for polyarticular joint pain with history of seronegative rheumatoid arthritis and generalized osteoarthritis on sulfasalazine 1000 mg BID and diclofenac 75 mg.***   Previous HPI 11/16/20 Kristi Decker is a 46 y.o. female here for chronic polyarticular inflammatory joint pain.  She was previously diagnosed with seronegative rheumatoid arthritis since about 2020 with previous treatment on oral diclofenac and sulfasalazine with apparently a significant response to treatment.  She also has chronic degenerative arthritis of multiple areas including the back for which she is undergone ESI and her left knee that underwent total knee arthroplasty with Dr. Marlou Sa earlier this year with a very great improvement in joint pain symptoms.  She discontinued her medication perioperatively and felt that her all over joint pains worsened very noticeably with treatment interruption.  She does also have history for suspected fibromyalgia contributing to generalized joint pains as well as associated symptoms of chronic fatigue and unrefreshing sleep, exertion intolerance worsening symptoms for a day longer afterwards, episodic all over pain that feels like having the flu, previous migraine headaches that have improved, and frequent feeling of cognitive impairment or difficulty.    No Rheumatology ROS completed.   PMFS History:  Patient Active Problem List   Diagnosis Date Noted   Moderate persistent asthma without complication [id number]   Arthritis of left knee    S/P total knee arthroplasty, left 08/17/2020   Inflammatory arthritis 10/10/2019   GAD (generalized anxiety disorder)  06/28/2018   Primary osteoarthritis of left knee 04/17/2018   Prediabetes 12/03/2017   Irritable bowel 03/19/2017   GERD without esophagitis 03/19/2017   Fibromyalgia 11/28/2016   Migraine without aura and without status migrainosus, not intractable 10/17/2016   Essential hypertension 10/17/2016    Past Medical History:  Diagnosis Date   Anemia    Anxiety    Arthritis    left knee   Asthma    COPD (chronic obstructive pulmonary disease) (HCC)    Depression    GERD (gastroesophageal reflux disease)    High cholesterol    Per patient   Hypertension    Neuromuscular disorder ([place])    right arm   PTSD (post-traumatic stress disorder)    Per patient    Family History  Problem Relation Age of Onset   Rheum arthritis Mother    Hypertension Mother    Depression Mother    Anxiety disorder Mother    Bipolar disorder Father    Mental illness Sister    Depression Sister    Congestive Heart Failure Maternal Grandfather    Past Surgical History:  Procedure Laterality Date   DILATION AND CURETTAGE OF UTERUS     2002   TOTAL KNEE ARTHROPLASTY Left 08/17/2020   Procedure: LEFT TOTAL KNEE ARTHROPLASTY;  Surgeon: Meredith Pel, MD;  Location: [facility];  Service: Orthopedics;  Laterality: Left;   UPPER GI ENDOSCOPY     WISDOM TOOTH EXTRACTION     Social History   Social History Narrative   Not on file   Immunization  History  Administered Date(s) Administered   Influenza,inj,Quad PF,6+ Mos 02/24/2016, 02/08/2017, 02/22/2018, 02/14/2019, 02/04/2020   PFIZER(Purple Top)SARS-COV-2 Vaccination 08/24/2019, 09/14/2019   Pneumococcal Polysaccharide-23 02/04/2020   Tdap 11/28/2018     Objective: Vital Signs: LMP 05/16/2019    Physical Exam   Musculoskeletal Exam: ***  CDAI Exam: CDAI Score: -- Patient Global: --; Provider Global: -- Swollen: --; Tender: -- Joint Exam 05/31/2021   No joint exam has been documented for this visit   There is currently no information  documented on the homunculus. Go to the Rheumatology activity and complete the homunculus joint exam.  Investigation: No additional findings.  Imaging: No results found.  Recent Labs: Lab Results  Component Value Date   WBC 8.5 08/13/2020   HGB 13.7 08/13/2020   PLT 467 (H) 08/13/2020   NA 137 08/13/2020   K 3.4 (L) 08/13/2020   CL 101 08/13/2020   CO2 27 08/13/2020   GLUCOSE 106 (H) 08/13/2020   BUN 18 08/13/2020   CREATININE 0.50 12/30/2020   BILITOT 0.2 11/16/2020   AST 18 11/16/2020   ALT 39 (H) 03/07/2021   PROT 7.5 11/16/2020   CALCIUM 10.3 08/13/2020    Speciality Comments: No specialty comments available.  Procedures:  No procedures performed Allergies: Patient has no known allergies.   Assessment / Plan:     Visit Diagnoses: No diagnosis found.  ***  Orders: No orders of the defined types were placed in this encounter.  No orders of the defined types were placed in this encounter.    Follow-Up Instructions: No follow-ups on file.   Collier Salina, MD  Note - This record has been created using [employer].  Chart creation errors have been sought, but may not always  have been located. Such creation errors do not reflect on  the standard of medical care.

## 2021-05-31 ENCOUNTER — Ambulatory Visit: Payer: 59 | Admitting: Internal Medicine

## 2021-06-10 ENCOUNTER — Other Ambulatory Visit: Payer: Self-pay | Admitting: Family Medicine

## 2021-06-13 ENCOUNTER — Other Ambulatory Visit: Payer: Self-pay | Admitting: Internal Medicine

## 2021-06-13 DIAGNOSIS — M1990 Unspecified osteoarthritis, unspecified site: Secondary | ICD-10-CM

## 2021-06-15 ENCOUNTER — Telehealth: Payer: Self-pay | Admitting: Internal Medicine

## 2021-06-15 NOTE — Telephone Encounter (Signed)
Patient called the office stating her pharmacy reached out to [place] to refill Sulfasalazine and it was denied. Patient wants to know why it was denied. Patient states she needs it refilled. [facility] on [address]

## 2021-06-15 NOTE — Telephone Encounter (Signed)
Patient advised she missed her appointment on 05/31/2021. Patient advised she is due for an office visit and to update labs. Patient advised she will need to have this done so we may refill her prescription. 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 Patient will call back to schedule an appointment.

## 2021-06-16 ENCOUNTER — Other Ambulatory Visit: Payer: Self-pay | Admitting: Family Medicine

## 2021-06-17 ENCOUNTER — Other Ambulatory Visit: Payer: Self-pay | Admitting: Internal Medicine

## 2021-06-17 DIAGNOSIS — M1990 Unspecified osteoarthritis, unspecified site: Secondary | ICD-10-CM

## 2021-06-20 NOTE — Progress Notes (Deleted)
Office Visit Note  Patient: Kristi Decker [place]             Date of Birth: 11/20/1975           MRN: [id number]             PCP: Lexine BatonWilliams, Lynne B, PA-C Referring: Lexine BatonWilliams, Lynne B, PA-C Visit Date: 06/21/2021   Subjective:  No chief complaint on file.   History of Present Illness: Kristi Decker is a 46 y.o. female here for follow up for chronic joint pain with seronegative RA on sulfasalazine 1000 mg BID and diclofenac 75 mg.***   Previous HPI 11/16/20 Kristi Decker is a 46 y.o. female here for chronic polyarticular inflammatory joint pain.  She was previously diagnosed with seronegative rheumatoid arthritis since about 2020 with previous treatment on oral diclofenac and sulfasalazine with apparently a significant response to treatment.  She also has chronic degenerative arthritis of multiple areas including the back for which she is undergone ESI and her left knee that underwent total knee arthroplasty with Dr. August Saucerean earlier this year with a very great improvement in joint pain symptoms.  She discontinued her medication perioperatively and felt that her all over joint pains worsened very noticeably with treatment interruption.  She does also have history for suspected fibromyalgia contributing to generalized joint pains as well as associated symptoms of chronic fatigue and unrefreshing sleep, exertion intolerance worsening symptoms for a day longer afterwards, episodic all over pain that feels like having the flu, previous migraine headaches that have improved, and frequent feeling of cognitive impairment or difficulty.    No Rheumatology ROS completed.   PMFS History:  Patient Active Problem List   Diagnosis Date Noted   Moderate persistent asthma without complication 09/17/2020   Arthritis of left knee    S/P total knee arthroplasty, left 08/17/2020   Inflammatory arthritis 10/10/2019   GAD (generalized anxiety disorder) 06/28/2018   Primary osteoarthritis of left knee 04/17/2018    Prediabetes 12/03/2017   Irritable bowel 03/19/2017   GERD without esophagitis 03/19/2017   Fibromyalgia 11/28/2016   Migraine without aura and without status migrainosus, not intractable 10/17/2016   Essential hypertension 10/17/2016    Past Medical History:  Diagnosis Date   Anemia    Anxiety    Arthritis    left knee   Asthma    COPD (chronic obstructive pulmonary disease) (HCC)    Depression    GERD (gastroesophageal reflux disease)    High cholesterol    Per patient   Hypertension    Neuromuscular disorder (HCC)    right arm   PTSD (post-traumatic stress disorder)    Per patient    Family History  Problem Relation Age of Onset   Rheum arthritis Mother    Hypertension Mother    Depression Mother    Anxiety disorder Mother    Bipolar disorder Father    Mental illness Sister    Depression Sister    Congestive Heart Failure Maternal Grandfather    Past Surgical History:  Procedure Laterality Date   DILATION AND CURETTAGE OF UTERUS     2002   TOTAL KNEE ARTHROPLASTY Left 08/17/2020   Procedure: LEFT TOTAL KNEE ARTHROPLASTY;  Surgeon: Cammy Copaean, Gregory Scott, MD;  Location: [facility];  Service: Orthopedics;  Laterality: Left;   UPPER GI ENDOSCOPY     WISDOM TOOTH EXTRACTION     Social History   Social History Narrative   Not on file   Immunization History  Administered Date(s) Administered  Influenza,inj,Quad PF,6+ Mos 02/24/2016, 02/08/2017, 02/22/2018, 02/14/2019, 02/04/2020   PFIZER(Purple Top)SARS-COV-2 Vaccination 08/24/2019, 09/14/2019   Pneumococcal Polysaccharide-23 02/04/2020   Tdap 11/28/2018     Objective: Vital Signs: LMP 05/16/2019    Physical Exam   Musculoskeletal Exam: ***  CDAI Exam: CDAI Score: -- Patient Global: --; Provider Global: -- Swollen: --; Tender: -- Joint Exam 06/21/2021   No joint exam has been documented for this visit   There is currently no information documented on the homunculus. Go to the Rheumatology activity and  complete the homunculus joint exam.  Investigation: No additional findings.  Imaging: No results found.  Recent Labs: Lab Results  Component Value Date   WBC 8.5 08/13/2020   HGB 13.7 08/13/2020   PLT 467 (H) 08/13/2020   NA 137 08/13/2020   K 3.4 (L) 08/13/2020   CL 101 08/13/2020   CO2 27 08/13/2020   GLUCOSE 106 (H) 08/13/2020   BUN 18 08/13/2020   CREATININE 0.50 12/30/2020   BILITOT 0.2 11/16/2020   AST 18 11/16/2020   ALT 39 (H) 03/07/2021   PROT 7.5 11/16/2020   CALCIUM 10.3 08/13/2020    Speciality Comments: No specialty comments available.  Procedures:  No procedures performed Allergies: Patient has no known allergies.   Assessment / Plan:     Visit Diagnoses: No diagnosis found.  ***  Orders: No orders of the defined types were placed in this encounter.  No orders of the defined types were placed in this encounter.    Follow-Up Instructions: No follow-ups on file.   Collier Salina, MD  Note - This record has been created using [employer].  Chart creation errors have been sought, but may not always  have been located. Such creation errors do not reflect on  the standard of medical care.

## 2021-06-21 ENCOUNTER — Ambulatory Visit: Payer: 59 | Admitting: Internal Medicine

## 2021-06-21 ENCOUNTER — Other Ambulatory Visit: Payer: Self-pay | Admitting: Internal Medicine

## 2021-06-21 DIAGNOSIS — M1990 Unspecified osteoarthritis, unspecified site: Secondary | ICD-10-CM

## 2021-06-21 MED ORDER — SULFASALAZINE 500 MG PO TABS: 1000.0000 mg | ORAL_TABLET | Freq: Two times a day (BID) | ORAL | 0 refills | Status: DC

## 2021-06-21 NOTE — Telephone Encounter (Signed)
Next Visit: 07/05/2021   Last Visit: 11/16/2020   Last Fill: 01/17/2021   DX: Inflammatory arthritis    Current Dose per office note 11/16/2020: sulfasalazine 1000 mg twice daily   Labs: 12/06/2020 BUN/Creatinine Ratio 31, Calcium 10.3, Phosphorus 4.6  Okay to refill SSZ?

## 2021-06-21 NOTE — Telephone Encounter (Signed)
Patient called the office back to reschedule appointment due to provider being out of office. Patient stated that she was being seen today because she needed a refill of Sulfasalazine 500mg . Patient requests a refill so she can make it until her next appointment.

## 2021-06-23 ENCOUNTER — Ambulatory Visit (INDEPENDENT_AMBULATORY_CARE_PROVIDER_SITE_OTHER): Payer: 59

## 2021-06-23 ENCOUNTER — Other Ambulatory Visit: Payer: Self-pay | Admitting: Internal Medicine

## 2021-06-23 ENCOUNTER — Other Ambulatory Visit: Payer: Self-pay

## 2021-06-23 ENCOUNTER — Telehealth: Payer: Self-pay | Admitting: *Deleted

## 2021-06-23 ENCOUNTER — Encounter: Payer: Self-pay | Admitting: Internal Medicine

## 2021-06-23 ENCOUNTER — Ambulatory Visit (INDEPENDENT_AMBULATORY_CARE_PROVIDER_SITE_OTHER): Payer: 59 | Admitting: Orthopedic Surgery

## 2021-06-23 DIAGNOSIS — M545 Low back pain, unspecified: Secondary | ICD-10-CM

## 2021-06-23 DIAGNOSIS — M1990 Unspecified osteoarthritis, unspecified site: Secondary | ICD-10-CM

## 2021-06-23 MED ORDER — PREDNISONE 5 MG (21) PO TBPK: ORAL_TABLET | ORAL | 0 refills | Status: DC

## 2021-06-23 MED ORDER — ACETAMINOPHEN-CODEINE #3 300-30 MG PO TABS
ORAL_TABLET | ORAL | 0 refills | Status: DC
Start: 2021-06-23 — End: 2021-08-02

## 2021-06-23 MED ORDER — METHOCARBAMOL 500 MG PO TABS: 500.0000 mg | ORAL_TABLET | Freq: Three times a day (TID) | ORAL | 0 refills | Status: DC | PRN

## 2021-06-23 NOTE — Telephone Encounter (Signed)
Patient here now!

## 2021-06-23 NOTE — Telephone Encounter (Signed)
Pt called crying upset, she went to change baby diaper and when she turned her back she felt excruciating pain- not sure if its her sciatica, and can barely move now. Wants to know if she can get worked in today?

## 2021-06-24 ENCOUNTER — Encounter: Payer: Self-pay | Admitting: Orthopedic Surgery

## 2021-06-24 NOTE — Progress Notes (Signed)
Office Visit Note   Patient: Kristi Decker           Date of Birth: 03/05/1976           MRN: [id number] Visit Date: 06/23/2021 Requested by: Irene Pap, PA-C [address] [place],  [place] [address] PCP: Marcellina Millin  Subjective: Chief Complaint  Patient presents with   Lower Back - Pain    HPI: Kristi Decker is a 46 year old patient with back pain.  Acute onset today.  She was turning her body to get something off the table and had very acute onset of pain.  Localizes the pain to the left side.  This has happened 1 time before and it required medication.  The pain is really staying local to the low back region with no radiation and no numbness and tingling.  Last episode occurred several years ago.  Patient has been taking Tylenol and Advil for the problem.              ROS: All systems reviewed are negative as they relate to the chief complaint within the history of present illness.  Patient denies  fevers or chills.   Assessment & Plan: Visit Diagnoses:  1. Low back pain, unspecified back pain laterality, unspecified chronicity, unspecified whether sciatica present     Plan: Impression is low back pain with significant scoliosis on plain radiographs but no acute bony pathology.  Plan is Medrol Dosepak muscle relaxers and Tylenol 3.  If her symptoms do not improve with conservative measures we will proceed with scanning and injection.  Follow-up as needed.  Her knee replacement is doing well.  Follow-Up Instructions: Return if symptoms worsen or fail to improve.   Orders:  Orders Placed This Encounter  Procedures   XR Lumbar Spine 2-3 Views   Meds ordered this encounter  Medications   predniSONE (STERAPRED UNI-PAK 21 TAB) 5 MG (21) TBPK tablet    Sig: Take dosepak as directed    Dispense:  21 tablet    Refill:  0   methocarbamol (ROBAXIN) 500 MG tablet    Sig: Take 1 tablet (500 mg total) by mouth every 8 (eight) hours as needed for muscle spasms.     Dispense:  30 tablet    Refill:  0   acetaminophen-codeine (TYLENOL #3) 300-30 MG tablet    Sig: 1 po bid prn pain    Dispense:  30 tablet    Refill:  0      Procedures: No procedures performed   Clinical Data: No additional findings.  Objective: Vital Signs: LMP 05/16/2019   Physical Exam:   Constitutional: Patient appears well-developed HEENT:  Head: Normocephalic Eyes:EOM are normal Neck: Normal range of motion Cardiovascular: Normal rate Pulmonary/chest: Effort normal Neurologic: Patient is alert Skin: Skin is warm Psychiatric: Patient has normal mood and affect   Ortho Exam: Ortho exam demonstrates full active and passive range of motion of the hips.  Mild pain with forward and lateral bending.  Pain localizes mostly to the proximal aspect of the left SI joint.  No trochanteric tenderness is noted.  Patient has 5 out of 5 ankle dorsiflexion plantarflexion quad hamstring 3 with no paresthesias in the L1-S1 distribution.  Specialty Comments:  No specialty comments available.  Imaging: No results found.   PMFS History: Patient Active Problem List   Diagnosis Date Noted   Moderate persistent asthma without complication [id number]   Arthritis of left knee    S/P total knee arthroplasty, left  08/17/2020   Inflammatory arthritis 10/10/2019   GAD (generalized anxiety disorder) 06/28/2018   Primary osteoarthritis of left knee 04/17/2018   Prediabetes 12/03/2017   Irritable bowel 03/19/2017   GERD without esophagitis 03/19/2017   Fibromyalgia 11/28/2016   Migraine without aura and without status migrainosus, not intractable 10/17/2016   Essential hypertension 10/17/2016   Past Medical History:  Diagnosis Date   Anemia    Anxiety    Arthritis    left knee   Asthma    COPD (chronic obstructive pulmonary disease) (HCC)    Depression    GERD (gastroesophageal reflux disease)    High cholesterol    Per patient   Hypertension    Neuromuscular disorder  ([facility])    right arm   PTSD (post-traumatic stress disorder)    Per patient    Family History  Problem Relation Age of Onset   Rheum arthritis Mother    Hypertension Mother    Depression Mother    Anxiety disorder Mother    Bipolar disorder Father    Mental illness Sister    Depression Sister    Congestive Heart Failure Maternal Grandfather     Past Surgical History:  Procedure Laterality Date   DILATION AND CURETTAGE OF UTERUS     2002   TOTAL KNEE ARTHROPLASTY Left 08/17/2020   Procedure: LEFT TOTAL KNEE ARTHROPLASTY;  Surgeon: Meredith Pel, MD;  Location: [facility];  Service: Orthopedics;  Laterality: Left;   UPPER GI ENDOSCOPY     WISDOM TOOTH EXTRACTION     Social History   Occupational History   Not on file  Tobacco Use   Smoking status: Never   Smokeless tobacco: Never  Vaping Use   Vaping Use: Never used  Substance and Sexual Activity   Alcohol use: Yes    Comment: Occasionally   Drug use: Never   Sexual activity: Not on file

## 2021-07-04 NOTE — Progress Notes (Signed)
Office Visit Note  Patient: Curtistine Poppa             Date of Birth: [birth year]           MRN: [id number]             PCP: Lexine Baton Referring: Lexine Baton Visit Date: 07/05/2021   Subjective:  Follow-up (Doing good)   History of Present Illness: Kristi Decker is a 46 y.o. 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 She still has a lot of joint pain bothering her especially in her back. She is frustrated by persistent of pain and stiffness limiting physical activities. She does feel there is ongoing benefit with sulfasalazine but not helping her back very much. She saw Dr. August Saucer for this problem about 2 weeks ago on 06/23/21 trying conservative treatment with tylenol 3 and robaxin but has not felt a good relief with this so far.  Previous HPI 11/16/20 Margaux Keitz is a 46 y.o. female here for chronic polyarticular inflammatory joint pain.  She was previously diagnosed with seronegative rheumatoid arthritis since about 2020 with previous treatment on oral diclofenac and sulfasalazine with apparently a significant response to treatment.  She also has chronic degenerative arthritis of multiple areas including the back for which she is undergone ESI and her left knee that underwent total knee arthroplasty with Dr. August Saucer earlier this year with a very great improvement in joint pain symptoms.  She discontinued her medication perioperatively and felt that her all over joint pains worsened very noticeably with treatment interruption.  She does also have history for suspected fibromyalgia contributing to generalized joint pains as well as associated symptoms of chronic fatigue and unrefreshing sleep, exertion intolerance worsening symptoms for a day longer afterwards, episodic all over pain that feels like having the flu, previous migraine headaches that have improved, and frequent feeling of cognitive impairment or  difficulty.    Review of Systems  Constitutional:  Positive for fatigue.  HENT:  Positive for mouth dryness.   Eyes:  Negative for dryness.  Respiratory:  Negative for shortness of breath.   Cardiovascular:  Negative for swelling in legs/feet.  Gastrointestinal:  Negative for constipation.  Endocrine: Negative for excessive thirst.  Genitourinary:  Negative for difficulty urinating.  Musculoskeletal:  Positive for joint pain, joint pain and morning stiffness.  Skin:  Negative for rash.  Allergic/Immunologic: Negative for susceptible to infections.  Neurological:  Negative for weakness.  Hematological:  Negative for bruising/bleeding tendency.  Psychiatric/Behavioral:  Negative for sleep disturbance.    PMFS History:  Patient Active Problem List   Diagnosis Date Noted   High risk medication use 07/05/2021   Moderate persistent asthma without complication 09/17/2020   S/P total knee arthroplasty, left 08/17/2020   Inflammatory arthritis 10/10/2019   GAD (generalized anxiety disorder) 06/28/2018   Primary osteoarthritis of left knee 04/17/2018   Prediabetes 12/03/2017   Irritable bowel 03/19/2017   GERD without esophagitis 03/19/2017   Fibromyalgia 11/28/2016   Migraine without aura and without status migrainosus, not intractable 10/17/2016   Essential hypertension 10/17/2016    Past Medical History:  Diagnosis Date   Anemia    Anxiety    Arthritis    left knee   Asthma    COPD (chronic obstructive pulmonary disease) (HCC)    Depression    GERD (gastroesophageal reflux disease)    High cholesterol    Per patient  Hypertension    Neuromuscular disorder (HCC)    right arm   PTSD (post-traumatic stress disorder)    Per patient    Family History  Problem Relation Age of Onset   Rheum arthritis Mother    Hypertension Mother    Depression Mother    Anxiety disorder Mother    Bipolar disorder Father    Mental illness Sister    Depression Sister    Congestive Heart  Failure Maternal Grandfather    Past Surgical History:  Procedure Laterality Date   DILATION AND CURETTAGE OF UTERUS     2002   TOTAL KNEE ARTHROPLASTY Left 08/17/2020   Procedure: LEFT TOTAL KNEE ARTHROPLASTY;  Surgeon: Cammy Copa, MD;  Location: [facility];  Service: Orthopedics;  Laterality: Left;   UPPER GI ENDOSCOPY     WISDOM TOOTH EXTRACTION     Social History   Social History Narrative   Not on file   Immunization History  Administered Date(s) Administered   Influenza,inj,Quad PF,6+ Mos 02/24/2016, 02/08/2017, 02/22/2018, 02/14/2019, 02/04/2020   PFIZER(Purple Top)SARS-COV-2 Vaccination 08/24/2019, 09/14/2019   Pneumococcal Polysaccharide-23 02/04/2020   Tdap 11/28/2018     Objective: Vital Signs: BP 115/77 (BP Location: Left Arm, Patient Position: Sitting, Cuff Size: Normal)    Pulse 91    Resp 15    Ht 5\' 6"  (1.676 m)    Wt 205 lb (93 kg)    LMP 05/16/2019    BMI 33.09 kg/m    Physical Exam Constitutional:      Appearance: She is obese.  Cardiovascular:     Rate and Rhythm: Normal rate and regular rhythm.  Pulmonary:     Effort: Pulmonary effort is normal.     Breath sounds: Normal breath sounds.  Musculoskeletal:     Right lower leg: No edema.     Left lower leg: No edema.  Skin:    General: Skin is warm and dry.     Findings: No rash.  Neurological:     General: No focal deficit present.     Mental Status: She is alert.     Deep Tendon Reflexes: Reflexes normal.     Musculoskeletal Exam:  Neck full ROM mild lateral and base paraspinal muscle tenderness Shoulders full ROM no tenderness or swelling Elbows full ROM no tenderness or swelling Wrists full ROM no tenderness or swelling Fingers full ROM no tenderness or swelling Paraspinal muscle tenderness in low back worse on left and above iliac crest, no radiation Knees full ROM no tenderness or swelling Ankles full ROM no tenderness or swelling   Investigation: No additional  findings.  Imaging: XR Lumbar Spine 2-3 Views  Result Date: 06/24/2021 AP lateral radiographs lumbar spine reviewed.  Scoliosis is present.  No acute fracture.  Significant joint space narrowing noted in the regions of maximal curvature.  This includes both the facet joints as well as disc spaces.  No spondylolisthesis.   Recent Labs: Lab Results  Component Value Date   WBC 6.4 07/05/2021   HGB 12.8 07/05/2021   PLT 355 07/05/2021   NA 137 07/05/2021   K 3.8 07/05/2021   CL 102 07/05/2021   CO2 27 07/05/2021   GLUCOSE 122 (H) 07/05/2021   BUN 17 07/05/2021   CREATININE 0.58 07/05/2021   BILITOT 0.4 07/05/2021   AST 24 07/05/2021   ALT 35 (H) 07/05/2021   PROT 6.8 07/05/2021   CALCIUM 9.6 07/05/2021    Speciality Comments: No specialty comments available.  Procedures:  No  procedures performed Allergies: Patient has no known allergies.   Assessment / Plan:     Visit Diagnoses: Inflammatory arthritis - Plan: Sedimentation rate  Checking sed rate today for disease activity monitoring. I don't know that her back pain is really inflammatory arthritis location more around facet joint or into paraspinal muscles. Plan to continue SSZ 1000 mg BID she is having a lot of trouble so not a good opportunity to try tapering. I will contact Dr. August Saucer since patient is reporting not adequate benefit with the short term medications, might be a candidate for injection treatment. She did not take the oral prednisone feels she does not tolerate this medication well.  Primary osteoarthritis of left knee  Currently doing pretty well, no active inflammation.  Fibromyalgia  On the cymbalta at stable dose back pain and fatigue are currently flared up although the back localizes enough to suspect more of underlying pain there.  High risk medication use - Plan: CBC with Differential/Platelet, COMPLETE METABOLIC PANEL WITH GFR  Checking CBC and CMP for medication monitoring on continued  sulfasalazine. She needs to follow up more closely going forwards or at least lab monitoring.  Orders: Orders Placed This Encounter  Procedures   Sedimentation rate   CBC with Differential/Platelet   COMPLETE METABOLIC PANEL WITH GFR   No orders of the defined types were placed in this encounter.    Follow-Up Instructions: Return in about 3 months (around 10/02/2021) for OA/FMS/?RA on SSZ/NSAID f/u 3 mos.   Fuller Plan, MD  Note - This record has been created using [employer].  Chart creation errors have been sought, but may not always  have been located. Such creation errors do not reflect on  the standard of medical care.

## 2021-07-05 ENCOUNTER — Other Ambulatory Visit: Payer: Self-pay

## 2021-07-05 ENCOUNTER — Ambulatory Visit (INDEPENDENT_AMBULATORY_CARE_PROVIDER_SITE_OTHER): Payer: 59 | Admitting: Internal Medicine

## 2021-07-05 ENCOUNTER — Encounter: Payer: Self-pay | Admitting: Internal Medicine

## 2021-07-05 VITALS — BP 115/77 | HR 91 | Resp 15 | Ht 66.0 in | Wt 205.0 lb

## 2021-07-05 DIAGNOSIS — Z79899 Other long term (current) drug therapy: Secondary | ICD-10-CM | POA: Diagnosis not present

## 2021-07-05 DIAGNOSIS — M797 Fibromyalgia: Secondary | ICD-10-CM | POA: Diagnosis not present

## 2021-07-05 DIAGNOSIS — M1712 Unilateral primary osteoarthritis, left knee: Secondary | ICD-10-CM | POA: Diagnosis not present

## 2021-07-05 DIAGNOSIS — M1990 Unspecified osteoarthritis, unspecified site: Secondary | ICD-10-CM

## 2021-07-05 LAB — CBC WITH DIFFERENTIAL/PLATELET
Absolute Monocytes: 518 cells/uL (ref 200–950)
Basophils Absolute: 19 cells/uL (ref 0–200)
Basophils Relative: 0.3 %
Eosinophils Absolute: 301 cells/uL (ref 15–500)
Eosinophils Relative: 4.7 %
HCT: 39.3 % (ref 35.0–45.0)
Hemoglobin: 12.8 g/dL (ref 11.7–15.5)
Lymphs Abs: 2323 cells/uL (ref 850–3900)
MCH: 31.1 pg (ref 27.0–33.0)
MCHC: 32.6 g/dL (ref 32.0–36.0)
MCV: 95.4 fL (ref 80.0–100.0)
MPV: 9.6 fL (ref 7.5–12.5)
Monocytes Relative: 8.1 %
Neutro Abs: 3238 cells/uL (ref 1500–7800)
Neutrophils Relative %: 50.6 %
Platelets: 355 10*3/uL (ref 140–400)
RBC: 4.12 10*6/uL (ref 3.80–5.10)
RDW: 14.4 % (ref 11.0–15.0)
Total Lymphocyte: 36.3 %
WBC: 6.4 10*3/uL (ref 3.8–10.8)

## 2021-07-05 LAB — SEDIMENTATION RATE: Sed Rate: 11 mm/h (ref 0–20)

## 2021-07-05 LAB — COMPLETE METABOLIC PANEL WITH GFR
AG Ratio: 2 (calc) (ref 1.0–2.5)
ALT: 35 U/L — ABNORMAL HIGH (ref 6–29)
AST: 24 U/L (ref 10–35)
Albumin: 4.5 g/dL (ref 3.6–5.1)
Alkaline phosphatase (APISO): 146 U/L — ABNORMAL HIGH (ref 31–125)
BUN: 17 mg/dL (ref 7–25)
CO2: 27 mmol/L (ref 20–32)
Calcium: 9.6 mg/dL (ref 8.6–10.2)
Chloride: 102 mmol/L (ref 98–110)
Creat: 0.58 mg/dL (ref 0.50–0.99)
Globulin: 2.3 g/dL (calc) (ref 1.9–3.7)
Glucose, Bld: 122 mg/dL — ABNORMAL HIGH (ref 65–99)
Potassium: 3.8 mmol/L (ref 3.5–5.3)
Sodium: 137 mmol/L (ref 135–146)
Total Bilirubin: 0.4 mg/dL (ref 0.2–1.2)
Total Protein: 6.8 g/dL (ref 6.1–8.1)
eGFR: 112 mL/min/{1.73_m2} (ref >=60)

## 2021-07-06 ENCOUNTER — Encounter: Payer: Self-pay | Admitting: Internal Medicine

## 2021-07-10 ENCOUNTER — Encounter: Payer: Self-pay | Admitting: Orthopedic Surgery

## 2021-07-11 NOTE — Telephone Encounter (Signed)
Do you have any staff messages in your inbox from Dr Dimple Casey?

## 2021-07-14 NOTE — Telephone Encounter (Signed)
Sounds good to me, up to her what she wants to do then

## 2021-07-15 ENCOUNTER — Encounter (HOSPITAL_BASED_OUTPATIENT_CLINIC_OR_DEPARTMENT_OTHER): Payer: Self-pay

## 2021-07-15 ENCOUNTER — Encounter: Payer: Self-pay | Admitting: Internal Medicine

## 2021-07-15 ENCOUNTER — Other Ambulatory Visit: Payer: Self-pay | Admitting: Medical

## 2021-07-15 ENCOUNTER — Other Ambulatory Visit: Payer: Self-pay

## 2021-07-15 DIAGNOSIS — M545 Low back pain, unspecified: Secondary | ICD-10-CM

## 2021-07-15 DIAGNOSIS — E782 Mixed hyperlipidemia: Secondary | ICD-10-CM

## 2021-07-15 DIAGNOSIS — N951 Menopausal and female climacteric states: Secondary | ICD-10-CM

## 2021-07-15 MED ORDER — ESTRADIOL 0.5 MG PO TABS
0.5000 mg | ORAL_TABLET | Freq: Every day | ORAL | 11 refills | Status: DC
Start: 2021-07-15 — End: 2022-03-08

## 2021-07-15 MED ORDER — ATORVASTATIN CALCIUM 20 MG PO TABS: 20.0000 mg | ORAL_TABLET | Freq: Every day | ORAL | 1 refills | Status: DC

## 2021-07-18 NOTE — Telephone Encounter (Signed)
I spoke with [name] hre labs look fine no evidence of increased inflammatory markers. Regarding back pain also sent message to Dr. August Saucer but agree with existing plan for imaging possible local injection treatment due to symptoms refractory to her medications.

## 2021-07-19 ENCOUNTER — Other Ambulatory Visit: Payer: Self-pay | Admitting: Internal Medicine

## 2021-07-19 DIAGNOSIS — M1990 Unspecified osteoarthritis, unspecified site: Secondary | ICD-10-CM

## 2021-07-19 NOTE — Telephone Encounter (Signed)
Next Visit: Due May 2023. Message sent to the front to schedule.  ? ?Last Visit: 07/05/2021 ? ?Last Fill: 06/21/2021 ? ?DX: Inflammatory arthritis  ? ?Current Dose per office note 07/05/2021: continue SSZ 1000 mg BID  ? ?Labs: 07/05/2021 Glucose 122, alk. Phos. 146, ALT 35 ? ?Okay to refill SSZ?  ?

## 2021-07-19 NOTE — Telephone Encounter (Signed)
Please schedule patient for a follow up visit. Patient due May 2023. Thanks!  

## 2021-07-23 ENCOUNTER — Other Ambulatory Visit: Payer: Self-pay | Admitting: Orthopedic Surgery

## 2021-07-25 MED ORDER — METHOCARBAMOL 500 MG PO TABS: 500.0000 mg | ORAL_TABLET | Freq: Three times a day (TID) | ORAL | 0 refills | Status: DC | PRN

## 2021-07-29 ENCOUNTER — Ambulatory Visit (HOSPITAL_COMMUNITY): Admission: RE | Admit: 2021-07-29 | Payer: 59 | Source: Ambulatory Visit

## 2021-08-02 ENCOUNTER — Ambulatory Visit (INDEPENDENT_AMBULATORY_CARE_PROVIDER_SITE_OTHER): Payer: 59 | Admitting: Physician Assistant

## 2021-08-02 ENCOUNTER — Encounter: Payer: Self-pay | Admitting: Physician Assistant

## 2021-08-02 VITALS — BP 120/70 | HR 83 | Ht 66.0 in | Wt 202.2 lb

## 2021-08-02 DIAGNOSIS — K219 Gastro-esophageal reflux disease without esophagitis: Secondary | ICD-10-CM

## 2021-08-02 DIAGNOSIS — R7303 Prediabetes: Secondary | ICD-10-CM

## 2021-08-02 DIAGNOSIS — I10 Essential (primary) hypertension: Secondary | ICD-10-CM | POA: Diagnosis not present

## 2021-08-02 DIAGNOSIS — J4540 Moderate persistent asthma, uncomplicated: Secondary | ICD-10-CM | POA: Diagnosis not present

## 2021-08-02 DIAGNOSIS — Z1211 Encounter for screening for malignant neoplasm of colon: Secondary | ICD-10-CM

## 2021-08-02 DIAGNOSIS — Z6832 Body mass index (BMI) 32.0-32.9, adult: Secondary | ICD-10-CM | POA: Insufficient documentation

## 2021-08-02 MED ORDER — PANTOPRAZOLE SODIUM 40 MG PO TBEC: 40.0000 mg | DELAYED_RELEASE_TABLET | Freq: Every day | ORAL | 1 refills | Status: DC

## 2021-08-02 MED ORDER — LISINOPRIL-HYDROCHLOROTHIAZIDE 20-12.5 MG PO TABS: 1.0000 | ORAL_TABLET | Freq: Every day | ORAL | 1 refills | Status: DC

## 2021-08-02 NOTE — Patient Instructions (Signed)
You will get a call to schedule an appointment with Gastroenterology for a colonoscopy  ?

## 2021-08-02 NOTE — Assessment & Plan Note (Signed)
stable, continue Protonix 40 mg qd, avoid stomach irritants (tomatoes, oranges, lemons, limes, spicy/greasy food) ? ?

## 2021-08-02 NOTE — Assessment & Plan Note (Signed)
Stable, continue low sugar, low carbohydrate diet ?

## 2021-08-02 NOTE — Assessment & Plan Note (Signed)
Stable, continue low sugar, low carbohydrate diet, hgb a1c 5.7 in 11/2020 ?

## 2021-08-02 NOTE — Assessment & Plan Note (Signed)
Stable, continue albuterol and symbicort as needed ?

## 2021-08-02 NOTE — Progress Notes (Signed)
? ?Established Patient Office Visit ? ?Subjective:  ?Patient ID: Kristi Decker, female    DOB: [birth year]  Age: 46 y.o. MRN: [id number] ? ?CC:  ?Chief Complaint  ?Patient presents with  ? Medication Refill  ?  Med check. No other concerns  ? ? ?HPI ?Kristi Decker presents for a follow up appointment.  ?States she takes Seroquel to help her sleep, prescribed by her Psychiatrist, and is trying to wean off; is taking melatonin and unisom instead; states she is eating a lazy keto diet with a decreased amount of sugars and carbs; drinks about 6 - 7 glasses of water daily; goes to the the gym 2 - 3 days a week, walks 1/2 miile on treadmill, rides 1 mile on stationary bike and does some weight lifting; stays at the gym for an hour. ? ?Outpatient Medications Prior to Visit  ?Medication Sig Dispense Refill  ? albuterol (VENTOLIN HFA) 108 (90 Base) MCG/ACT inhaler Inhale 2 puffs into the lungs every 6 (six) hours as needed for wheezing or shortness of breath.    ? APPLE CIDER VINEGAR PO Take 1 capsule by mouth 2 (two) times daily.    ? atorvastatin (LIPITOR) 20 MG tablet Take 1 tablet (20 mg total) by mouth daily. 90 tablet 1  ? Calcium-Magnesium-Zinc (CAL-MAG-ZINC PO) Take 1 tablet by mouth daily.    ? cholecalciferol (VITAMIN D3) 25 MCG (1000 UNIT) tablet Take 1,000 Units by mouth daily.    ? clonazePAM (KLONOPIN) 0.5 MG tablet Take 0.5 mg by mouth 2 (two) times daily as needed for anxiety.    ? Cyanocobalamin (VITAMIN B-12 PO) Take 1 tablet by mouth daily.    ? diclofenac (VOLTAREN) 75 MG EC tablet TAKE ONE TABLET BY MOUTH TWICE A DAY 60 tablet 2  ? DULoxetine (CYMBALTA) 60 MG capsule Take 120 mg by mouth daily.    ? estradiol (ESTRACE) 0.5 MG tablet Take 1 tablet (0.5 mg total) by mouth daily. 30 tablet 11  ? ferrous sulfate 325 (65 FE) MG tablet Take 325 mg by mouth daily.    ? lamoTRIgine (LAMICTAL) 25 MG tablet Take 75 mg by mouth daily.    ? methocarbamol (ROBAXIN) 500 MG tablet Take 1 tablet (500 mg total) by mouth  every 8 (eight) hours as needed for muscle spasms. 30 tablet 0  ? progesterone (PROMETRIUM) 100 MG capsule Take 1 capsule (100 mg total) by mouth at bedtime. 30 capsule 12  ? QUEtiapine (SEROQUEL XR) 200 MG 24 hr tablet Take 200 mg by mouth at bedtime.    ? sulfaSALAzine (AZULFIDINE) 500 MG tablet TAKE TWO TABLETS BY MOUTH TWICE A DAY 120 tablet 2  ? SYMBICORT 160-4.5 MCG/ACT inhaler INHALE 2 PUFFS INTO THE LUNGS TWO TIMES A DAY AS NEEDED FOR SHORTNESS OF BREATH/WHEEZING 10.2 g 5  ? lisinopril-hydrochlorothiazide (ZESTORETIC) 20-12.5 MG tablet TAKE ONE TABLET BY MOUTH DAILY 30 tablet 1  ? pantoprazole (PROTONIX) 40 MG tablet TAKE ONE TABLET BY MOUTH DAILY 90 tablet 0  ? acetaminophen-codeine (TYLENOL #3) 300-30 MG tablet 1 po bid prn pain 30 tablet 0  ? ?No facility-administered medications prior to visit.  ? ? ?No Known Allergies ? ?ROS ?Review of Systems  ?Constitutional:  Negative for activity change and chills.  ?HENT:  Negative for congestion and voice change.   ?Eyes:  Negative for pain and redness.  ?Respiratory:  Negative for cough and wheezing.   ?Cardiovascular:  Negative for chest pain.  ?Gastrointestinal:  Negative for constipation, diarrhea, nausea and vomiting.  ?  Endocrine: Negative for polyuria.  ?Genitourinary:  Negative for frequency.  ?Skin:  Negative for color change and rash.  ?Allergic/Immunologic: Negative for immunocompromised state.  ?Neurological:  Negative for dizziness.  ?Psychiatric/Behavioral:  Positive for sleep disturbance. Negative for agitation.   ? ?  ?Objective:  ?  ?Physical Exam ?Vitals and nursing note reviewed.  ?Constitutional:   ?   General: She is not in acute distress. ?   Appearance: She is normal weight. She is not ill-appearing.  ?HENT:  ?   Head: Normocephalic and atraumatic.  ?   Right Ear: External ear normal.  ?   Left Ear: External ear normal.  ?Eyes:  ?   Extraocular Movements: Extraocular movements intact.  ?   Conjunctiva/sclera: Conjunctivae normal.  ?   Pupils:  Pupils are equal, round, and reactive to light.  ?Cardiovascular:  ?   Rate and Rhythm: Normal rate and regular rhythm.  ?Pulmonary:  ?   Effort: Pulmonary effort is normal.  ?   Breath sounds: Normal breath sounds. No wheezing.  ?Abdominal:  ?   General: Bowel sounds are normal.  ?   Palpations: Abdomen is soft.  ?Musculoskeletal:     ?   General: Normal range of motion.  ?   Cervical back: Normal range of motion.  ?Skin: ?   General: Skin is warm and dry.  ?Neurological:  ?   Mental Status: She is alert and oriented to person, place, and time.  ?Psychiatric:     ?   Mood and Affect: Mood normal.  ? ? ?BP 120/70   Pulse 83   Wt 202 lb 3.2 oz (91.7 kg)   LMP 05/16/2019   SpO2 93%   BMI 32.64 kg/m?  ? ?Wt Readings from Last 3 Encounters:  ?08/02/21 202 lb 3.2 oz (91.7 kg)  ?07/05/21 205 lb (93 kg)  ?05/20/21 208 lb (94.3 kg)  ? ? ?Results for orders placed or performed in visit on 07/05/21  ?Sedimentation rate  ?Result Value Ref Range  ? Sed Rate 11 0 - 20 mm/h  ?CBC with Differential/Platelet  ?Result Value Ref Range  ? WBC 6.4 3.8 - 10.8 Thousand/uL  ? RBC 4.12 3.80 - 5.10 Million/uL  ? Hemoglobin 12.8 11.7 - 15.5 g/dL  ? HCT 39.3 35.0 - 45.0 %  ? MCV 95.4 80.0 - 100.0 fL  ? MCH 31.1 27.0 - 33.0 pg  ? MCHC 32.6 32.0 - 36.0 g/dL  ? RDW 14.4 11.0 - 15.0 %  ? Platelets 355 140 - 400 Thousand/uL  ? MPV 9.6 7.5 - 12.5 fL  ? Neutro Abs 3,238 1,500 - 7,800 cells/uL  ? Lymphs Abs 2,323 850 - 3,900 cells/uL  ? Absolute Monocytes 518 200 - 950 cells/uL  ? Eosinophils Absolute 301 15 - 500 cells/uL  ? Basophils Absolute 19 0 - 200 cells/uL  ? Neutrophils Relative % 50.6 %  ? Total Lymphocyte 36.3 %  ? Monocytes Relative 8.1 %  ? Eosinophils Relative 4.7 %  ? Basophils Relative 0.3 %  ?COMPLETE METABOLIC PANEL WITH GFR  ?Result Value Ref Range  ? Glucose, Bld 122 (H) 65 - 99 mg/dL  ? BUN 17 7 - 25 mg/dL  ? Creat 0.58 0.50 - 0.99 mg/dL  ? eGFR 112 > OR = 60 mL/min/1.31m  ? BUN/Creatinine Ratio NOT APPLICABLE 6 - 22 (calc)   ? Sodium 137 135 - 146 mmol/L  ? Potassium 3.8 3.5 - 5.3 mmol/L  ? Chloride 102 98 - 110 mmol/L  ?  CO2 27 20 - 32 mmol/L  ? Calcium 9.6 8.6 - 10.2 mg/dL  ? Total Protein 6.8 6.1 - 8.1 g/dL  ? Albumin 4.5 3.6 - 5.1 g/dL  ? Globulin 2.3 1.9 - 3.7 g/dL (calc)  ? AG Ratio 2.0 1.0 - 2.5 (calc)  ? Total Bilirubin 0.4 0.2 - 1.2 mg/dL  ? Alkaline phosphatase (APISO) 146 (H) 31 - 125 U/L  ? AST 24 10 - 35 U/L  ? ALT 35 (H) 6 - 29 U/L  ?  ? ?Last metabolic panel ?Lab Results  ?Component Value Date  ? GLUCOSE 122 (H) 07/05/2021  ? NA 137 07/05/2021  ? K 3.8 07/05/2021  ? CL 102 07/05/2021  ? CO2 27 07/05/2021  ? BUN 17 07/05/2021  ? CREATININE 0.58 07/05/2021  ? EGFR 112 07/05/2021  ? CALCIUM 9.6 07/05/2021  ? PROT 6.8 07/05/2021  ? BILITOT 0.4 07/05/2021  ? AST 24 07/05/2021  ? ALT 35 (H) 07/05/2021  ? ANIONGAP 9 08/13/2020  ? ?Last hemoglobin A1c ?No results found for: HGBA1C ?  ? ?The 10-year ASCVD risk score (Arnett DK, et al., 2019) is: 1.6% ? ?  ?Assessment & Plan:  ? ?Problem List Items Addressed This Visit   ? ?  ? Cardiovascular and Mediastinum  ? Essential hypertension - Primary  ? Relevant Medications  ? lisinopril-hydrochlorothiazide (ZESTORETIC) 20-12.5 MG tablet  ?  ? Respiratory  ? Moderate persistent asthma without complication  ?  Stable, continue albuterol and symbicort as needed ?  ?  ?  ? Digestive  ? GERD without esophagitis  ?  stable, continue Protonix 40 mg qd, avoid stomach irritants (tomatoes, oranges, lemons, limes, spicy/greasy food) ? ?  ?  ? Relevant Medications  ? pantoprazole (PROTONIX) 40 MG tablet  ?  ? Other  ? Prediabetes  ?  Stable, continue low sugar, low carbohydrate diet, hgb a1c 5.7 in 11/2020 ?  ?  ? Body mass index (BMI) of 32.0 to 32.9 in adult  ?  Stable, continue low sugar, low carbohydrate diet ?  ?  ? ?Other Visit Diagnoses   ? ? Screen for colon cancer      ? Relevant Orders  ? Ambulatory referral to Gastroenterology  ? ?  ? ? ?Meds ordered this encounter  ?Medications  ?  lisinopril-hydrochlorothiazide (ZESTORETIC) 20-12.5 MG tablet  ?  Sig: Take 1 tablet by mouth daily.  ?  Dispense:  90 tablet  ?  Refill:  1  ?  Order Specific Question:   Supervising Provider  ?  Answer:   L

## 2021-08-05 ENCOUNTER — Telehealth: Payer: Self-pay | Admitting: *Deleted

## 2021-08-05 NOTE — Telephone Encounter (Signed)
No return call received no show letter sent via my chart and mail,procedure cancelled. ?

## 2021-08-05 NOTE — Telephone Encounter (Signed)
Attempted to call x2 message left with call back number to call by 5 pm to reschedule pre-visit or upcoming procedure will be cancelled. ?

## 2021-08-09 ENCOUNTER — Ambulatory Visit (HOSPITAL_COMMUNITY)
Admission: RE | Admit: 2021-08-09 | Discharge: 2021-08-09 | Disposition: A | Discharge status: Home / Self Care | Payer: 59 | Source: Ambulatory Visit | Attending: Surgical | Admitting: Surgical

## 2021-08-09 ENCOUNTER — Other Ambulatory Visit: Payer: Self-pay

## 2021-08-09 DIAGNOSIS — M5136 Other intervertebral disc degeneration, lumbar region: Secondary | ICD-10-CM | POA: Insufficient documentation

## 2021-08-09 DIAGNOSIS — M48061 Spinal stenosis, lumbar region without neurogenic claudication: Secondary | ICD-10-CM | POA: Diagnosis not present

## 2021-08-09 DIAGNOSIS — M4186 Other forms of scoliosis, lumbar region: Secondary | ICD-10-CM | POA: Diagnosis not present

## 2021-08-09 DIAGNOSIS — M545 Low back pain, unspecified: Secondary | ICD-10-CM | POA: Insufficient documentation

## 2021-08-09 IMAGING — MR MR LUMBAR SPINE W/O CM
4 of 5 series · 17 of 48 positions shown · non-contrast
Comparison: X-ray lumbar [DATE] [DATE].

CLINICAL DATA: Low back pain; unspecified back pain laterality,
unspecified chronicity, unspecified whether sciatica present.
History of scoliosis.

EXAM:
MRI LUMBAR SPINE WITHOUT CONTRAST
TECHNIQUE: Multiplanar, multisequence MR imaging of the lumbar spine was
performed. No intravenous contrast was administered.

[Series 4: T2 · sagittal · 4.0mm · 0.55mm/px · 6 of 16 slices shown (1 of 2)]
[im 1/16]
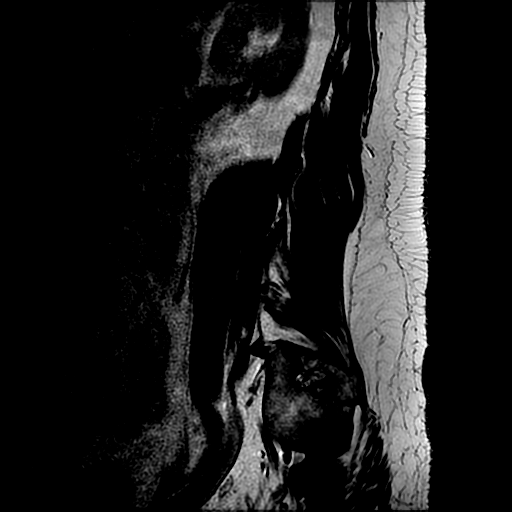
[im 4/16]
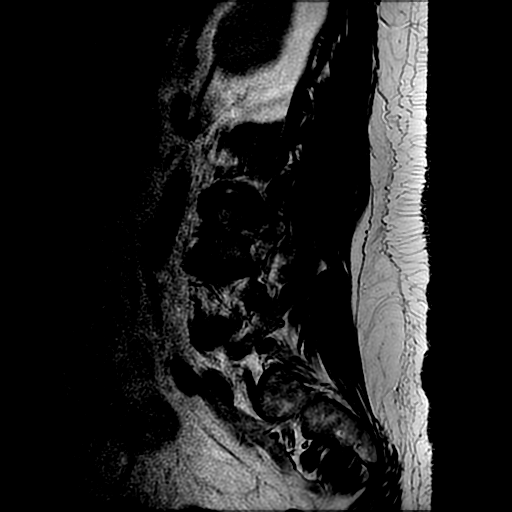
[im 7/16]
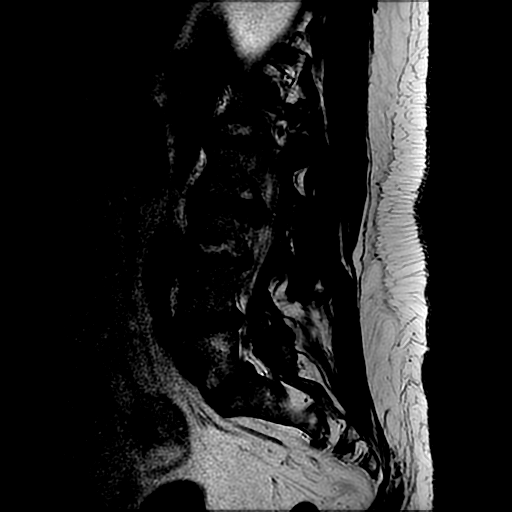
[im 10/16]
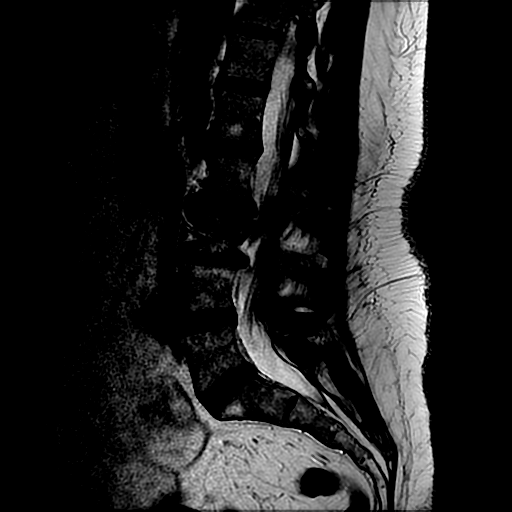
[im 13/16]
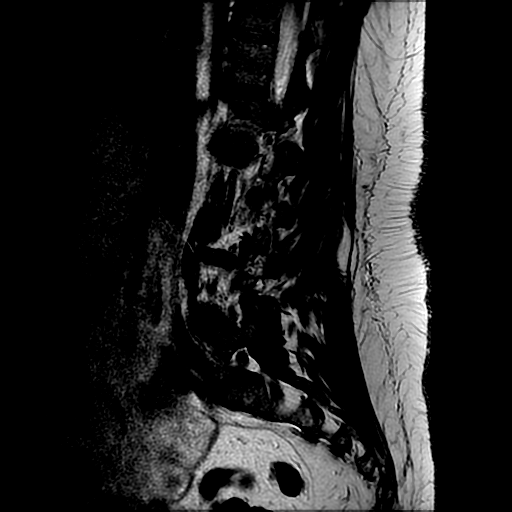
[im 16/16]
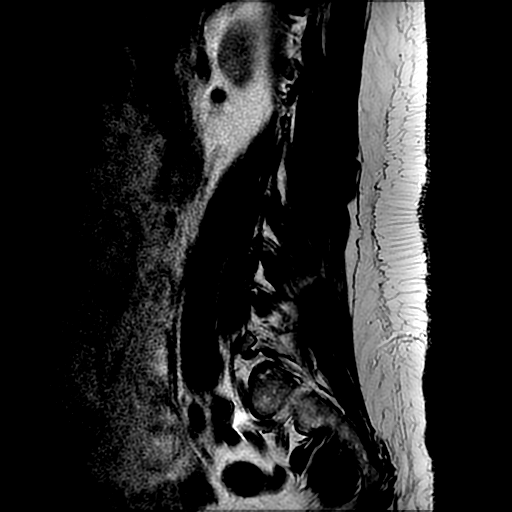

[Series 6: T1 · sagittal · 4.0mm · 0.55mm/px · 3 of 16 slices shown (1 of 2)]
[im 4/16]
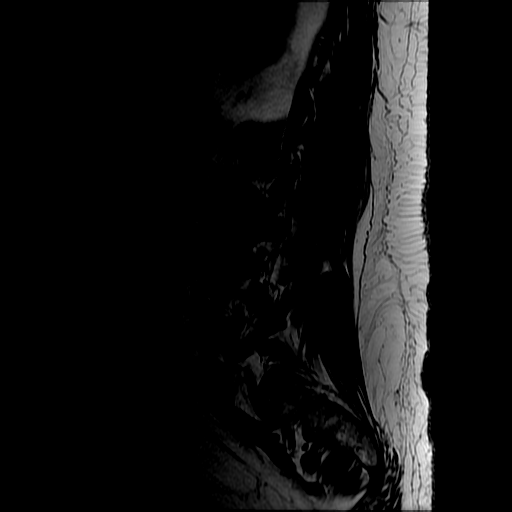
[im 10/16]
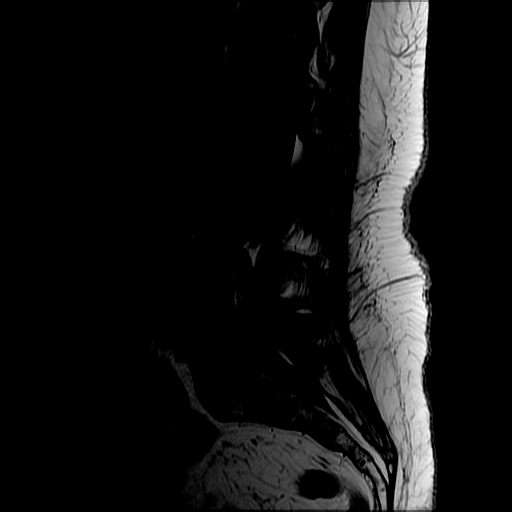
[im 16/16]
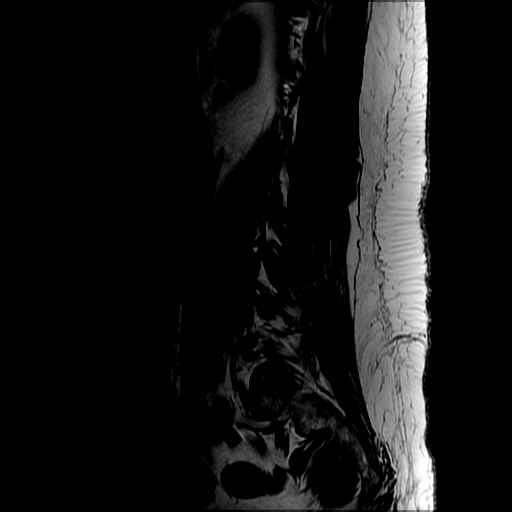

[Series 7: T2 · axial · 4.0mm · 0.39mm/px · z∈[-34,+122]mm · 5 of 37 slices shown (2 of 2)]
[im 1/37]
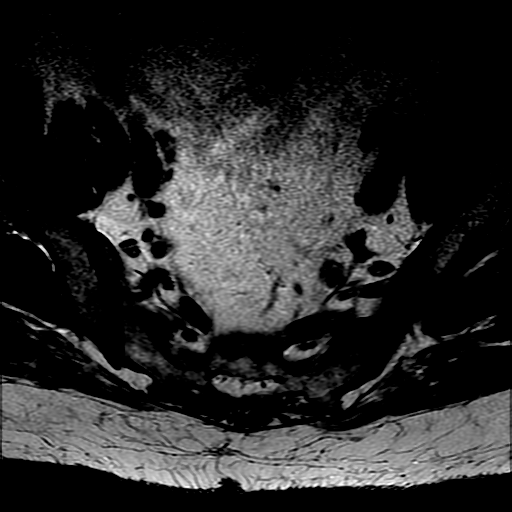
[im 6/37]
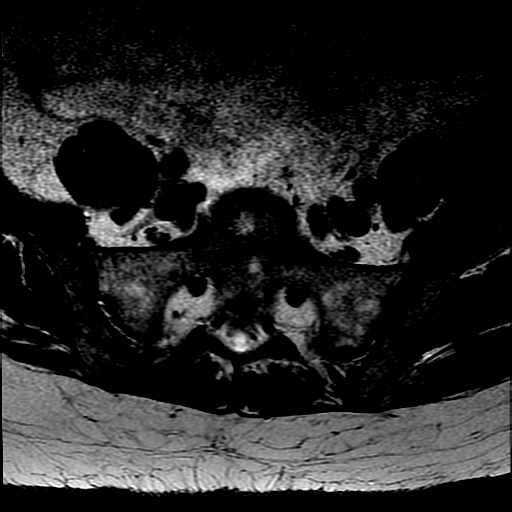
[im 11/37]
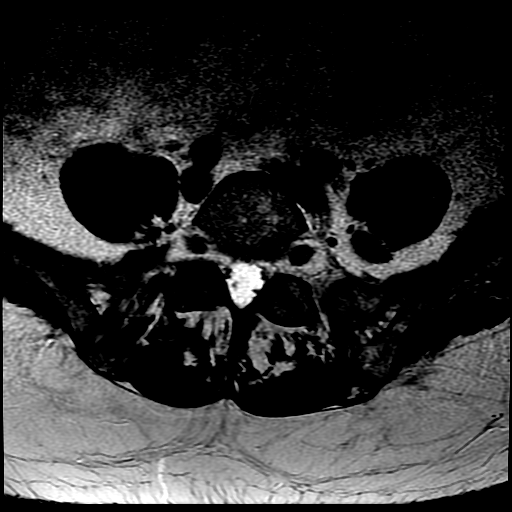
[im 19/37]
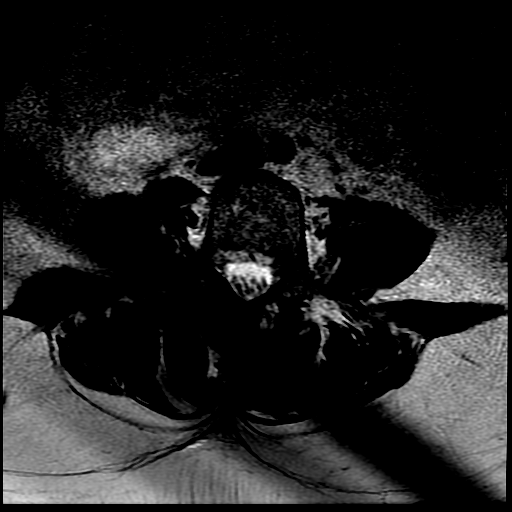
[im 31/37]
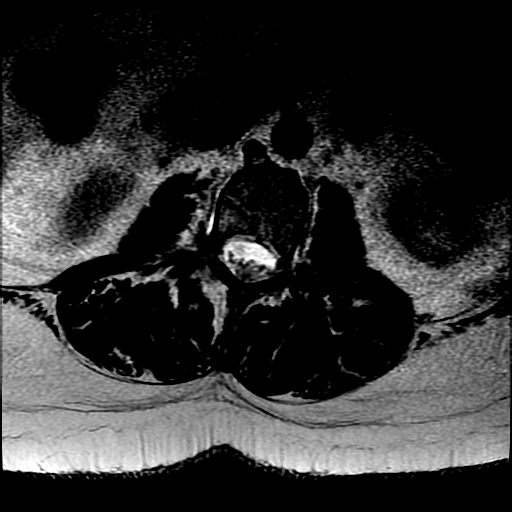

[Series 8: T1 · axial · 4.0mm · 0.39mm/px · z∈[-10,+122]mm · 3 of 37 slices shown (2 of 2)]
[im 6/37]
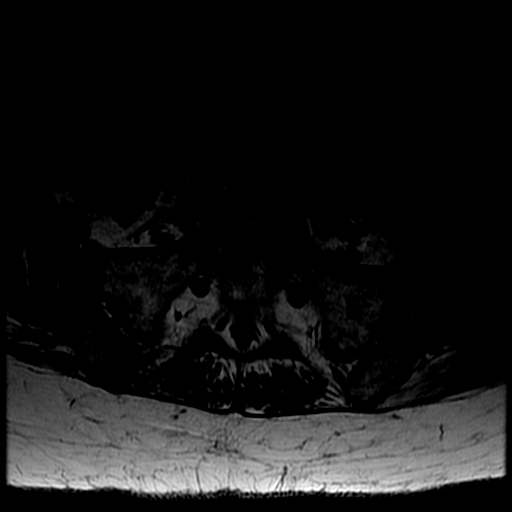
[im 19/37]
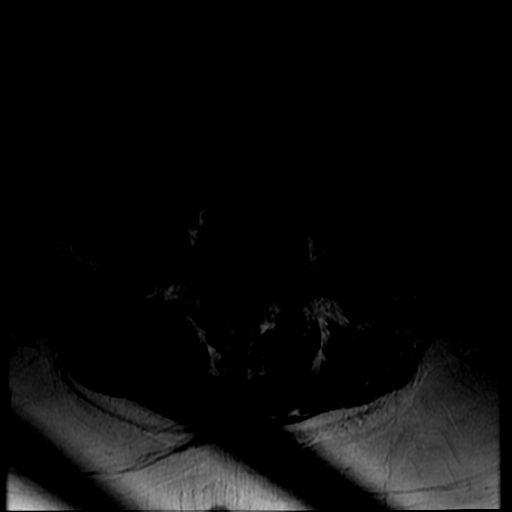
[im 31/37]
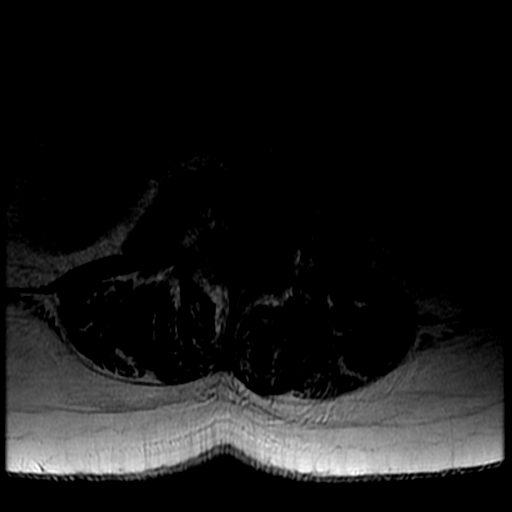

[17 of 48 positions shown; findings below may reference images not displayed]

FINDINGS: Segmentation: Standard; the lowest formed disc space is designated
L5-S1.

Alignment: There is significant levoscoliosis centered at L3,
grossly similar to the radiographs from [0N]. There is no antero or
retrolisthesis.

Vertebrae: Background marrow signal is normal. There is degenerative
endplate marrow signal abnormality at L3-L4, more so along the right
side of the disc space due to the scoliotic curvature. There is no
suspicious marrow signal abnormality or marrow edema.

Conus medullaris and cauda equina: Conus extends to the L1 level.
Conus and cauda equina appear normal.

Paraspinal and other soft tissues: Unremarkable.

Disc levels:

There is advanced disc desiccation and narrowing at L3-L4 with
associated degenerative endplate irregularity. There is mild
desiccation and narrowing at L2-L3. The other disc heights are
overall preserved.

T12-L1: No significant spinal canal or neural foraminal stenosis

L1-L2: There is a mild disc bulge without significant spinal canal
or neural foraminal stenosis

L2-L3: There is a diffuse disc bulge with an inferiorly migrated
right subarticular zone extrusion extending approximately 1.3 cm
inferiorly from the level of the superior L3 endplate, and mild
bilateral facet arthropathy resulting in moderate spinal canal
stenosis with crowding of the cauda equina nerve roots and
impingement of the traversing right L3 nerve root, and mild right
and no significant left neural foraminal stenosis

L3-L4: There is a mild disc bulge and bilateral facet arthropathy
with small effusions resulting in mild spinal canal stenosis and
mild bilateral neural foraminal stenosis

L4-L5: There is a broad-based left subarticular zone/foraminal disc
protrusion and bilateral facet arthropathy with a small right
effusion resulting in mild narrowing of the left subarticular zone
without evidence of nerve root impingement, and moderate left and
mild right neural foraminal stenosis

L5-S1: Mild bilateral facet arthropathy without significant spinal
canal or neural foraminal stenosis.
IMPRESSION: 1. Significant levoscoliosis centered at L3, grossly similar to the
radiographs from [0N]. There is associated disc space narrowing and
degenerative endplate change at L3-L4, more so along the right
aspect of the disc space due to the scoliotic curvature. There is a
mild disc bulge and bilateral facet arthropathy at this level
resulting in mild spinal canal stenosis and mild bilateral neural
foraminal stenosis.
2. Diffuse disc bulge with a superiorly migrated right subarticular
zone extrusion at L2-L3 resulting in moderate spinal canal stenosis
with crowding of the cauda equina nerve roots and impingement of the
traversing right L3 nerve root.
3. Broad-based left subarticular zone/foraminal protrusion at L3-L4
resulting in moderate left neural foraminal stenosis.

## 2021-08-10 ENCOUNTER — Ambulatory Visit: Payer: 59 | Admitting: Orthopedic Surgery

## 2021-08-10 DIAGNOSIS — M545 Low back pain, unspecified: Secondary | ICD-10-CM | POA: Diagnosis not present

## 2021-08-11 ENCOUNTER — Encounter: Payer: Self-pay | Admitting: Orthopedic Surgery

## 2021-08-11 NOTE — Progress Notes (Signed)
? ?Office Visit Note ?  ?Patient: Addy Mcmannis           ?Date of Birth: 07-Aug-1975           ?MRN: [id number] ?Visit Date: 08/10/2021 ?Requested by: Jake Shark, PA-C ?[address] ?[place],  [place] [address] ?PCP: Jake Shark, PA-C ? ?Subjective: ?Chief Complaint  ?Patient presents with  ? Other  ?  Scan review  ? ? ?HPI: Serah is a 46 year old patient with low back pain.  Had MRI scan done of her lumbar spine recently.  Hard for her to stand or sit or walk for too long.  On my review of the imaging she has L2-3 and L3-4 degenerative disc disease. ?             ?ROS: All systems reviewed are negative as they relate to the chief complaint within the history of present illness.  Patient denies  fevers or chills. ? ? ?Assessment & Plan: ?Visit Diagnoses:  ?1. Low back pain, unspecified back pain laterality, unspecified chronicity, unspecified whether sciatica present   ? ? ?Plan: Impression is low back pain.  Degenerative disc disease present.  Needs referral to Dr. Alvester Morin for lumbar spine ESI.  Follow-up with [place] as needed.  Avoid heavy lifting. ? ?Follow-Up Instructions: No follow-ups on file.  ? ?Orders:  ?Orders Placed This Encounter  ?Procedures  ? Ambulatory referral to Physical Medicine Rehab  ? ?No orders of the defined types were placed in this encounter. ? ? ? ? Procedures: ?No procedures performed ? ? ?Clinical Data: ?No additional findings. ? ?Objective: ?Vital Signs: LMP 05/16/2019  ? ?Physical Exam:  ? ?Constitutional: Patient appears well-developed ?HEENT:  ?Head: Normocephalic ?Eyes:EOM are normal ?Neck: Normal range of motion ?Cardiovascular: Normal rate ?Pulmonary/chest: Effort normal ?Neurologic: Patient is alert ?Skin: Skin is warm ?Psychiatric: Patient has normal mood and affect ? ? ?Ortho Exam: Ortho exam demonstrates no nerve root tension signs.  Good ankle dorsiflexion plantarflexion quad and hamstring strength with no definite paresthesias L1 S1 bilaterally.  Some pain  with forward lateral bending. ? ?Specialty Comments:  ?MRI LUMBAR SPINE WITHOUT CONTRAST ?  ?TECHNIQUE: ?Multiplanar, multisequence MR imaging of the lumbar spine was ?performed. No intravenous contrast was administered. ?  ?COMPARISON:  X-ray lumbar 06/23/2021 10/11/2015. ?  ?FINDINGS: ?Segmentation: Standard; the lowest formed disc space is designated ?L5-S1. ?  ?Alignment: There is significant levoscoliosis centered at L3, ?grossly similar to the radiographs from 2017. There is no antero or ?retrolisthesis. ?  ?Vertebrae: Background marrow signal is normal. There is degenerative ?endplate marrow signal abnormality at L3-L4, more so along the right ?side of the disc space due to the scoliotic curvature. There is no ?suspicious marrow signal abnormality or marrow edema. ?  ?Conus medullaris and cauda equina: Conus extends to the L1 level. ?Conus and cauda equina appear normal. ?  ?Paraspinal and other soft tissues: Unremarkable. ?  ?Disc levels: ?  ?There is advanced disc desiccation and narrowing at L3-L4 with ?associated degenerative endplate irregularity. There is mild ?desiccation and narrowing at L2-L3. The other disc heights are ?overall preserved. ?  ?T12-L1: No significant spinal canal or neural foraminal stenosis ?  ?L1-L2: There is a mild disc bulge without significant spinal canal ?or neural foraminal stenosis ?  ?L2-L3: There is a diffuse disc bulge with an inferiorly migrated ?right subarticular zone extrusion extending approximately 1.3 cm ?inferiorly from the level of the superior L3 endplate, and mild ?bilateral facet arthropathy resulting in moderate spinal canal ?  stenosis with crowding of the cauda equina nerve roots and ?impingement of the traversing right L3 nerve root, and mild right ?and no significant left neural foraminal stenosis ?  ?L3-L4: There is a mild disc bulge and bilateral facet arthropathy ?with small effusions resulting in mild spinal canal stenosis and ?mild bilateral neural  foraminal stenosis ?  ?L4-L5: There is a broad-based left subarticular zone/foraminal disc ?protrusion and bilateral facet arthropathy with a small right ?effusion resulting in mild narrowing of the left subarticular zone ?without evidence of nerve root impingement, and moderate left and ?mild right neural foraminal stenosis ?  ?L5-S1: Mild bilateral facet arthropathy without significant spinal ?canal or neural foraminal stenosis. ?  ?IMPRESSION: ?1. Significant levoscoliosis centered at L3, grossly similar to the ?radiographs from 2017. There is associated disc space narrowing and ?degenerative endplate change at L3-L4, more so along the right ?aspect of the disc space due to the scoliotic curvature. There is a ?mild disc bulge and bilateral facet arthropathy at this level ?resulting in mild spinal canal stenosis and mild bilateral neural ?foraminal stenosis. ?2. Diffuse disc bulge with a superiorly migrated right subarticular ?zone extrusion at L2-L3 resulting in moderate spinal canal stenosis ?with crowding of the cauda equina nerve roots and impingement of the ?traversing right L3 nerve root. ?3. Broad-based left subarticular zone/foraminal protrusion at L3-L4 ?resulting in moderate left neural foraminal stenosis. ?  ?  ?Electronically Signed ?  By: Lesia Hausen M.D. ?  On: 08/10/2021 11:10 ? ?Imaging: ?No results found. ? ? ?PMFS History: ?Patient Active Problem List  ? Diagnosis Date Noted  ? Body mass index (BMI) of 32.0 to 32.9 in adult 08/02/2021  ? High risk medication use 07/05/2021  ? Moderate persistent asthma without complication 09/17/2020  ? S/P total knee arthroplasty, left 08/17/2020  ? Inflammatory arthritis 10/10/2019  ? Perimenopausal disorder 11/28/2018  ? GAD (generalized anxiety disorder) 06/28/2018  ? Primary osteoarthritis of left knee 04/17/2018  ? Prediabetes 12/03/2017  ? Irritable bowel 03/19/2017  ? GERD without esophagitis 03/19/2017  ? Fibromyalgia 11/28/2016  ? Migraine without aura  and without status migrainosus, not intractable 10/17/2016  ? Essential hypertension 10/17/2016  ? Anxiety and depression 03/28/2016  ? ?Past Medical History:  ?Diagnosis Date  ? Anemia   ? Anxiety   ? Arthritis   ? left knee  ? Asthma   ? COPD (chronic obstructive pulmonary disease) (HCC)   ? Depression   ? GERD (gastroesophageal reflux disease)   ? High cholesterol   ? Per patient  ? Hypertension   ? Neuromuscular disorder (HCC)   ? right arm  ? PTSD (post-traumatic stress disorder)   ? Per patient  ?  ?Family History  ?Problem Relation Age of Onset  ? Rheum arthritis Mother   ? Hypertension Mother   ? Depression Mother   ? Anxiety disorder Mother   ? Bipolar disorder Father   ? Mental illness Sister   ? Depression Sister   ? Congestive Heart Failure Maternal Grandfather   ?  ?Past Surgical History:  ?Procedure Laterality Date  ? DILATION AND CURETTAGE OF UTERUS    ? 2002  ? TOTAL KNEE ARTHROPLASTY Left 08/17/2020  ? Procedure: LEFT TOTAL KNEE ARTHROPLASTY;  Surgeon: Cammy Copa, MD;  Location: [facility] OR;  Service: Orthopedics;  Laterality: Left;  ? UPPER GI ENDOSCOPY    ? WISDOM TOOTH EXTRACTION    ? ?Social History  ? ?Occupational History  ? Not on file  ?Tobacco Use  ?  Smoking status: Never  ? Smokeless tobacco: Never  ?Vaping Use  ? Vaping Use: Never used  ?Substance and Sexual Activity  ? Alcohol use: Yes  ?  Comment: Occasionally  ? Drug use: Never  ? Sexual activity: Not on file  ? ? ? ? ? ?

## 2021-08-18 ENCOUNTER — Encounter: Payer: 59 | Admitting: Gastroenterology

## 2021-08-20 ENCOUNTER — Other Ambulatory Visit: Payer: Self-pay | Admitting: Internal Medicine

## 2021-08-20 DIAGNOSIS — M797 Fibromyalgia: Secondary | ICD-10-CM

## 2021-08-20 DIAGNOSIS — M1990 Unspecified osteoarthritis, unspecified site: Secondary | ICD-10-CM

## 2021-08-26 ENCOUNTER — Ambulatory Visit (INDEPENDENT_AMBULATORY_CARE_PROVIDER_SITE_OTHER): Payer: 59 | Admitting: Physician Assistant

## 2021-08-26 ENCOUNTER — Encounter: Payer: Self-pay | Admitting: Physician Assistant

## 2021-08-26 VITALS — BP 120/80 | HR 88 | Ht 66.0 in | Wt 202.6 lb

## 2021-08-26 DIAGNOSIS — J3089 Other allergic rhinitis: Secondary | ICD-10-CM

## 2021-08-26 DIAGNOSIS — R7303 Prediabetes: Secondary | ICD-10-CM

## 2021-08-26 DIAGNOSIS — J302 Other seasonal allergic rhinitis: Secondary | ICD-10-CM | POA: Diagnosis not present

## 2021-08-26 DIAGNOSIS — L282 Other prurigo: Secondary | ICD-10-CM | POA: Diagnosis not present

## 2021-08-26 MED ORDER — METHYLPREDNISOLONE 4 MG PO TBPK: ORAL_TABLET | ORAL | 0 refills | Status: DC

## 2021-08-26 NOTE — Patient Instructions (Signed)
You can take an over the counter antihistamine to help with allergic rhinitis / itching / hives: NON-DROWSY Allegra (Fexofenadine) 180 mg daily or NON-Drowsy Claritin (Loratidine) 10 mg daily  or DROWSY Benadryl (Diphenhydramine) 25 mg as directed or Zyrtec (Cetirizine) 10 mg daily. You can go to a store with a pharmacy and ask them to help you find these medicines. ? ?

## 2021-08-26 NOTE — Telephone Encounter (Signed)
Please call patient to offer an in person office visit.  ? ?She can take an over the counter antihistamine to help with itching:  ?NON-DROWSY Allegra (Fexofenadine) 180 mg daily or NON-Drowsy Claritin (Loratidine) 10 mg daily  or DROWSY Benadryl (Diphenhydramine) 25 mg as directed or Zyrtec (Cetirizine) 10 mg daily. ?

## 2021-08-26 NOTE — Progress Notes (Signed)
? ?Acute Office Visit ? ?Subjective:  ? ? Patient ID: Kristi Decker, female    DOB: 31-Jan-1976, 46 y.o.   MRN: [id number] ? ?Chief Complaint  ?Patient presents with  ? Acute Visit  ?  Pt starting itching all over her body. She has small red bumps on her legs.  ? ? ?HPI ?Patient is in today for started having very itchy skin x 4 days from neck to lower legs; denies recent travel; denies sick contacts; had allergy testing as a child was allergic to cats, mold, some dogs, ragweed, dust mites; denies fever; denies any new skin irritants; recently changed jobs to a different company that she will start in 3 days, but denies any new stressors; states changing jobs is relieving stress; used OTC Calamyne lotion on legs and it helped the itching, denies taking an antihistamine; states she Goggled her symptoms and is concerned that the itching is due to diabetes, since she is pre-diabetic ? ?Outpatient Medications Prior to Visit  ?Medication Sig Dispense Refill  ? albuterol (VENTOLIN HFA) 108 (90 Base) MCG/ACT inhaler Inhale 2 puffs into the lungs every 6 (six) hours as needed for wheezing or shortness of breath.    ? APPLE CIDER VINEGAR PO Take 1 capsule by mouth 2 (two) times daily.    ? atorvastatin (LIPITOR) 20 MG tablet Take 1 tablet (20 mg total) by mouth daily. 90 tablet 1  ? Calcium-Magnesium-Zinc (CAL-MAG-ZINC PO) Take 1 tablet by mouth daily.    ? cholecalciferol (VITAMIN D3) 25 MCG (1000 UNIT) tablet Take 1,000 Units by mouth daily.    ? clonazePAM (KLONOPIN) 0.5 MG tablet Take 0.5 mg by mouth 2 (two) times daily as needed for anxiety.    ? Cyanocobalamin (VITAMIN B-12 PO) Take 1 tablet by mouth daily.    ? diclofenac (VOLTAREN) 75 MG EC tablet TAKE ONE TABLET BY MOUTH TWICE A DAY 60 tablet 2  ? DULoxetine (CYMBALTA) 60 MG capsule Take 120 mg by mouth daily.    ? estradiol (ESTRACE) 0.5 MG tablet Take 1 tablet (0.5 mg total) by mouth daily. 30 tablet 11  ? ferrous sulfate 325 (65 FE) MG tablet Take 325 mg by mouth  daily.    ? lamoTRIgine (LAMICTAL) 25 MG tablet Take 75 mg by mouth daily.    ? lisinopril-hydrochlorothiazide (ZESTORETIC) 20-12.5 MG tablet Take 1 tablet by mouth daily. 90 tablet 1  ? pantoprazole (PROTONIX) 40 MG tablet Take 1 tablet (40 mg total) by mouth daily. 90 tablet 1  ? progesterone (PROMETRIUM) 100 MG capsule Take 1 capsule (100 mg total) by mouth at bedtime. 30 capsule 12  ? sulfaSALAzine (AZULFIDINE) 500 MG tablet TAKE TWO TABLETS BY MOUTH TWICE A DAY 120 tablet 2  ? SYMBICORT 160-4.5 MCG/ACT inhaler INHALE 2 PUFFS INTO THE LUNGS TWO TIMES A DAY AS NEEDED FOR SHORTNESS OF BREATH/WHEEZING 10.2 g 5  ? methocarbamol (ROBAXIN) 500 MG tablet Take 1 tablet (500 mg total) by mouth every 8 (eight) hours as needed for muscle spasms. (Patient not taking: Reported on 08/26/2021) 30 tablet 0  ? QUEtiapine (SEROQUEL XR) 200 MG 24 hr tablet Take 200 mg by mouth at bedtime. (Patient not taking: Reported on 08/26/2021)    ? ?No facility-administered medications prior to visit.  ? ? ?No Known Allergies ? ?Review of Systems  ?Constitutional:  Negative for activity change and chills.  ?HENT:  Negative for congestion and voice change.   ?Eyes:  Negative for pain and redness.  ?Respiratory:  Negative for  cough and wheezing.   ?Cardiovascular:  Negative for chest pain.  ?Gastrointestinal:  Negative for constipation, diarrhea, nausea and vomiting.  ?Endocrine: Negative for polyuria.  ?Genitourinary:  Negative for frequency.  ?Skin:  Positive for color change and rash. Negative for pallor and wound.  ?Allergic/Immunologic: Positive for environmental allergies. Negative for food allergies and immunocompromised state.  ?Neurological:  Negative for dizziness.  ?Psychiatric/Behavioral:  Negative for agitation.   ? ?   ?Objective:  ?  ?Physical Exam ?Vitals and nursing note reviewed.  ?Constitutional:   ?   General: She is not in acute distress. ?   Appearance: Normal appearance. She is not ill-appearing.  ?HENT:  ?   Head:  Normocephalic and atraumatic.  ?   Right Ear: External ear normal.  ?   Left Ear: External ear normal.  ?   Nose: No congestion.  ?Eyes:  ?   Extraocular Movements: Extraocular movements intact.  ?   Conjunctiva/sclera: Conjunctivae normal.  ?   Pupils: Pupils are equal, round, and reactive to light.  ?Cardiovascular:  ?   Rate and Rhythm: Normal rate and regular rhythm.  ?   Pulses: Normal pulses.  ?   Heart sounds: Normal heart sounds.  ?Pulmonary:  ?   Effort: Pulmonary effort is normal.  ?   Breath sounds: Normal breath sounds. No wheezing.  ?Abdominal:  ?   General: Bowel sounds are normal.  ?   Palpations: Abdomen is soft.  ?Musculoskeletal:     ?   General: Normal range of motion.  ?   Cervical back: Normal range of motion and neck supple.  ?   Right lower leg: No edema.  ?   Left lower leg: No edema.  ?Skin: ?   General: Skin is warm and dry.  ?   Findings: Bruising, erythema and rash present.  ?   Comments: Diffuse erythematous papules from neck to feet; mild bruising on left lower leg  ?Neurological:  ?   General: No focal deficit present.  ?   Mental Status: She is alert and oriented to person, place, and time.  ?Psychiatric:     ?   Mood and Affect: Mood normal.     ?   Behavior: Behavior normal.     ?   Thought Content: Thought content normal.  ? ? ?BP 120/80   Pulse 88   Ht 5' 6" (1.676 m)   Wt 202 lb 9.6 oz (91.9 kg)   LMP 05/16/2019   SpO2 97%   BMI 32.70 kg/m?  ? ?Wt Readings from Last 3 Encounters:  ?08/26/21 202 lb 9.6 oz (91.9 kg)  ?08/02/21 202 lb 3.2 oz (91.7 kg)  ?07/05/21 205 lb (93 kg)  ? ? ?Results for orders placed or performed in visit on 07/05/21  ?Sedimentation rate  ?Result Value Ref Range  ? Sed Rate 11 0 - 20 mm/h  ?CBC with Differential/Platelet  ?Result Value Ref Range  ? WBC 6.4 3.8 - 10.8 Thousand/uL  ? RBC 4.12 3.80 - 5.10 Million/uL  ? Hemoglobin 12.8 11.7 - 15.5 g/dL  ? HCT 39.3 35.0 - 45.0 %  ? MCV 95.4 80.0 - 100.0 fL  ? MCH 31.1 27.0 - 33.0 pg  ? MCHC 32.6 32.0 - 36.0  g/dL  ? RDW 14.4 11.0 - 15.0 %  ? Platelets 355 140 - 400 Thousand/uL  ? MPV 9.6 7.5 - 12.5 fL  ? Neutro Abs 3,238 1,500 - 7,800 cells/uL  ? Lymphs Abs 2,323 850 -  3,900 cells/uL  ? Absolute Monocytes 518 200 - 950 cells/uL  ? Eosinophils Absolute 301 15 - 500 cells/uL  ? Basophils Absolute 19 0 - 200 cells/uL  ? Neutrophils Relative % 50.6 %  ? Total Lymphocyte 36.3 %  ? Monocytes Relative 8.1 %  ? Eosinophils Relative 4.7 %  ? Basophils Relative 0.3 %  ?COMPLETE METABOLIC PANEL WITH GFR  ?Result Value Ref Range  ? Glucose, Bld 122 (H) 65 - 99 mg/dL  ? BUN 17 7 - 25 mg/dL  ? Creat 0.58 0.50 - 0.99 mg/dL  ? eGFR 112 > OR = 60 mL/min/1.34m  ? BUN/Creatinine Ratio NOT APPLICABLE 6 - 22 (calc)  ? Sodium 137 135 - 146 mmol/L  ? Potassium 3.8 3.5 - 5.3 mmol/L  ? Chloride 102 98 - 110 mmol/L  ? CO2 27 20 - 32 mmol/L  ? Calcium 9.6 8.6 - 10.2 mg/dL  ? Total Protein 6.8 6.1 - 8.1 g/dL  ? Albumin 4.5 3.6 - 5.1 g/dL  ? Globulin 2.3 1.9 - 3.7 g/dL (calc)  ? AG Ratio 2.0 1.0 - 2.5 (calc)  ? Total Bilirubin 0.4 0.2 - 1.2 mg/dL  ? Alkaline phosphatase (APISO) 146 (H) 31 - 125 U/L  ? AST 24 10 - 35 U/L  ? ALT 35 (H) 6 - 29 U/L  ? ? ?   ?Assessment & Plan:  ?1. Pruritic rash ?- Ambulatory referral to Allergy ?- medrol dosepak and OTC antihistamines ? ?2. Seasonal allergic rhinitis, unspecified trigger ?- Ambulatory referral to Allergy ?- OTC antihistamines ? ?3. Prediabetes ?- Hemoglobin A1c ?- controlled, eat a low sugar diet, avoid starchy food with a lot of carbohydrates, avoid fried and processed foods; hgb a1c checked today ? ?4. Environmental and seasonal allergies ?- Ambulatory referral to Allergy ?- OTC antihistamines ? ? ? ?Meds ordered this encounter  ?Medications  ? methylPREDNISolone (MEDROL DOSEPAK) 4 MG TBPK tablet  ?  Sig: Take tapering pack as directed  ?  Dispense:  21 tablet  ?  Refill:  0  ?  Order Specific Question:   Supervising Provider  ?  Answer:   LDenita Lung[[[id number] ? ? ?Return for Return as Already  Scheduled. ? ?LIrene Pap PA-C ?

## 2021-08-27 LAB — HEMOGLOBIN A1C
Est. average glucose Bld gHb Est-mCnc: 103 mg/dL
Hgb A1c MFr Bld: 5.2 % (ref 4.8–5.6)

## 2021-08-31 ENCOUNTER — Ambulatory Visit (AMBULATORY_SURGERY_CENTER): Payer: 59 | Admitting: *Deleted

## 2021-08-31 VITALS — Ht 67.0 in | Wt 202.0 lb

## 2021-08-31 DIAGNOSIS — Z1211 Encounter for screening for malignant neoplasm of colon: Secondary | ICD-10-CM

## 2021-08-31 MED ORDER — PEG 3350-KCL-NA BICARB-NACL 420 G PO SOLR: 4000.0000 mL | Freq: Once | ORAL | 0 refills | Status: AC

## 2021-08-31 NOTE — Progress Notes (Signed)
No egg or soy allergy known to patient  ?No issues known to pt with past sedation with any surgeries or procedures ?Patient denies ever being told they had issues or difficulty with intubation  ?No FH of Malignant Hyperthermia ?Pt is not on diet pills ?Pt is not on  home 02  ?Pt is not on blood thinners  ?Pt denies issues with constipation  ?No A fib or A flutter ? ? NO PA's for preps discussed with pt In PV today  ?Discussed with pt there will be an out-of-pocket cost for prep and that varies from $0 to 70 +  dollars - pt verbalized understanding  ?Pt instructed to use Singlecare.com or GoodRx for a price reduction on prep  ? ?PV completed over the phone. Pt verified name, DOB, address and insurance during PV today.  ?Pt mailed instruction packet with copy of consent form to read and not return, and instructions.  ?Pt encouraged to call with questions or issues.  ?If pt has My chart, procedure instructions sent via My Chart  ? ?

## 2021-09-09 ENCOUNTER — Encounter: Payer: Self-pay | Admitting: Physician Assistant

## 2021-09-11 NOTE — Telephone Encounter (Signed)
Patient will need an in office appointment with me so I can properly document symptoms that indicate the need for a sleep study, for it to be covered by insurance. Thanks.

## 2021-09-12 HISTORY — PX: COLONOSCOPY: SHX174

## 2021-09-13 ENCOUNTER — Ambulatory Visit (INDEPENDENT_AMBULATORY_CARE_PROVIDER_SITE_OTHER): Payer: 59 | Admitting: Physical Medicine and Rehabilitation

## 2021-09-13 ENCOUNTER — Ambulatory Visit: Payer: Self-pay

## 2021-09-13 VITALS — BP 134/91 | HR 105

## 2021-09-13 DIAGNOSIS — M5416 Radiculopathy, lumbar region: Secondary | ICD-10-CM

## 2021-09-13 MED ORDER — METHYLPREDNISOLONE ACETATE 80 MG/ML IJ SUSP
80.0000 mg | Freq: Once | INTRAMUSCULAR | Status: AC
  Administered 2021-09-13: 80 mg

## 2021-09-13 NOTE — Progress Notes (Signed)
Pt state lower back pain that travels down her left leg. Pt state standing and walking makes the pain worse pain. Pt state she takes pain meds to help pain.  Numeric Pain Rating Scale and Functional Assessment Average Pain 6   In the last MONTH (on 0-10 scale) has pain interfered with the following?  1. General activity like being  able to carry out your everyday physical activities such as walking, climbing stairs, carrying groceries, or moving a chair?  Rating(10)   +Driver, -BT, -Dye Allergies.

## 2021-09-13 NOTE — Patient Instructions (Signed)

## 2021-09-19 ENCOUNTER — Encounter: Payer: Self-pay | Admitting: Gastroenterology

## 2021-09-20 NOTE — Progress Notes (Signed)
Office Visit Note  Patient: Kristi Decker             Date of Birth: 05/10/1976           MRN: [id number]             PCP: Lexine Baton Referring: Lexine Baton Visit Date: 10/04/2021   Subjective:  Arthritis (Doing good)   History of Present Illness: Kristi Decker is a 46 y.o. female here for follow up for seronegative RA on sulfasalazine 1000 mg BID and diclofenac 75 mg BID.  She had to temporarily interrupt medications for colonoscopy since her last visit and felt severe worsening of joint pains in all of her body pain within days of stopping the medications.  These are overall improved again since restarting.  She saw Dr. Cyndie Chime for low back epidural steroid injection no complications from this but has not noticed a significant improvement in low back symptoms.  She was recommended this may require trial of up to 2 or 3 shots to determine efficacy.  She is trying to remain active doing stretches for her back and is working on weight reduction trying to decrease carbohydrates from the diet.  She was referred to allergy and asthma clinic for allergy testing.  Previous HPI 07/05/2021 Kristi Decker is a 46 y.o. 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 She still has a lot of joint pain bothering her especially in her back. She is frustrated by persistent of pain and stiffness limiting physical activities. She does feel there is ongoing benefit with sulfasalazine but not helping her back very much. She saw Dr. August Saucer for this problem about 2 weeks ago on 06/23/21 trying conservative treatment with tylenol 3 and robaxin but has not felt a good relief with this so far.   Previous HPI 11/16/20 Kristi Decker is a 46 y.o. female here for chronic polyarticular inflammatory joint pain.  She was previously diagnosed with seronegative rheumatoid arthritis since about 2020 with previous treatment on oral diclofenac and  sulfasalazine with apparently a significant response to treatment.  She also has chronic degenerative arthritis of multiple areas including the back for which she is undergone ESI and her left knee that underwent total knee arthroplasty with Dr. August Saucer earlier this year with a very great improvement in joint pain symptoms.  She discontinued her medication perioperatively and felt that her all over joint pains worsened very noticeably with treatment interruption.  She does also have history for suspected fibromyalgia contributing to generalized joint pains as well as associated symptoms of chronic fatigue and unrefreshing sleep, exertion intolerance worsening symptoms for a day longer afterwards, episodic all over pain that feels like having the flu, previous migraine headaches that have improved, and frequent feeling of cognitive impairment or difficulty.    Review of Systems  Constitutional:  Positive for fatigue.  HENT:  Negative for mouth dryness.   Eyes:  Negative for dryness.  Respiratory:  Negative for shortness of breath.   Cardiovascular:  Negative for swelling in legs/feet.  Gastrointestinal:  Negative for constipation.  Endocrine: Negative for excessive thirst.  Genitourinary:  Negative for difficulty urinating.  Musculoskeletal:  Positive for joint pain, joint pain, morning stiffness and muscle tenderness.  Skin:  Negative for rash.  Allergic/Immunologic: Negative for susceptible to infections.  Neurological:  Positive for numbness.  Hematological:  Negative for bruising/bleeding tendency.  Psychiatric/Behavioral:  Negative for sleep disturbance.  PMFS History:  Patient Active Problem List   Diagnosis Date Noted   Low back pain 10/04/2021   Environmental and seasonal allergies 08/26/2021   Seasonal allergic rhinitis 08/26/2021   Body mass index (BMI) of 32.0 to 32.9 in adult 08/02/2021   High risk medication use 07/05/2021   Moderate persistent asthma without complication  09/17/2020   S/P total knee arthroplasty, left 08/17/2020   Inflammatory arthritis 10/10/2019   Perimenopausal disorder 11/28/2018   GAD (generalized anxiety disorder) 06/28/2018   Primary osteoarthritis of left knee 04/17/2018   Prediabetes 12/03/2017   Irritable bowel 03/19/2017   GERD without esophagitis 03/19/2017   Fibromyalgia 11/28/2016   Migraine without aura and without status migrainosus, not intractable 10/17/2016   Essential hypertension 10/17/2016   Anxiety and depression 03/28/2016    Past Medical History:  Diagnosis Date   Allergy    Anemia    on iron- past hx   Anxiety    Arthritis    left knee   Asthma    COPD (chronic obstructive pulmonary disease) (HCC)    Depression    GERD (gastroesophageal reflux disease)    High cholesterol    Per patient   Hypertension    Neuromuscular disorder (HCC)    right arm   PTSD (post-traumatic stress disorder)    Per patient   Refusal of blood product     Family History  Problem Relation Age of Onset   Rheum arthritis Mother    Hypertension Mother    Depression Mother    Anxiety disorder Mother    Bipolar disorder Father    Mental illness Sister    Depression Sister    Congestive Heart Failure Maternal Grandfather    Colon cancer Neg Hx    Colon polyps Neg Hx    Esophageal cancer Neg Hx    Rectal cancer Neg Hx    Stomach cancer Neg Hx    Past Surgical History:  Procedure Laterality Date   DILATION AND CURETTAGE OF UTERUS     2002   TOTAL KNEE ARTHROPLASTY Left 08/17/2020   Procedure: LEFT TOTAL KNEE ARTHROPLASTY;  Surgeon: Cammy Copa, MD;  Location: [facility] OR;  Service: Orthopedics;  Laterality: Left;   UPPER GASTROINTESTINAL ENDOSCOPY     UPPER GI ENDOSCOPY     WISDOM TOOTH EXTRACTION     Social History   Social History Narrative   Not on file   Immunization History  Administered Date(s) Administered   Influenza,inj,Quad PF,6+ Mos 02/24/2016, 02/08/2017, 02/22/2018, 02/14/2019, 02/04/2020    Influenza-Unspecified 04/21/2021   PFIZER(Purple Top)SARS-COV-2 Vaccination 08/24/2019, 09/14/2019   Pfizer Covid-19 Vaccine Bivalent Booster 52yrs & up 04/26/2020, 11/11/2020   Pneumococcal Polysaccharide-23 02/04/2020   Tdap 11/28/2018     Objective: Vital Signs: BP 135/82 (BP Location: Left Arm, Patient Position: Sitting, Cuff Size: Normal)   Pulse 92   Resp 15   Ht 5\' 6"  (1.676 m)   Wt 204 lb (92.5 kg)   LMP 05/16/2019   BMI 32.93 kg/m    Physical Exam Constitutional:      Appearance: She is obese.  Cardiovascular:     Rate and Rhythm: Normal rate and regular rhythm.  Pulmonary:     Effort: Pulmonary effort is normal.     Breath sounds: Normal breath sounds.  Skin:    General: Skin is warm and dry.  Neurological:     Mental Status: She is alert.  Psychiatric:        Mood and Affect: Mood normal.  Musculoskeletal Exam:  Shoulders full ROM no swelling, some tenderness to pressure over upper back muscles and trapezius Elbows full ROM no tenderness or swelling Wrists full ROM no tenderness or swelling Fingers full ROM  Low back midline and paraspinal tenderness to pressure bilaterally Right knee patellofemoral crepitus range of movement is intact, left knee well-healed surgical scar and full range of motion no palpable effusions   Investigation: No additional findings.  Imaging: Epidural Steroid injection  Result Date: 09/13/2021 Tyrell Antonio, MD     10/03/2021  7:28 PM Lumbar Epidural Steroid Injection - Interlaminar Approach with Fluoroscopic Guidance Patient: Alia Parsley     Date of Birth: [birth year] MRN: [id number] PCP: Lexine Baton     Visit Date: 09/13/2021  Universal Protocol:   Consent Given By: the patient Position: PRONE Additional Comments: Vital signs were monitored before and after the procedure. Patient was prepped and draped in the usual sterile fashion. The correct patient, procedure, and site was verified. Injection Procedure Details:  Procedure diagnoses: Lumbar radiculopathy [M54.16] Meds Administered: Meds ordered this encounter Medications  methylPREDNISolone acetate (DEPO-MEDROL) injection 80 mg  Laterality: Left Location/Site:  L3-4 Needle: 3.5 in., 20 ga. Tuohy Needle Placement: Paramedian epidural Findings:  -Comments: Excellent flow of contrast into the epidural space. Procedure Details: Using a paramedian approach from the side mentioned above, the region overlying the inferior lamina was localized under fluoroscopic visualization and the soft tissues overlying this structure were infiltrated with 4 ml. of 1% Lidocaine without Epinephrine. The Tuohy needle was inserted into the epidural space using a paramedian approach. The epidural space was localized using loss of resistance along with counter oblique bi-planar fluoroscopic views.  After negative aspirate for air, blood, and CSF, a 2 ml. volume of Isovue-250 was injected into the epidural space and the flow of contrast was observed. Radiographs were obtained for documentation purposes.  The injectate was administered into the level noted above. Additional Comments: The patient tolerated the procedure well Dressing: 2 x 2 sterile gauze and Band-Aid  Post-procedure details: Patient was observed during the procedure. Post-procedure instructions were reviewed. Patient left the clinic in stable condition.   XR C-ARM NO REPORT  Result Date: 09/13/2021 Please see Notes tab for imaging impression.   Recent Labs: Lab Results  Component Value Date   WBC 6.4 07/05/2021   HGB 12.8 07/05/2021   PLT 355 07/05/2021   NA 137 07/05/2021   K 3.8 07/05/2021   CL 102 07/05/2021   CO2 27 07/05/2021   GLUCOSE 122 (H) 07/05/2021   BUN 17 07/05/2021   CREATININE 0.58 07/05/2021   BILITOT 0.4 07/05/2021   AST 24 07/05/2021   ALT 35 (H) 07/05/2021   PROT 6.8 07/05/2021   CALCIUM 9.6 07/05/2021    Speciality Comments: No specialty comments available.  Procedures:  No procedures  performed Allergies: Patient has no known allergies.   Assessment / Plan:     Visit Diagnoses: Inflammatory arthritis  Plan to continue sulfasalazine 1000 mg BID and diclofenac 75 mg BID for seronegative rheumatoid arthritis. Symptoms outside of her back pain are doing okay on this regimen, had severe pain when interrupted. So do not recommend trial of tapering treatment at this time.  Fibromyalgia  Stable symptoms doing alright on the cymbalta 120 mg daily. Not too much generalized pain. She feels energy is maybe better working on low carbohydrate/lazy keto diet.  High risk medication use - SSZ 1000 mg BID  - Plan: CBC with Differential/Platelet, COMPLETE METABOLIC PANEL  WITH GFR  Checking CBC and CMP for medication monitoring continuing SSZ and NSAID treatment.  Chronic bilateral low back pain without sciatica  No relief reported after one sessions ESI. I recommend she contact Dr. Alvester MorinNewton for follow up plan may be able to repeat this after several weeks vs months or to discuss options.  Orders: Orders Placed This Encounter  Procedures   CBC with Differential/Platelet   COMPLETE METABOLIC PANEL WITH GFR   No orders of the defined types were placed in this encounter.    Follow-Up Instructions: Return in about 3 months (around 01/04/2022) for RA/FMS on SSZ/diclofenac f/u 3mos.   Fuller Planhristopher W Shed Nixon, MD  Note - This record has been created using [employer] software.  Chart creation errors have been sought, but may not always  have been located. Such creation errors do not reflect on  the standard of medical care.

## 2021-09-27 ENCOUNTER — Encounter: Payer: Self-pay | Admitting: Gastroenterology

## 2021-09-27 ENCOUNTER — Ambulatory Visit (AMBULATORY_SURGERY_CENTER): Payer: 59 | Admitting: Gastroenterology

## 2021-09-27 VITALS — BP 110/72 | HR 88 | Temp 97.3°F | Resp 14 | Ht 66.0 in | Wt 202.0 lb

## 2021-09-27 DIAGNOSIS — Z8371 Family history of colonic polyps: Secondary | ICD-10-CM

## 2021-09-27 DIAGNOSIS — Z1211 Encounter for screening for malignant neoplasm of colon: Secondary | ICD-10-CM

## 2021-09-27 MED ORDER — SODIUM CHLORIDE 0.9 % IV SOLN: 500.0000 mL | Freq: Once | INTRAVENOUS | Status: DC

## 2021-09-27 NOTE — Progress Notes (Signed)
Pt's states no medical or surgical changes since previsit or office visit. 

## 2021-09-27 NOTE — Patient Instructions (Signed)
Resume previous diet and medications. Repeat Colonoscopy in 10 years for screening purposes. ° °YOU HAD AN ENDOSCOPIC PROCEDURE TODAY AT THE [facility]:   Refer to the procedure report that was given to you for any specific questions about what was found during the examination.  If the procedure report does not answer your questions, please call your gastroenterologist to clarify.  If you requested that your care partner not be given the details of your procedure findings, then the procedure report has been included in a sealed envelope for you to review at your convenience later. ° °YOU SHOULD EXPECT: Some feelings of bloating in the abdomen. Passage of more gas than usual.  Walking can help get rid of the air that was put into your GI tract during the procedure and reduce the bloating. If you had a lower endoscopy (such as a colonoscopy or flexible sigmoidoscopy) you may notice spotting of blood in your stool or on the toilet paper. If you underwent a bowel prep for your procedure, you may not have a normal bowel movement for a few days. ° °Please Note:  You might notice some irritation and congestion in your nose or some drainage.  This is from the oxygen used during your procedure.  There is no need for concern and it should clear up in a day or so. ° °SYMPTOMS TO REPORT IMMEDIATELY: ° °Following lower endoscopy (colonoscopy or flexible sigmoidoscopy): ° Excessive amounts of blood in the stool ° Significant tenderness or worsening of abdominal pains ° Swelling of the abdomen that is new, acute ° Fever of 100°F or higher ° °For urgent or emergent issues, a gastroenterologist can be reached at any hour by calling [phone]. °Do not use MyChart messaging for urgent concerns.  ° ° °DIET:  We do recommend a small meal at first, but then you may proceed to your regular diet.  Drink plenty of fluids but you should avoid alcoholic beverages for 24 hours. ° °ACTIVITY:  You should plan to take it easy  for the rest of today and you should NOT DRIVE or use heavy machinery until tomorrow (because of the sedation medicines used during the test).   ° °FOLLOW UP: °Our staff will call the number listed on your records 48-72 hours following your procedure to check on you and address any questions or concerns that you may have regarding the information given to you following your procedure. If we do not reach you, we will leave a message.  We will attempt to reach you two times.  During this call, we will ask if you have developed any symptoms of COVID 19. If you develop any symptoms (ie: fever, flu-like symptoms, shortness of breath, cough etc.) before then, please call [phone].  If you test positive for Covid 19 in the 2 weeks post procedure, please call and report this information to us.   ° °If any biopsies were taken you will be contacted by phone or by letter within the next 1-3 weeks.  Please call us at [phone] if you have not heard about the biopsies in 3 weeks.  ° ° °SIGNATURES/CONFIDENTIALITY: °You and/or your care partner have signed paperwork which will be entered into your electronic medical record.  These signatures attest to the fact that that the information above on your After Visit Summary has been reviewed and is understood.  Full responsibility of the confidentiality of this discharge information lies with you and/or your care-partner.  °

## 2021-09-27 NOTE — Progress Notes (Signed)
? ?Referring Provider: Jake Shark, PA-C ?Primary Care Physician:  Jake Shark, PA-C ? ?Indication for Procedure:  Colon cancer screening ? ? ?IMPRESSION:  ?Need for colon cancer screening ?Appropriate candidate for monitored anesthesia care ? ?PLAN: ?Colonoscopy in the [facility] today ? ? ?HPI: Kristi Decker is a 46 y.o. female presents for screening colonoscopy. ? ?No prior colonoscopy or colon cancer screening. ? ?No baseline GI symptoms.  ? ?Mother with colon polyps but they were not precancerous. No other known family history of colon cancer or polyps. No family history of uterine/endometrial cancer, pancreatic cancer or gastric/stomach cancer. ? ? ?Past Medical History:  ?Diagnosis Date  ? Allergy   ? Anemia   ? on iron- past hx  ? Anxiety   ? Arthritis   ? left knee  ? Asthma   ? COPD (chronic obstructive pulmonary disease) (HCC)   ? Depression   ? GERD (gastroesophageal reflux disease)   ? High cholesterol   ? Per patient  ? Hypertension   ? Neuromuscular disorder (HCC)   ? right arm  ? PTSD (post-traumatic stress disorder)   ? Per patient  ? Refusal of blood product   ? ? ?Past Surgical History:  ?Procedure Laterality Date  ? DILATION AND CURETTAGE OF UTERUS    ? 2002  ? TOTAL KNEE ARTHROPLASTY Left 08/17/2020  ? Procedure: LEFT TOTAL KNEE ARTHROPLASTY;  Surgeon: Cammy Copa, MD;  Location: [facility] OR;  Service: Orthopedics;  Laterality: Left;  ? UPPER GASTROINTESTINAL ENDOSCOPY    ? UPPER GI ENDOSCOPY    ? WISDOM TOOTH EXTRACTION    ? ? ?Current Outpatient Medications  ?Medication Sig Dispense Refill  ? atorvastatin (LIPITOR) 20 MG tablet Take 1 tablet (20 mg total) by mouth daily. 90 tablet 1  ? Calcium-Magnesium-Zinc (CAL-MAG-ZINC PO) Take 1 tablet by mouth daily.    ? cholecalciferol (VITAMIN D3) 25 MCG (1000 UNIT) tablet Take 1,000 Units by mouth daily.    ? clonazePAM (KLONOPIN) 0.5 MG tablet Take 0.5 mg by mouth 2 (two) times daily as needed for anxiety.    ? Cyanocobalamin (VITAMIN B-12  PO) Take 1 tablet by mouth daily.    ? DULoxetine (CYMBALTA) 60 MG capsule Take 120 mg by mouth daily.    ? estradiol (ESTRACE) 0.5 MG tablet Take 1 tablet (0.5 mg total) by mouth daily. 30 tablet 11  ? lamoTRIgine (LAMICTAL) 25 MG tablet Take 75 mg by mouth daily.    ? lisinopril-hydrochlorothiazide (ZESTORETIC) 20-12.5 MG tablet Take 1 tablet by mouth daily. 90 tablet 1  ? pantoprazole (PROTONIX) 40 MG tablet Take 1 tablet (40 mg total) by mouth daily. 90 tablet 1  ? progesterone (PROMETRIUM) 100 MG capsule Take 1 capsule (100 mg total) by mouth at bedtime. 30 capsule 12  ? albuterol (VENTOLIN HFA) 108 (90 Base) MCG/ACT inhaler Inhale 2 puffs into the lungs every 6 (six) hours as needed for wheezing or shortness of breath.    ? APPLE CIDER VINEGAR PO Take 1 capsule by mouth 2 (two) times daily.    ? diclofenac (VOLTAREN) 75 MG EC tablet TAKE ONE TABLET BY MOUTH TWICE A DAY 60 tablet 2  ? ferrous sulfate 325 (65 FE) MG tablet Take 325 mg by mouth daily.    ? sulfaSALAzine (AZULFIDINE) 500 MG tablet TAKE TWO TABLETS BY MOUTH TWICE A DAY 120 tablet 2  ? SYMBICORT 160-4.5 MCG/ACT inhaler INHALE 2 PUFFS INTO THE LUNGS TWO TIMES A DAY AS NEEDED FOR SHORTNESS OF  BREATH/WHEEZING 10.2 g 5  ? ?Current Facility-Administered Medications  ?Medication Dose Route Frequency Provider Last Rate Last Admin  ? 0.9 %  sodium chloride infusion  500 mL Intravenous Once Tressia Danas, MD      ? methylPREDNISolone acetate (DEPO-MEDROL) injection 80 mg  80 mg Other Once Tyrell Antonio, MD      ? ? ?Allergies as of 09/27/2021  ? (No Known Allergies)  ? ? ?Family History  ?Problem Relation Age of Onset  ? Rheum arthritis Mother   ? Hypertension Mother   ? Depression Mother   ? Anxiety disorder Mother   ? Bipolar disorder Father   ? Mental illness Sister   ? Depression Sister   ? Congestive Heart Failure Maternal Grandfather   ? Colon cancer Neg Hx   ? Colon polyps Neg Hx   ? Esophageal cancer Neg Hx   ? Rectal cancer Neg Hx   ? Stomach  cancer Neg Hx   ? ? ? ?Physical Exam: ?General:   Alert,  well-nourished, pleasant and cooperative in NAD ?Head:  Normocephalic and atraumatic. ?Eyes:  Sclera clear, no icterus.   Conjunctiva pink. ?Mouth:  No deformity or lesions.   ?Neck:  Supple; no masses or thyromegaly. ?Lungs:  Clear throughout to auscultation.   No wheezes. ?Heart:  Regular rate and rhythm; no murmurs. ?Abdomen:  Soft, non-tender, nondistended, normal bowel sounds, no rebound or guarding.  ?Msk:  Symmetrical. No boney deformities ?LAD: No inguinal or umbilical LAD ?Extremities:  No clubbing or edema. ?Neurologic:  Alert and  oriented x4;  grossly nonfocal ?Skin:  No obvious rash or bruise. ?Psych:  Alert and cooperative. Normal mood and affect. ? ? ? ? ?Studies/Results: ?No results found. ? ? ? ?[name] L. Orvan Falconer, MD, MPH ?09/27/2021, 11:00 AM ? ? ? ?  ?

## 2021-09-27 NOTE — Progress Notes (Signed)
To pacu vss. Report to [email or web address] 

## 2021-09-27 NOTE — Op Note (Signed)
[facility] ?Patient Name: Kristi Decker ?Procedure Date: 09/27/2021 10:58 AM ?MRN: [id number] ?Endoscopist: Tressia Danas MD, MD ?Age: 46 ?Referring MD:  ?Date of Birth: [birth year] ?Gender: Female ?Account #: [id number] ?Procedure:                Colonoscopy ?Indications:              Screening for colorectal malignant neoplasm, This  ?                          is the patient's first colonoscopy ?                          Mother with colon polyps but they are not known to  ?                          be precancerous ?Medicines:                Monitored Anesthesia Care ?Procedure:                Pre-Anesthesia Assessment: ?                          - Prior to the procedure, a History and Physical  ?                          was performed, and patient medications and  ?                          allergies were reviewed. The patient's tolerance of  ?                          previous anesthesia was also reviewed. The risks  ?                          and benefits of the procedure and the sedation  ?                          options and risks were discussed with the patient.  ?                          All questions were answered, and informed consent  ?                          was obtained. Prior Anticoagulants: The patient has  ?                          taken no previous anticoagulant or antiplatelet  ?                          agents. ASA Grade Assessment: II - A patient with  ?                          mild systemic disease. After reviewing the risks  ?  and benefits, the patient was deemed in  ?                          satisfactory condition to undergo the procedure. ?                          After obtaining informed consent, the colonoscope  ?                          was passed under direct vision. Throughout the  ?                          procedure, the patient's blood pressure, pulse, and  ?                          oxygen saturations were monitored continuously. The  ?                           CF [id number] #[id number] was introduced through the anus  ?                          and advanced to the 3 cm into the ileum. A second  ?                          forward view of the right colon was performed. The  ?                          colonoscopy was performed without difficulty. The  ?                          patient tolerated the procedure well. The quality  ?                          of the bowel preparation was good. The terminal  ?                          ileum, ileocecal valve, appendiceal orifice, and  ?                          rectum were photographed. ?Scope In: 11:10:21 AM ?Scope Out: 11:25:24 AM ?Scope Withdrawal Time: 0 hours 11 minutes 7 seconds  ?Total Procedure Duration: 0 hours 15 minutes 3 seconds  ?Findings:                 The perianal and digital rectal examinations were  ?                          normal. ?                          The colon (entire examined portion) appeared normal. ?                          The terminal ileum appeared normal. ?  The exam was otherwise without abnormality on  ?                          direct and retroflexion views. ?Complications:            No immediate complications. ?Estimated Blood Loss:     Estimated blood loss: none. ?Impression:               - The entire examined colon is normal. ?                          - The examined portion of the ileum was normal. ?                          - The examination was otherwise normal on direct  ?                          and retroflexion views. ?                          - No specimens collected. ?Recommendation:           - Patient has a contact number available for  ?                          emergencies. The signs and symptoms of potential  ?                          delayed complications were discussed with the  ?                          patient. Return to normal activities tomorrow.  ?                          Written discharge instructions were provided to  the  ?                          patient. ?                          - Resume previous diet. ?                          - Continue present medications. ?                          - Repeat colonoscopy in 10 years for surveillance,  ?                          earlier with new symptoms. Would recommend  ?                          surveillance colonoscopy in 5 years if her mother's  ?                          polyps were adenomas. ?                          -  Emerging evidence supports eating a diet of  ?                          fruits, vegetables, grains, calcium, and yogurt  ?                          while reducing red meat and alcohol may reduce the  ?                          risk of colon cancer. ?                          - Thank you for allowing me to be involved in your  ?                          colon cancer prevention. ?Tressia Danas MD, MD ?09/27/2021 11:29:24 AM ?This report has been signed electronically. ?

## 2021-09-29 ENCOUNTER — Telehealth: Payer: Self-pay | Admitting: *Deleted

## 2021-09-29 NOTE — Telephone Encounter (Signed)
  Follow up Call-     09/27/2021   10:26 AM  Call back number  Post procedure Call Back phone  # [phone]  Permission to leave phone message Yes     Patient questions:  Do you have a fever, pain , or abdominal swelling? No. Pain Score  0 *  Have you tolerated food without any problems? Yes.    Have you been able to return to your normal activities? Yes.    Do you have any questions about your discharge instructions: Diet   No. Medications  No. Follow up visit  No.  Do you have questions or concerns about your Care? No.  Actions: * If pain score is 4 or above: No action needed, pain <4.

## 2021-10-03 NOTE — Procedures (Signed)
Lumbar Epidural Steroid Injection - Interlaminar Approach with Fluoroscopic Guidance  Patient: Kristi Decker      Date of Birth: April 16, 1976 MRN: [id number] PCP: Marcellina Millin      Visit Date: 09/13/2021   Universal Protocol:     Consent Given By: the patient  Position: PRONE  Additional Comments: Vital signs were monitored before and after the procedure. Patient was prepped and draped in the usual sterile fashion. The correct patient, procedure, and site was verified.   Injection Procedure Details:   Procedure diagnoses: Lumbar radiculopathy [M54.16]   Meds Administered:  Meds ordered this encounter  Medications   methylPREDNISolone acetate (DEPO-MEDROL) injection 80 mg     Laterality: Left  Location/Site:  L3-4  Needle: 3.5 in., 20 ga. Tuohy  Needle Placement: Paramedian epidural  Findings:   -Comments: Excellent flow of contrast into the epidural space.  Procedure Details: Using a paramedian approach from the side mentioned above, the region overlying the inferior lamina was localized under fluoroscopic visualization and the soft tissues overlying this structure were infiltrated with 4 ml. of 1% Lidocaine without Epinephrine. The Tuohy needle was inserted into the epidural space using a paramedian approach.   The epidural space was localized using loss of resistance along with counter oblique bi-planar fluoroscopic views.  After negative aspirate for air, blood, and CSF, a 2 ml. volume of Isovue-250 was injected into the epidural space and the flow of contrast was observed. Radiographs were obtained for documentation purposes.    The injectate was administered into the level noted above.   Additional Comments:  The patient tolerated the procedure well Dressing: 2 x 2 sterile gauze and Band-Aid    Post-procedure details: Patient was observed during the procedure. Post-procedure instructions were reviewed.  Patient left the clinic in stable  condition.

## 2021-10-03 NOTE — Progress Notes (Signed)
Kristi Decker - 46 y.o. female MRN [id number]  Date of birth: 20-Feb-1976  Office Visit Note: Visit Date: 09/13/2021 PCP: Jake Shark, PA-C Referred by: Lexine Baton  Subjective: Chief Complaint  Patient presents with   Lower Back - Pain   Left Leg - Pain   HPI:  Kristi Decker is a 46 y.o. female who comes in today at the request of Dr. Burnard Bunting for planned Left L3-4 Lumbar Interlaminar epidural steroid injection with fluoroscopic guidance.  The patient has failed conservative care including home exercise, medications, time and activity modification.  This injection will be diagnostic and hopefully therapeutic.  Please see requesting physician notes for further details and justification. MRI reviewed with images and spine model.  MRI reviewed in the note below.  She is having pain in the left hip and leg.  She does have left lateral recess narrowing at L4-5 disc protrusion.  However the worse looking levels at L2-3 with moderate narrowing and disc extrusion.  We will Hedger Betts with L3-4 interlaminar injection to try to get good spread of medication both above and below that level.  Depending on relief would look at potentially more directed transforaminal injection or repeat interlaminar injection.  ROS Otherwise per HPI.  Assessment & Plan: Visit Diagnoses:    ICD-10-CM   1. Lumbar radiculopathy  M54.16 XR C-ARM NO REPORT    Epidural Steroid injection    methylPREDNISolone acetate (DEPO-MEDROL) injection 80 mg      Plan: No additional findings.   Meds & Orders:  Meds ordered this encounter  Medications   methylPREDNISolone acetate (DEPO-MEDROL) injection 80 mg    Orders Placed This Encounter  Procedures   XR C-ARM NO REPORT   Epidural Steroid injection    Follow-up: Return if symptoms worsen or fail to improve.   Procedures: No procedures performed  Lumbar Epidural Steroid Injection - Interlaminar Approach with Fluoroscopic Guidance  Patient:  Kristi Decker      Date of Birth: July 20, 1975 MRN: [id number] PCP: Lexine Baton      Visit Date: 09/13/2021   Universal Protocol:     Consent Given By: the patient  Position: PRONE  Additional Comments: Vital signs were monitored before and after the procedure. Patient was prepped and draped in the usual sterile fashion. The correct patient, procedure, and site was verified.   Injection Procedure Details:   Procedure diagnoses: Lumbar radiculopathy [M54.16]   Meds Administered:  Meds ordered this encounter  Medications   methylPREDNISolone acetate (DEPO-MEDROL) injection 80 mg     Laterality: Left  Location/Site:  L3-4  Needle: 3.5 in., 20 ga. Tuohy  Needle Placement: Paramedian epidural  Findings:   -Comments: Excellent flow of contrast into the epidural space.  Procedure Details: Using a paramedian approach from the side mentioned above, the region overlying the inferior lamina was localized under fluoroscopic visualization and the soft tissues overlying this structure were infiltrated with 4 ml. of 1% Lidocaine without Epinephrine. The Tuohy needle was inserted into the epidural space using a paramedian approach.   The epidural space was localized using loss of resistance along with counter oblique bi-planar fluoroscopic views.  After negative aspirate for air, blood, and CSF, a 2 ml. volume of Isovue-250 was injected into the epidural space and the flow of contrast was observed. Radiographs were obtained for documentation purposes.    The injectate was administered into the level noted above.   Additional Comments:  The patient tolerated the procedure well  Dressing: 2 x 2 sterile gauze and Band-Aid    Post-procedure details: Patient was observed during the procedure. Post-procedure instructions were reviewed.  Patient left the clinic in stable condition.    Clinical History: MRI LUMBAR SPINE WITHOUT CONTRAST   TECHNIQUE: Multiplanar,  multisequence MR imaging of the lumbar spine was performed. No intravenous contrast was administered.   COMPARISON:  X-ray lumbar 06/23/2021 10/11/2015.   FINDINGS: Segmentation: Standard; the lowest formed disc space is designated L5-S1.   Alignment: There is significant levoscoliosis centered at L3, grossly similar to the radiographs from 2017. There is no antero or retrolisthesis.   Vertebrae: Background marrow signal is normal. There is degenerative endplate marrow signal abnormality at L3-L4, more so along the right side of the disc space due to the scoliotic curvature. There is no suspicious marrow signal abnormality or marrow edema.   Conus medullaris and cauda equina: Conus extends to the L1 level. Conus and cauda equina appear normal.   Paraspinal and other soft tissues: Unremarkable.   Disc levels:   There is advanced disc desiccation and narrowing at L3-L4 with associated degenerative endplate irregularity. There is mild desiccation and narrowing at L2-L3. The other disc heights are overall preserved.   T12-L1: No significant spinal canal or neural foraminal stenosis   L1-L2: There is a mild disc bulge without significant spinal canal or neural foraminal stenosis   L2-L3: There is a diffuse disc bulge with an inferiorly migrated right subarticular zone extrusion extending approximately 1.3 cm inferiorly from the level of the superior L3 endplate, and mild bilateral facet arthropathy resulting in moderate spinal canal stenosis with crowding of the cauda equina nerve roots and impingement of the traversing right L3 nerve root, and mild right and no significant left neural foraminal stenosis   L3-L4: There is a mild disc bulge and bilateral facet arthropathy with small effusions resulting in mild spinal canal stenosis and mild bilateral neural foraminal stenosis   L4-L5: There is a broad-based left subarticular zone/foraminal disc protrusion and bilateral facet  arthropathy with a small right effusion resulting in mild narrowing of the left subarticular zone without evidence of nerve root impingement, and moderate left and mild right neural foraminal stenosis   L5-S1: Mild bilateral facet arthropathy without significant spinal canal or neural foraminal stenosis.   IMPRESSION: 1. Significant levoscoliosis centered at L3, grossly similar to the radiographs from 2017. There is associated disc space narrowing and degenerative endplate change at L3-L4, more so along the right aspect of the disc space due to the scoliotic curvature. There is a mild disc bulge and bilateral facet arthropathy at this level resulting in mild spinal canal stenosis and mild bilateral neural foraminal stenosis. 2. Diffuse disc bulge with a superiorly migrated right subarticular zone extrusion at L2-L3 resulting in moderate spinal canal stenosis with crowding of the cauda equina nerve roots and impingement of the traversing right L3 nerve root. 3. Broad-based left subarticular zone/foraminal protrusion at L3-L4 resulting in moderate left neural foraminal stenosis.     Electronically Signed   By: Lesia HausenPeter  Noone M.D.   On: 08/10/2021 11:10     Objective:  VS:  HT:    WT:   BMI:     BP:(!) 134/91  HR:(!) 105bpm  TEMP: ( )  RESP:  Physical Exam Vitals and nursing note reviewed.  Constitutional:      General: She is not in acute distress.    Appearance: Normal appearance. She is not ill-appearing.  HENT:  Head: Normocephalic and atraumatic.     Right Ear: External ear normal.     Left Ear: External ear normal.  Eyes:     Extraocular Movements: Extraocular movements intact.  Cardiovascular:     Rate and Rhythm: Normal rate.     Pulses: Normal pulses.  Pulmonary:     Effort: Pulmonary effort is normal. No respiratory distress.  Abdominal:     General: There is no distension.     Palpations: Abdomen is soft.  Musculoskeletal:        General: Tenderness  present.     Cervical back: Neck supple.     Right lower leg: No edema.     Left lower leg: No edema.     Comments: Patient has good distal strength with no pain over the greater trochanters.  No clonus or focal weakness.  Skin:    Findings: No erythema, lesion or rash.  Neurological:     General: No focal deficit present.     Mental Status: She is alert and oriented to person, place, and time.     Sensory: No sensory deficit.     Motor: No weakness or abnormal muscle tone.     Coordination: Coordination normal.  Psychiatric:        Mood and Affect: Mood normal.        Behavior: Behavior normal.     Imaging: No results found.

## 2021-10-04 ENCOUNTER — Ambulatory Visit (INDEPENDENT_AMBULATORY_CARE_PROVIDER_SITE_OTHER): Payer: 59 | Admitting: Internal Medicine

## 2021-10-04 ENCOUNTER — Encounter: Payer: Self-pay | Admitting: Internal Medicine

## 2021-10-04 VITALS — BP 135/82 | HR 92 | Resp 15 | Ht 66.0 in | Wt 204.0 lb

## 2021-10-04 DIAGNOSIS — G8929 Other chronic pain: Secondary | ICD-10-CM

## 2021-10-04 DIAGNOSIS — Z79899 Other long term (current) drug therapy: Secondary | ICD-10-CM | POA: Diagnosis not present

## 2021-10-04 DIAGNOSIS — M1712 Unilateral primary osteoarthritis, left knee: Secondary | ICD-10-CM

## 2021-10-04 DIAGNOSIS — M1990 Unspecified osteoarthritis, unspecified site: Secondary | ICD-10-CM | POA: Diagnosis not present

## 2021-10-04 DIAGNOSIS — M797 Fibromyalgia: Secondary | ICD-10-CM | POA: Diagnosis not present

## 2021-10-04 DIAGNOSIS — M545 Low back pain, unspecified: Secondary | ICD-10-CM | POA: Insufficient documentation

## 2021-10-05 LAB — COMPLETE METABOLIC PANEL WITH GFR
AG Ratio: 1.6 (calc) (ref 1.0–2.5)
ALT: 29 U/L (ref 6–29)
AST: 21 U/L (ref 10–35)
Albumin: 4.2 g/dL (ref 3.6–5.1)
Alkaline phosphatase (APISO): 138 U/L — ABNORMAL HIGH (ref 31–125)
BUN/Creatinine Ratio: 29 (calc) — ABNORMAL HIGH (ref 6–22)
BUN: 14 mg/dL (ref 7–25)
CO2: 28 mmol/L (ref 20–32)
Calcium: 9.6 mg/dL (ref 8.6–10.2)
Chloride: 103 mmol/L (ref 98–110)
Creat: 0.49 mg/dL — ABNORMAL LOW (ref 0.50–0.99)
Globulin: 2.6 g/dL (calc) (ref 1.9–3.7)
Glucose, Bld: 90 mg/dL (ref 65–99)
Potassium: 4.3 mmol/L (ref 3.5–5.3)
Sodium: 139 mmol/L (ref 135–146)
Total Bilirubin: 0.3 mg/dL (ref 0.2–1.2)
Total Protein: 6.8 g/dL (ref 6.1–8.1)
eGFR: 117 mL/min/{1.73_m2} (ref >=60)

## 2021-10-05 LAB — CBC WITH DIFFERENTIAL/PLATELET
Absolute Monocytes: 429 cells/uL (ref 200–950)
Basophils Absolute: 29 cells/uL (ref 0–200)
Basophils Relative: 0.5 %
Eosinophils Absolute: 220 cells/uL (ref 15–500)
Eosinophils Relative: 3.8 %
HCT: 40.1 % (ref 35.0–45.0)
Hemoglobin: 12.7 g/dL (ref 11.7–15.5)
Lymphs Abs: 2506 cells/uL (ref 850–3900)
MCH: 30.6 pg (ref 27.0–33.0)
MCHC: 31.7 g/dL — ABNORMAL LOW (ref 32.0–36.0)
MCV: 96.6 fL (ref 80.0–100.0)
MPV: 9.5 fL (ref 7.5–12.5)
Monocytes Relative: 7.4 %
Neutro Abs: 2616 cells/uL (ref 1500–7800)
Neutrophils Relative %: 45.1 %
Platelets: 292 10*3/uL (ref 140–400)
RBC: 4.15 10*6/uL (ref 3.80–5.10)
RDW: 13.9 % (ref 11.0–15.0)
Total Lymphocyte: 43.2 %
WBC: 5.8 10*3/uL (ref 3.8–10.8)

## 2021-10-05 NOTE — Progress Notes (Signed)
Lab results look fine for continuing sulfasalazine and diclofenac.

## 2021-10-20 ENCOUNTER — Other Ambulatory Visit: Payer: Self-pay | Admitting: Internal Medicine

## 2021-10-20 DIAGNOSIS — M1990 Unspecified osteoarthritis, unspecified site: Secondary | ICD-10-CM

## 2021-10-20 NOTE — Telephone Encounter (Signed)
Please schedule patient a follow up visit. Patient due August 2023. Thanks!  

## 2021-10-20 NOTE — Telephone Encounter (Signed)
Next Visit: Due august 2023. Message sent to the front to schedule  Last Visit: 10/04/2021  Last Fill: 07/19/2021  DX: Inflammatory arthritis  Current Dose per office note 10/04/2021: sulfasalazine 1000 mg BID   Labs: 10/04/2021 Lab results look fine for continuing sulfasalazine and diclofenac.  Okay to refill SSZ?

## 2021-10-21 NOTE — Telephone Encounter (Signed)
LMOM for patient to call and schedule follow-up appointment.   °

## 2021-10-26 ENCOUNTER — Encounter: Payer: Self-pay | Admitting: Physical Medicine and Rehabilitation

## 2021-10-27 ENCOUNTER — Telehealth: Payer: Self-pay | Admitting: Physical Medicine and Rehabilitation

## 2021-10-27 NOTE — Telephone Encounter (Signed)
Pt called to set an appt. Please call pt at [phone].

## 2021-11-04 ENCOUNTER — Ambulatory Visit: Payer: 59 | Admitting: Allergy

## 2021-11-07 ENCOUNTER — Telehealth (INDEPENDENT_AMBULATORY_CARE_PROVIDER_SITE_OTHER): Payer: 59 | Admitting: Medical

## 2021-11-07 ENCOUNTER — Encounter: Payer: Self-pay | Admitting: Medical

## 2021-11-07 ENCOUNTER — Other Ambulatory Visit: Payer: Self-pay

## 2021-11-07 ENCOUNTER — Emergency Department (HOSPITAL_COMMUNITY): Payer: 59

## 2021-11-07 ENCOUNTER — Encounter (HOSPITAL_COMMUNITY): Payer: Self-pay | Admitting: Emergency Medicine

## 2021-11-07 ENCOUNTER — Telehealth: Payer: 59 | Admitting: Physician Assistant

## 2021-11-07 ENCOUNTER — Inpatient Hospital Stay (HOSPITAL_COMMUNITY)
Admission: EM | Admit: 2021-11-07 | Discharge: 2021-11-08 | DRG: 177 | Disposition: A | Discharge status: Home / Self Care | Payer: 59 | Attending: Family Medicine | Admitting: Family Medicine

## 2021-11-07 VITALS — Temp 98.6°F | Wt 204.0 lb

## 2021-11-07 DIAGNOSIS — Z818 Family history of other mental and behavioral disorders: Secondary | ICD-10-CM

## 2021-11-07 DIAGNOSIS — F419 Anxiety disorder, unspecified: Secondary | ICD-10-CM | POA: Diagnosis present

## 2021-11-07 DIAGNOSIS — I10 Essential (primary) hypertension: Secondary | ICD-10-CM | POA: Diagnosis present

## 2021-11-07 DIAGNOSIS — E876 Hypokalemia: Secondary | ICD-10-CM | POA: Diagnosis present

## 2021-11-07 DIAGNOSIS — G43909 Migraine, unspecified, not intractable, without status migrainosus: Secondary | ICD-10-CM | POA: Diagnosis present

## 2021-11-07 DIAGNOSIS — R0902 Hypoxemia: Secondary | ICD-10-CM | POA: Diagnosis not present

## 2021-11-07 DIAGNOSIS — U071 COVID-19: Secondary | ICD-10-CM | POA: Diagnosis not present

## 2021-11-07 DIAGNOSIS — E785 Hyperlipidemia, unspecified: Secondary | ICD-10-CM | POA: Diagnosis present

## 2021-11-07 DIAGNOSIS — J9601 Acute respiratory failure with hypoxia: Secondary | ICD-10-CM | POA: Diagnosis not present

## 2021-11-07 DIAGNOSIS — J4541 Moderate persistent asthma with (acute) exacerbation: Secondary | ICD-10-CM | POA: Diagnosis present

## 2021-11-07 DIAGNOSIS — J4540 Moderate persistent asthma, uncomplicated: Secondary | ICD-10-CM

## 2021-11-07 DIAGNOSIS — Z8261 Family history of arthritis: Secondary | ICD-10-CM

## 2021-11-07 DIAGNOSIS — F32A Depression, unspecified: Secondary | ICD-10-CM | POA: Diagnosis present

## 2021-11-07 DIAGNOSIS — Z6832 Body mass index (BMI) 32.0-32.9, adult: Secondary | ICD-10-CM

## 2021-11-07 DIAGNOSIS — R7303 Prediabetes: Secondary | ICD-10-CM | POA: Diagnosis present

## 2021-11-07 DIAGNOSIS — J441 Chronic obstructive pulmonary disease with (acute) exacerbation: Secondary | ICD-10-CM | POA: Diagnosis present

## 2021-11-07 DIAGNOSIS — G43009 Migraine without aura, not intractable, without status migrainosus: Secondary | ICD-10-CM | POA: Diagnosis present

## 2021-11-07 DIAGNOSIS — M1990 Unspecified osteoarthritis, unspecified site: Secondary | ICD-10-CM | POA: Diagnosis present

## 2021-11-07 DIAGNOSIS — Z79899 Other long term (current) drug therapy: Secondary | ICD-10-CM

## 2021-11-07 DIAGNOSIS — K219 Gastro-esophageal reflux disease without esophagitis: Secondary | ICD-10-CM | POA: Diagnosis not present

## 2021-11-07 DIAGNOSIS — Z7951 Long term (current) use of inhaled steroids: Secondary | ICD-10-CM

## 2021-11-07 DIAGNOSIS — F4310 Post-traumatic stress disorder, unspecified: Secondary | ICD-10-CM | POA: Diagnosis present

## 2021-11-07 DIAGNOSIS — Z8249 Family history of ischemic heart disease and other diseases of the circulatory system: Secondary | ICD-10-CM

## 2021-11-07 DIAGNOSIS — R062 Wheezing: Secondary | ICD-10-CM | POA: Insufficient documentation

## 2021-11-07 DIAGNOSIS — Z96652 Presence of left artificial knee joint: Secondary | ICD-10-CM | POA: Diagnosis present

## 2021-11-07 DIAGNOSIS — R0602 Shortness of breath: Principal | ICD-10-CM

## 2021-11-07 LAB — CBC WITH DIFFERENTIAL/PLATELET
Abs Immature Granulocytes: 0.01 10*3/uL (ref 0.00–0.07)
Basophils Absolute: 0 10*3/uL (ref 0.0–0.1)
Basophils Relative: 0 %
Eosinophils Absolute: 0 10*3/uL (ref 0.0–0.5)
Eosinophils Relative: 1 %
HCT: 37.8 % (ref 36.0–46.0)
Hemoglobin: 12.7 g/dL (ref 12.0–15.0)
Immature Granulocytes: 0 %
Lymphocytes Relative: 7 %
Lymphs Abs: 0.3 10*3/uL — ABNORMAL LOW (ref 0.7–4.0)
MCH: 31.1 pg (ref 26.0–34.0)
MCHC: 33.6 g/dL (ref 30.0–36.0)
MCV: 92.4 fL (ref 80.0–100.0)
Monocytes Absolute: 0.5 10*3/uL (ref 0.1–1.0)
Monocytes Relative: 11 %
Neutro Abs: 4 10*3/uL (ref 1.7–7.7)
Neutrophils Relative %: 81 %
Platelets: 275 10*3/uL (ref 150–400)
RBC: 4.09 MIL/uL (ref 3.87–5.11)
RDW: 13.9 % (ref 11.5–15.5)
WBC: 4.9 10*3/uL (ref 4.0–10.5)
nRBC: 0 % (ref 0.0–0.2)

## 2021-11-07 LAB — BASIC METABOLIC PANEL
Anion gap: 9 (ref 5–15)
BUN: 11 mg/dL (ref 6–20)
CO2: 23 mmol/L (ref 22–32)
Calcium: 9.8 mg/dL (ref 8.9–10.3)
Chloride: 105 mmol/L (ref 98–111)
Creatinine, Ser: 0.55 mg/dL (ref 0.44–1.00)
GFR, Estimated: >60 mL/min (ref >60)
Glucose, Bld: 122 mg/dL — ABNORMAL HIGH (ref 70–99)
Potassium: 3 mmol/L — ABNORMAL LOW (ref 3.5–5.1)
Sodium: 137 mmol/L (ref 135–145)

## 2021-11-07 LAB — RESP PANEL BY RT-PCR (FLU A&B, COVID) ARPGX2
Influenza A by PCR: NEGATIVE
Influenza B by PCR: NEGATIVE
SARS Coronavirus 2 by RT PCR: POSITIVE — AB

## 2021-11-07 LAB — I-STAT BETA HCG BLOOD, ED (MC, WL, AP ONLY): I-stat hCG, quantitative: <5 m[IU]/mL (ref <5)

## 2021-11-07 MED ORDER — ALBUTEROL SULFATE HFA 108 (90 BASE) MCG/ACT IN AERS
2.0000 | INHALATION_SPRAY | RESPIRATORY_TRACT | Status: DC | PRN
  Filled 2021-11-07: qty 6.7

## 2021-11-07 MED ORDER — SODIUM CHLORIDE 0.9 % IV BOLUS
1000.0000 mL | Freq: Once | INTRAVENOUS | Status: AC
Start: 2021-11-07 — End: 2021-11-07
  Administered 2021-11-07: 1000 mL via INTRAVENOUS

## 2021-11-07 MED ORDER — ALBUTEROL SULFATE HFA 108 (90 BASE) MCG/ACT IN AERS: 2.0000 | INHALATION_SPRAY | Freq: Four times a day (QID) | RESPIRATORY_TRACT | 0 refills | Status: DC | PRN

## 2021-11-07 MED ORDER — ONDANSETRON HCL 4 MG/2ML IJ SOLN: 4.0000 mg | Freq: Four times a day (QID) | INTRAMUSCULAR | Status: DC | PRN

## 2021-11-07 MED ORDER — AZITHROMYCIN 250 MG PO TABS: ORAL_TABLET | ORAL | 0 refills | Status: AC

## 2021-11-07 MED ORDER — MELATONIN 5 MG PO TABS
5.0000 mg | ORAL_TABLET | Freq: Once | ORAL | Status: AC
  Administered 2021-11-07: 5 mg via ORAL
  Filled 2021-11-07: qty 1

## 2021-11-07 MED ORDER — ONDANSETRON HCL 4 MG PO TABS: 4.0000 mg | ORAL_TABLET | Freq: Four times a day (QID) | ORAL | Status: DC | PRN

## 2021-11-07 MED ORDER — GUAIFENESIN-DM 100-10 MG/5ML PO SYRP: 10.0000 mL | ORAL_SOLUTION | ORAL | Status: DC | PRN

## 2021-11-07 MED ORDER — NIRMATRELVIR/RITONAVIR (PAXLOVID)TABLET
3.0000 | ORAL_TABLET | Freq: Two times a day (BID) | ORAL | Status: DC
  Administered 2021-11-07 – 2021-11-08 (×2): 3 via ORAL
  Filled 2021-11-07: qty 30

## 2021-11-07 MED ORDER — PREDNISONE 50 MG PO TABS
50.0000 mg | ORAL_TABLET | Freq: Every day | ORAL | Status: DC
Start: 2021-11-11 — End: 2021-11-08

## 2021-11-07 MED ORDER — METHYLPREDNISOLONE SODIUM SUCC 125 MG IJ SOLR
125.0000 mg | Freq: Once | INTRAMUSCULAR | Status: AC
Start: 2021-11-07 — End: 2021-11-07
  Administered 2021-11-07: 125 mg via INTRAVENOUS
  Filled 2021-11-07: qty 2

## 2021-11-07 MED ORDER — IPRATROPIUM-ALBUTEROL 0.5-2.5 (3) MG/3ML IN SOLN: 3.0000 mL | Freq: Once | RESPIRATORY_TRACT | Status: DC

## 2021-11-07 MED ORDER — METHYLPREDNISOLONE SODIUM SUCC 125 MG IJ SOLR
1.0000 mg/kg | Freq: Two times a day (BID) | INTRAMUSCULAR | Status: DC
  Administered 2021-11-07 – 2021-11-08 (×2): 92.5 mg via INTRAVENOUS
  Filled 2021-11-07 (×2): qty 2

## 2021-11-07 MED ORDER — LACTATED RINGERS IV SOLN: INTRAVENOUS | Status: DC

## 2021-11-07 MED ORDER — IPRATROPIUM-ALBUTEROL 20-100 MCG/ACT IN AERS
1.0000 | INHALATION_SPRAY | Freq: Four times a day (QID) | RESPIRATORY_TRACT | Status: DC
Start: 2021-11-07 — End: 2021-11-08
  Administered 2021-11-08 (×2): 1 via RESPIRATORY_TRACT
  Filled 2021-11-07: qty 4

## 2021-11-07 MED ORDER — IBUPROFEN 200 MG PO TABS
600.0000 mg | ORAL_TABLET | Freq: Once | ORAL | Status: AC
  Administered 2021-11-07: 600 mg via ORAL
  Filled 2021-11-07: qty 3

## 2021-11-07 MED ORDER — IPRATROPIUM-ALBUTEROL 0.5-2.5 (3) MG/3ML IN SOLN
3.0000 mL | Freq: Once | RESPIRATORY_TRACT | Status: AC
  Administered 2021-11-07: 3 mL via RESPIRATORY_TRACT
  Filled 2021-11-07: qty 3

## 2021-11-07 MED ORDER — POTASSIUM CHLORIDE CRYS ER 20 MEQ PO TBCR
40.0000 meq | EXTENDED_RELEASE_TABLET | Freq: Once | ORAL | Status: AC
  Administered 2021-11-07: 40 meq via ORAL
  Filled 2021-11-07: qty 2

## 2021-11-07 MED ORDER — IOHEXOL 350 MG/ML SOLN
75.0000 mL | Freq: Once | INTRAVENOUS | Status: AC | PRN
  Administered 2021-11-07: 75 mL via INTRAVENOUS

## 2021-11-07 MED ORDER — ONDANSETRON HCL 4 MG/2ML IJ SOLN
4.0000 mg | Freq: Once | INTRAMUSCULAR | Status: AC
  Administered 2021-11-07: 4 mg via INTRAVENOUS
  Filled 2021-11-07: qty 2

## 2021-11-07 MED ORDER — ENOXAPARIN SODIUM 40 MG/0.4ML IJ SOSY
40.0000 mg | PREFILLED_SYRINGE | INTRAMUSCULAR | Status: DC
  Administered 2021-11-07: 40 mg via SUBCUTANEOUS
  Filled 2021-11-07: qty 0.4

## 2021-11-07 MED ORDER — HYDROCOD POLI-CHLORPHE POLI ER 10-8 MG/5ML PO SUER
5.0000 mL | Freq: Two times a day (BID) | ORAL | Status: DC | PRN
  Administered 2021-11-08: 5 mL via ORAL
  Filled 2021-11-07: qty 5

## 2021-11-07 MED ORDER — ACETAMINOPHEN 325 MG PO TABS
650.0000 mg | ORAL_TABLET | Freq: Four times a day (QID) | ORAL | Status: DC | PRN
  Administered 2021-11-08: 650 mg via ORAL
  Filled 2021-11-07: qty 2

## 2021-11-07 NOTE — ED Triage Notes (Signed)
Pt arrives w/ c/o SHOB. Pt reports she tested positive for covid this morning. Pt reports body aches, SHOB, weakness. Hx COPD and asthma. Pt reports no relief w/ inhalers.

## 2021-11-07 NOTE — Progress Notes (Signed)
Subjective:     Patient ID: Kristi Decker, female   DOB: 12/01/1975, 46 y.o.   MRN: [id number]  This visit type was conducted due to national recommendations for restrictions regarding the COVID-19 Pandemic (e.g. social distancing) in an effort to limit this patient's exposure and mitigate transmission in our community.  Due to their co-morbid illnesses, this patient is at least at moderate risk for complications without adequate follow up.  This format is felt to be most appropriate for this patient at this time.    Documentation for virtual audio and video telecommunications through Josephine encounter:  The patient was located at home. The provider was located in the office. The patient did consent to this visit and is aware of possible charges through their insurance for this visit.  The other persons participating in this telemedicine service were none. Time spent on call was 20 minutes and in review of previous records 20 minutes total.  This virtual service is not related to other E/M service within previous 7 days.   HPI Chief Complaint  Patient presents with   Acute Visit    Virtual- Covid positive- trouble breathing. Body aches   Virtual consult for illness.  She notes that she was exposed to COVID last week.  She started having symptoms yesterday and tested positive for COVID.  Symptoms have been somewhat abrupt.  She notes 1 day history of cough, wheezing, shortness of breath, fever, body aches and chills, hurts so bad she cannot get out of bed, fever 101 currently, shaking, runny nose, some loose stool.  Feels very rundown.  She has a pulse ox device at home with her.  Currently her oxygen is running around 88 to 89% at rest on room air and pulse is running about 111.  No prior history of COVID infection.  She has underlying history of asthma.  She is currently using her Symbicort twice daily and using albuterol at least every 4 hours.  Prior to the last few days she had  not been having to use Symbicort regularly but just started back on this with the symptoms.    She feels weak and achy.  No other aggravating or relieving factors. No other complaint.   Past Medical History:  Diagnosis Date   Allergy    Anemia    on iron- past hx   Anxiety    Arthritis    left knee   Asthma    COPD (chronic obstructive pulmonary disease) (HCC)    Depression    GERD (gastroesophageal reflux disease)    High cholesterol    Per patient   Hypertension    Neuromuscular disorder (HCC)    right arm   PTSD (post-traumatic stress disorder)    Per patient   Refusal of blood product    Current Outpatient Medications on File Prior to Visit  Medication Sig Dispense Refill   albuterol (VENTOLIN HFA) 108 (90 Base) MCG/ACT inhaler Inhale 2 puffs into the lungs every 6 (six) hours as needed for wheezing or shortness of breath. 18 g 0   APPLE CIDER VINEGAR PO Take 1 capsule by mouth 2 (two) times daily.     atorvastatin (LIPITOR) 20 MG tablet Take 1 tablet (20 mg total) by mouth daily. 90 tablet 1   Calcium-Magnesium-Zinc (CAL-MAG-ZINC PO) Take 1 tablet by mouth daily.     cholecalciferol (VITAMIN D3) 25 MCG (1000 UNIT) tablet Take 1,000 Units by mouth daily.     clonazePAM (KLONOPIN) 0.5 MG tablet Take 0.5  mg by mouth 2 (two) times daily as needed for anxiety.     Cyanocobalamin (VITAMIN B-12 PO) Take 1 tablet by mouth daily.     diclofenac (VOLTAREN) 75 MG EC tablet TAKE ONE TABLET BY MOUTH TWICE A DAY 60 tablet 2   DULoxetine (CYMBALTA) 60 MG capsule Take 120 mg by mouth daily.     estradiol (ESTRACE) 0.5 MG tablet Take 1 tablet (0.5 mg total) by mouth daily. 30 tablet 11   ferrous sulfate 325 (65 FE) MG tablet Take 325 mg by mouth daily.     lamoTRIgine (LAMICTAL) 25 MG tablet Take 75 mg by mouth daily.     lisinopril-hydrochlorothiazide (ZESTORETIC) 20-12.5 MG tablet Take 1 tablet by mouth daily. 90 tablet 1   pantoprazole (PROTONIX) 40 MG tablet Take 1 tablet (40 mg  total) by mouth daily. 90 tablet 1   progesterone (PROMETRIUM) 100 MG capsule Take 1 capsule (100 mg total) by mouth at bedtime. 30 capsule 12   sulfaSALAzine (AZULFIDINE) 500 MG tablet TAKE TWO TABLETS BY MOUTH TWICE A DAY 120 tablet 2   SYMBICORT 160-4.5 MCG/ACT inhaler INHALE 2 PUFFS INTO THE LUNGS TWO TIMES A DAY AS NEEDED FOR SHORTNESS OF BREATH/WHEEZING 10.2 g 5   azithromycin (ZITHROMAX) 250 MG tablet Take 2 tablets on day 1, then 1 tablet daily on days 2 through 5 6 tablet 0   No current facility-administered medications on file prior to visit.     Review of Systems As in subjective    Objective:   Physical Exam Due to coronavirus pandemic stay at home measures, patient visit was virtual and they were not examined in person.   Temp 98.6 F (37 C)   Wt 204 lb (92.5 kg)   LMP 05/16/2019   BMI 32.93 kg/m   Wt Readings from Last 3 Encounters:  11/07/21 204 lb (92.5 kg)  10/04/21 204 lb (92.5 kg)  09/27/21 202 lb (91.6 kg)   General: Ill-appearing, lying in bed, congested in the nose and head No obvious wheezing or shortness of breath her pulse ox at home is showing 88 to 89% on room air with a pulse rate around 111.       Assessment:     Encounter Diagnoses  Name Primary?   COVID-19 virus infection Yes   Moderate persistent asthma without complication    Wheezing    Hypoxemia    Body mass index (BMI) of 32.0 to 32.9 in adult    High risk medication use        Plan:     We discussed her COVID-positive status, exposure last week and new onset of symptoms within the last 24 hours.  We discussed the fact that her oxygen level on room air is low and pulse rate elevated.  She is already on Symbicort twice daily and albuterol every 4 hours in the last 24 hours.  She has several comorbidities that put her at higher risk for serious illness with COVID infection including obesity, inflammatory arthritis on high risk medication, and on ACE and diuretic which puts her at risk  for dehydration.  I advised that she should probably be seen in the emergency department instead of treating this outpatient given the severity and risk.  She will have her husband take her to the emergency department.    She agreed with this plan and voiced understanding and agreement  Shuree was seen today for acute visit.  Diagnoses and all orders for this visit:  COVID-19 virus  infection  Moderate persistent asthma without complication  Wheezing  Hypoxemia  Body mass index (BMI) of 32.0 to 32.9 in adult  High risk medication use   Advised emergency dept follow up

## 2021-11-07 NOTE — ED Provider Notes (Signed)
Care of patient assumed from Dr. Rodena Medin at 3 PM.  This patient presents with shortness of breath and cough for the past 2 days.  She does have history of asthma and does report a positive home COVID test this morning.  At home, she utilized albuterol inhaler and 10 mg of prednisone.  Interventions thus far include NS bolus, DuoNeb, Solu-Medrol, and potassium chloride. Physical Exam  BP 125/80   Pulse 97   Temp 98.2 F (36.8 C) (Oral)   Resp 15   LMP 05/16/2019   SpO2 90%   Physical Exam Vitals and nursing note reviewed.  Constitutional:      General: She is not in acute distress.    Appearance: She is well-developed. She is not toxic-appearing or diaphoretic.  HENT:     Head: Normocephalic and atraumatic.     Mouth/Throat:     Mouth: Mucous membranes are moist.     Pharynx: Oropharynx is clear.  Eyes:     Conjunctiva/sclera: Conjunctivae normal.  Cardiovascular:     Rate and Rhythm: Regular rhythm. Tachycardia present.     Heart sounds: No murmur heard. Pulmonary:     Effort: Tachypnea and accessory muscle usage present. No respiratory distress.     Breath sounds: No decreased breath sounds, wheezing, rhonchi or rales.  Abdominal:     Palpations: Abdomen is soft.     Tenderness: There is no abdominal tenderness.  Musculoskeletal:        General: No swelling. Normal range of motion.     Cervical back: Normal range of motion and neck supple.     Right lower leg: No edema.     Left lower leg: No edema.  Skin:    General: Skin is warm and dry.     Coloration: Skin is not cyanotic or pale.  Neurological:     General: No focal deficit present.     Mental Status: She is alert and oriented to person, place, and time.     Cranial Nerves: No cranial nerve deficit.     Motor: No weakness.  Psychiatric:        Mood and Affect: Mood normal.        Behavior: Behavior normal.     Procedures  Procedures  ED Course / MDM    Medical Decision Making Amount and/or Complexity of  Data Reviewed Labs: ordered. Radiology: ordered.  Risk OTC drugs. Prescription drug management.   COVID-19 infection was confirmed on ED swab.  On assessment, patient resting on ED stretcher.  She has mildly increased work of breathing.  No wheezing is appreciated on lung auscultation.  Patient had SPO2 of 90% on room air.  She was placed on 2 L of supplemental oxygen.  She then underwent a trial of ambulation on room air and SPO2 dropped to 85%.  She was placed back on 2 L of supplemental oxygen.  At this point, SPO2 is down to 90% on 2 L.  She did undergo CTA of chest which did not show any PE.  There were no focal consolidations.  Patient to be admitted for new onset hypoxia.       Gloris Manchester, MD 11/07/21 [phone]

## 2021-11-07 NOTE — ED Provider Notes (Signed)
[facility]-EMERGENCY DEPT Provider Note   CSN: [id number] Arrival date & time: 11/07/21  1336     History  Chief Complaint  Patient presents with   Shortness of Breath   Covid Positive    Kristi Decker is a 46 y.o. female.  46 year old female with prior medical history as detailed below presents for evaluation.  Patient reports mild cough and congestion for 2 days.  Symptoms worse this morning.  Patient reports that she had no relief with albuterol inhalers at home.  She reports that her at home COVID test was positive this morning.  She reports prior COVID vaccination and booster administration.  She reports taking 10 mg of prednisone this AM.    The history is provided by the patient and medical records.  Shortness of Breath Severity:  Moderate Onset quality:  Gradual Duration:  2 days Timing:  Sporadic Progression:  Worsening Chronicity:  New      Home Medications Prior to Admission medications   Medication Sig Start Date End Date Taking? Authorizing Provider  albuterol (VENTOLIN HFA) 108 (90 Base) MCG/ACT inhaler Inhale 2 puffs into the lungs every 6 (six) hours as needed for wheezing or shortness of breath. 11/07/21   Burnette, Alessandra Bevels, PA-C  APPLE CIDER VINEGAR PO Take 1 capsule by mouth 2 (two) times daily.    [provider]  atorvastatin (LIPITOR) 20 MG tablet Take 1 tablet (20 mg total) by mouth daily. 07/15/21   Jake Shark, PA-C  azithromycin (ZITHROMAX) 250 MG tablet Take 2 tablets on day 1, then 1 tablet daily on days 2 through 5 11/07/21 11/12/21  Margaretann Loveless, PA-C  Calcium-Magnesium-Zinc (CAL-MAG-ZINC PO) Take 1 tablet by mouth daily.    [provider]  cholecalciferol (VITAMIN D3) 25 MCG (1000 UNIT) tablet Take 1,000 Units by mouth daily.    [provider]  clonazePAM (KLONOPIN) 0.5 MG tablet Take 0.5 mg by mouth 2 (two) times daily as needed for anxiety.    [provider]   Cyanocobalamin (VITAMIN B-12 PO) Take 1 tablet by mouth daily.    [provider]  diclofenac (VOLTAREN) 75 MG EC tablet TAKE ONE TABLET BY MOUTH TWICE A DAY 08/20/21   Rice, Jamesetta Orleans, MD  DULoxetine (CYMBALTA) 60 MG capsule Take 120 mg by mouth daily.    [provider]  estradiol (ESTRACE) 0.5 MG tablet Take 1 tablet (0.5 mg total) by mouth daily. 07/15/21 07/15/22  Marny Lowenstein, PA-C  ferrous sulfate 325 (65 FE) MG tablet Take 325 mg by mouth daily.    [provider]  lamoTRIgine (LAMICTAL) 25 MG tablet Take 75 mg by mouth daily.    [provider]  lisinopril-hydrochlorothiazide (ZESTORETIC) 20-12.5 MG tablet Take 1 tablet by mouth daily. 08/02/21   Jake Shark, PA-C  pantoprazole (PROTONIX) 40 MG tablet Take 1 tablet (40 mg total) by mouth daily. 08/02/21   Jake Shark, PA-C  progesterone (PROMETRIUM) 100 MG capsule Take 1 capsule (100 mg total) by mouth at bedtime. 01/28/21   Marny Lowenstein, PA-C  sulfaSALAzine (AZULFIDINE) 500 MG tablet TAKE TWO TABLETS BY MOUTH TWICE A DAY 10/20/21   Rice, Jamesetta Orleans, MD  SYMBICORT 160-4.5 MCG/ACT inhaler INHALE 2 PUFFS INTO THE LUNGS TWO TIMES A DAY AS NEEDED FOR SHORTNESS OF BREATH/WHEEZING 05/26/21   Ronnald Nian, MD      Allergies    Patient has no known allergies.    Review of Systems  Review of Systems  Respiratory:  Positive for shortness of breath.   All other systems reviewed and are negative.   Physical Exam Updated Vital Signs BP 131/81   Pulse (!) 103   Temp 98.2 F (36.8 C) (Oral)   Resp 15   LMP 05/16/2019   SpO2 93%  Physical Exam Vitals and nursing note reviewed.  Constitutional:      General: She is not in acute distress.    Appearance: Normal appearance. She is well-developed.  HENT:     Head: Normocephalic and atraumatic.  Eyes:     Conjunctiva/sclera: Conjunctivae normal.     Pupils: Pupils are equal, round, and reactive to light.  Cardiovascular:     Rate and  Rhythm: Normal rate and regular rhythm.     Heart sounds: Normal heart sounds.  Pulmonary:     Effort: Pulmonary effort is normal. No respiratory distress.     Comments: Trace expiratory wheezes in all lung fields Abdominal:     General: There is no distension.     Palpations: Abdomen is soft.     Tenderness: There is no abdominal tenderness.  Musculoskeletal:        General: No deformity. Normal range of motion.     Cervical back: Normal range of motion and neck supple.  Skin:    General: Skin is warm and dry.  Neurological:     General: No focal deficit present.     Mental Status: She is alert and oriented to person, place, and time.     ED Results / Procedures / Treatments   Labs (all labs ordered are listed, but only abnormal results are displayed) Labs Reviewed  CBC WITH DIFFERENTIAL/PLATELET - Abnormal; Notable for the following components:      Result Value   Lymphs Abs 0.3 (*)    All other components within normal limits  RESP PANEL BY RT-PCR (FLU A&B, COVID) ARPGX2  BASIC METABOLIC PANEL    EKG EKG Interpretation  Date/Time:  Monday November 07 2021 13:41:57 EDT Ventricular Rate:  102 PR Interval:  163 QRS Duration: 109 QT Interval:  371 QTC Calculation: 484 R Axis:   -22 Text Interpretation: Sinus tachycardia Borderline left axis deviation Low voltage, precordial leads Confirmed by Kristine Royal [id number]) on 11/07/2021 2:01:52 PM  Radiology No results found.  Procedures Procedures    Medications Ordered in ED Medications  albuterol (VENTOLIN HFA) 108 (90 Base) MCG/ACT inhaler 2 puff (has no administration in time range)  sodium chloride 0.9 % bolus 1,000 mL (has no administration in time range)  methylPREDNISolone sodium succinate (SOLU-MEDROL) 125 mg/2 mL injection 125 mg (has no administration in time range)    ED Course/ Medical Decision Making/ A&P                           Medical Decision Making Amount and/or Complexity of Data Reviewed Labs:  ordered. Radiology: ordered.  Risk Prescription drug management.    Medical Screen Complete  This patient presented to the ED with complaint of shortness of breath, wheezing.  This complaint involves an extensive number of treatment options. The initial differential diagnosis includes, but is not limited to, bronchospasm, metabolic abnormality, infection  This presentation is: Acute, Chronic, Self-Limited, Previously Undiagnosed, Uncertain Prognosis, Complicated, Systemic Symptoms, and Threat to Life/Bodily Function  46 year old female with history of asthma presents with increased bronchospasm symptoms.  Patient reports COVID-19 positive home test this morning.  She also has history  of obesity, inflammatory arthritis.  Patient without significant hypoxia on initial exam.  Screening labs to be obtained.  Patient is status post COVID vaccination.  If COVID-positive , patient may benefit from antiviral medication as well as steroid burst for treatment of asthma exacerbation.  Case signed over to Dr. Durwin Nora, oncoming EDP.    Co morbidities that complicated the patient's evaluation  Asthma, obesity, inflammatory arthritis (seronegative RA)   Additional history obtained:  Additional history obtained from Spouse External records from outside sources obtained and reviewed including prior ED visits and prior Inpatient records.    Lab Tests:  I ordered and personally interpreted labs.  The pertinent results include: CBC, BMP, COVID, flu   Imaging Studies ordered:  I ordered imaging studies including chest x-ray I independently visualized and interpreted obtained imaging which showed NAD I agree with the radiologist interpretation.   Cardiac Monitoring:  The patient was maintained on a cardiac monitor.  I personally viewed and interpreted the cardiac monitor which showed an underlying rhythm of: Sinus tach   Medicines ordered:  I ordered medication including  Solu-Medrol for bronchospasm Reevaluation of the patient after these medicines showed that the patient: improved  Problem List / ED Course:  Shortness of breath, bronchospasm, suspected COVID infection   Reevaluation:  After the interventions noted above, I reevaluated the patient and found that they have: improved   Disposition:  After consideration of the diagnostic results and the patients response to treatment, I feel that the patent would benefit from completion of ED workup.          Final Clinical Impression(s) / ED Diagnoses Final diagnoses:  SOB (shortness of breath)    Rx / DC Orders ED Discharge Orders     None         Wynetta Fines, MD 11/07/21 1438

## 2021-11-08 ENCOUNTER — Inpatient Hospital Stay (HOSPITAL_COMMUNITY): Payer: 59

## 2021-11-08 DIAGNOSIS — K219 Gastro-esophageal reflux disease without esophagitis: Secondary | ICD-10-CM

## 2021-11-08 DIAGNOSIS — I10 Essential (primary) hypertension: Secondary | ICD-10-CM | POA: Diagnosis not present

## 2021-11-08 DIAGNOSIS — J9601 Acute respiratory failure with hypoxia: Secondary | ICD-10-CM | POA: Diagnosis not present

## 2021-11-08 DIAGNOSIS — U071 COVID-19: Secondary | ICD-10-CM | POA: Diagnosis not present

## 2021-11-08 LAB — COMPREHENSIVE METABOLIC PANEL
ALT: 27 U/L (ref 0–44)
AST: 17 U/L (ref 15–41)
Albumin: 3.6 g/dL (ref 3.5–5.0)
Alkaline Phosphatase: 106 U/L (ref 38–126)
Anion gap: 7 (ref 5–15)
BUN: 11 mg/dL (ref 6–20)
CO2: 25 mmol/L (ref 22–32)
Calcium: 9.1 mg/dL (ref 8.9–10.3)
Chloride: 108 mmol/L (ref 98–111)
Creatinine, Ser: 0.53 mg/dL (ref 0.44–1.00)
GFR, Estimated: >60 mL/min (ref >60)
Glucose, Bld: 181 mg/dL — ABNORMAL HIGH (ref 70–99)
Potassium: 3.6 mmol/L (ref 3.5–5.1)
Sodium: 140 mmol/L (ref 135–145)
Total Bilirubin: 0.3 mg/dL (ref 0.3–1.2)
Total Protein: 6.6 g/dL (ref 6.5–8.1)

## 2021-11-08 LAB — CBC WITH DIFFERENTIAL/PLATELET
Abs Immature Granulocytes: 0.02 10*3/uL (ref 0.00–0.07)
Basophils Absolute: 0 10*3/uL (ref 0.0–0.1)
Basophils Relative: 0 %
Eosinophils Absolute: 0 10*3/uL (ref 0.0–0.5)
Eosinophils Relative: 0 %
HCT: 34.9 % — ABNORMAL LOW (ref 36.0–46.0)
Hemoglobin: 11.5 g/dL — ABNORMAL LOW (ref 12.0–15.0)
Immature Granulocytes: 0 %
Lymphocytes Relative: 14 %
Lymphs Abs: 0.8 10*3/uL (ref 0.7–4.0)
MCH: 31.6 pg (ref 26.0–34.0)
MCHC: 33 g/dL (ref 30.0–36.0)
MCV: 95.9 fL (ref 80.0–100.0)
Monocytes Absolute: 0.1 10*3/uL (ref 0.1–1.0)
Monocytes Relative: 2 %
Neutro Abs: 4.4 10*3/uL (ref 1.7–7.7)
Neutrophils Relative %: 84 %
Platelets: 268 10*3/uL (ref 150–400)
RBC: 3.64 MIL/uL — ABNORMAL LOW (ref 3.87–5.11)
RDW: 14.3 % (ref 11.5–15.5)
WBC: 5.3 10*3/uL (ref 4.0–10.5)
nRBC: 0 % (ref 0.0–0.2)

## 2021-11-08 LAB — HIV ANTIBODY (ROUTINE TESTING W REFLEX): HIV Screen 4th Generation wRfx: NONREACTIVE

## 2021-11-08 LAB — MAGNESIUM: Magnesium: 2.2 mg/dL (ref 1.7–2.4)

## 2021-11-08 MED ORDER — SULFASALAZINE 500 MG PO TABS: 500.0000 mg | ORAL_TABLET | Freq: Two times a day (BID) | ORAL | 0 refills | Status: DC

## 2021-11-08 MED ORDER — PREDNISONE 10 MG PO TABS: ORAL_TABLET | ORAL | 0 refills | Status: DC

## 2021-11-08 MED ORDER — SULFASALAZINE 500 MG PO TABS: 1000.0000 mg | ORAL_TABLET | Freq: Two times a day (BID) | ORAL | 2 refills | Status: DC

## 2021-11-08 MED ORDER — ORAL CARE MOUTH RINSE: 15.0000 mL | OROMUCOSAL | Status: DC | PRN

## 2021-11-08 MED ORDER — NIRMATRELVIR/RITONAVIR (PAXLOVID)TABLET: 3.0000 | ORAL_TABLET | Freq: Two times a day (BID) | ORAL | 0 refills | Status: AC

## 2021-11-08 NOTE — Progress Notes (Signed)
SATURATION QUALIFICATIONS: (This note is used to comply with regulatory documentation for home oxygen)  Patient Saturations on Room Air at Rest = 90%  Patient Saturations on Room Air while Ambulating = 94%  Patient Saturations on 4 Liters of oxygen while Ambulating = 97%  Please briefly explain why patient needs home oxygen:

## 2021-11-09 ENCOUNTER — Telehealth: Payer: Self-pay

## 2021-11-09 NOTE — Telephone Encounter (Signed)
Thank you :)

## 2021-11-09 NOTE — Telephone Encounter (Signed)
I called pt. Per my TOC report she was recently in the hospital for covid. She stated she is doing better than she was the other day when she had a virtual with you. They gave her IV fluids and medications to take at home which she is doing. She is also trying to get as much rest as possible.

## 2021-11-11 ENCOUNTER — Encounter: Payer: Self-pay | Admitting: Internal Medicine

## 2021-11-14 ENCOUNTER — Ambulatory Visit (INDEPENDENT_AMBULATORY_CARE_PROVIDER_SITE_OTHER): Payer: 59 | Admitting: Physician Assistant

## 2021-11-14 ENCOUNTER — Encounter: Payer: Self-pay | Admitting: Physician Assistant

## 2021-11-14 VITALS — BP 120/70 | HR 82 | Temp 98.5°F | Ht 66.0 in | Wt 202.8 lb

## 2021-11-14 DIAGNOSIS — R0602 Shortness of breath: Secondary | ICD-10-CM | POA: Diagnosis not present

## 2021-11-14 DIAGNOSIS — I10 Essential (primary) hypertension: Secondary | ICD-10-CM

## 2021-11-14 DIAGNOSIS — Z8616 Personal history of COVID-19: Secondary | ICD-10-CM | POA: Diagnosis not present

## 2021-11-14 DIAGNOSIS — K219 Gastro-esophageal reflux disease without esophagitis: Secondary | ICD-10-CM

## 2021-11-14 DIAGNOSIS — Z20822 Contact with and (suspected) exposure to covid-19: Secondary | ICD-10-CM

## 2021-11-14 DIAGNOSIS — E782 Mixed hyperlipidemia: Secondary | ICD-10-CM

## 2021-11-14 MED ORDER — LISINOPRIL-HYDROCHLOROTHIAZIDE 20-12.5 MG PO TABS
1.0000 | ORAL_TABLET | Freq: Every day | ORAL | 1 refills | Status: DC
Start: 2021-11-14 — End: 2022-01-19

## 2021-11-14 MED ORDER — PANTOPRAZOLE SODIUM 40 MG PO TBEC: 40.0000 mg | DELAYED_RELEASE_TABLET | Freq: Every day | ORAL | 1 refills | Status: DC

## 2021-11-14 MED ORDER — ATORVASTATIN CALCIUM 20 MG PO TABS: 20.0000 mg | ORAL_TABLET | Freq: Every day | ORAL | 1 refills | Status: DC

## 2021-11-14 NOTE — Progress Notes (Signed)
Established Patient Office Visit  Subjective:  Patient ID: Kristi Decker, female    DOB: [birth year]  Age: 46 y.o. MRN: [id number]  CC:  Chief Complaint  Patient presents with   Follow-up    Follow up on Covid. She was diagnosed 2 weeks ago. She is still having SOB with small tasks. Wants to see if she could get portable oxygen.    HPI Kristi Decker presents for follow up from hospital admission at [facility] from 11/07/2021 to 11/08/2021; was diagnosed with Covid-19 on 11/07/2021 and finished her paxlovid and prednisone; is still having a lot of shortness of breath with occasional productive cough; is able to sleep at night; appetite is okay; denies nausea and vomiting, denies fever _________________________________________________________  Eric Form Admission -  Admit date:     11/07/2021  Discharge date: 11/08/21  Discharge Physician: Meredeth Ide    PCP: Jake Shark, PA-C    Recommendations at discharge:    Follow-up PCP in 1 week   Discharge Diagnoses: Principal Problem:   Acute hypoxemic respiratory failure (HCC) Active Problems:   Prediabetes   Moderate persistent asthma without complication   Migraine without aura and without status migrainosus, not intractable   GERD without esophagitis   Essential hypertension   Anxiety and depression   COVID-19 virus infection   Hypokalemia   Resolved Problems:   * No resolved hospital problems. *   Hospital Course: 46 y.o. female with medical history significant of COPD, essential hypertension, osteoarthritis, hyperlipidemia, GERD, who presents to the ER with shortness of breath and cough.  Symptoms have been going on for 2 days.  Got worse this morning.  She did at home testing that came back positive for COVID-19.  She has had prior COVID-19 vaccination and booster shots.  Patient took her inhalers but no relief.  Was seen in the ER where repeat testing showed patient to be COVID-19 positive.  She is hypoxic  requiring 2 L of oxygen at the moment.  Chest x-ray and CT angiogram of the chest showed no infiltrates.  Also no pneumonia at this point she is being admitted with COVID-19 infection with hypoxemia.  Probably triggered her asthma COPD exacerbation.   Assessment and Plan:   COVID-19 infection with hypoxia: -Significantly improved from -Patient is not requiring oxygen anymore -Oxygen saturation on ambulation 94% on room air -Started on Paxlovid, prednisone -Repeat chest x-ray this morning showed no active disease -We will discharge on Paxlovid and prednisone taper _____________________________________________________________   Outpatient Medications Prior to Visit  Medication Sig Dispense Refill   albuterol (VENTOLIN HFA) 108 (90 Base) MCG/ACT inhaler Inhale 2 puffs into the lungs every 6 (six) hours as needed for wheezing or shortness of breath. 18 g 0   APPLE CIDER VINEGAR PO Take 1 capsule by mouth 2 (two) times daily.     Calcium-Magnesium-Zinc (CAL-MAG-ZINC PO) Take 1 tablet by mouth daily.     cholecalciferol (VITAMIN D3) 25 MCG (1000 UNIT) tablet Take 1,000 Units by mouth daily.     clonazePAM (KLONOPIN) 0.5 MG tablet Take 0.5 mg by mouth 2 (two) times daily as needed for anxiety.     Cyanocobalamin (VITAMIN B-12 PO) Take 1 tablet by mouth daily.     DULoxetine (CYMBALTA) 60 MG capsule Take 120 mg by mouth daily.     estradiol (ESTRACE) 0.5 MG tablet Take 1 tablet (0.5 mg total) by mouth daily. 30 tablet 11   ferrous sulfate 325 (65 FE) MG tablet Take  325 mg by mouth daily.     lamoTRIgine (LAMICTAL) 25 MG tablet Take 75 mg by mouth daily.     nirmatrelvir & ritonavir (PAXLOVID, 150/100,) 10 x 150 MG & 10 x 100MG  TBPK Take by mouth.     progesterone (PROMETRIUM) 100 MG capsule Take 1 capsule (100 mg total) by mouth at bedtime. 30 capsule 12   [START ON 11/16/2021] sulfaSALAzine (AZULFIDINE) 500 MG tablet Take 2 tablets (1,000 mg total) by mouth 2 (two) times daily. 120 tablet 2    Suvorexant (BELSOMRA) 15 MG TABS Take 15 mg by mouth at bedtime.     SYMBICORT 160-4.5 MCG/ACT inhaler INHALE 2 PUFFS INTO THE LUNGS TWO TIMES A DAY AS NEEDED FOR SHORTNESS OF BREATH/WHEEZING (Patient taking differently: Inhale 2 puffs into the lungs 2 (two) times daily as needed (for shortness of breath and wheezing).) 10.2 g 5   atorvastatin (LIPITOR) 20 MG tablet Take 1 tablet (20 mg total) by mouth daily. 90 tablet 1   diclofenac (VOLTAREN) 75 MG EC tablet TAKE ONE TABLET BY MOUTH TWICE A DAY (Patient taking differently: Take 75 mg by mouth 2 (two) times daily.) 60 tablet 2   lisinopril-hydrochlorothiazide (ZESTORETIC) 20-12.5 MG tablet Take 1 tablet by mouth daily. 90 tablet 1   pantoprazole (PROTONIX) 40 MG tablet Take 1 tablet (40 mg total) by mouth daily. 90 tablet 1   predniSONE (DELTASONE) 10 MG tablet Prednisone 40 mg po daily x 1 day then Prednisone 30 mg po daily x 2 day then Prednisone 20 mg po daily x 2 day then Prednisone 10 mg daily x 2 day then stop... 15 tablet 0   sulfaSALAzine (AZULFIDINE) 500 MG tablet Take 1 tablet (500 mg total) by mouth 2 (two) times daily for 4 days. Start taking Sulfasalazine 1 gm po BID, 3 days after completing Paxovid 8 tablet 0   No facility-administered medications prior to visit.    No Known Allergies  Patient Care Team: Lexine BatonWilliams, Adriell Polansky B, PA-C as PCP - General (Physician Assistant) Fuller Planice, Christopher W, MD as Consulting Physician (Rheumatology)  ROS Review of Systems  Constitutional:  Negative for activity change and chills.  HENT:  Negative for congestion and voice change.   Eyes:  Negative for pain and redness.  Respiratory:  Positive for cough and shortness of breath. Negative for wheezing.   Cardiovascular:  Negative for chest pain.  Gastrointestinal:  Negative for constipation, diarrhea, nausea and vomiting.  Endocrine: Negative for polyuria.  Genitourinary:  Negative for frequency.  Skin:  Negative for color change and rash.   Allergic/Immunologic: Negative for immunocompromised state.  Neurological:  Negative for dizziness.  Psychiatric/Behavioral:  Negative for agitation.       Objective:    Physical Exam Vitals and nursing note reviewed.  Constitutional:      General: She is not in acute distress.    Appearance: She is normal weight. She is not ill-appearing.  HENT:     Head: Normocephalic and atraumatic.     Right Ear: External ear normal.     Left Ear: External ear normal.  Eyes:     Extraocular Movements: Extraocular movements intact.     Conjunctiva/sclera: Conjunctivae normal.     Pupils: Pupils are equal, round, and reactive to light.  Cardiovascular:     Rate and Rhythm: Normal rate and regular rhythm.     Pulses: Normal pulses.     Heart sounds: Normal heart sounds.  Pulmonary:     Effort: Pulmonary effort is normal.  Breath sounds: Normal breath sounds. No wheezing.  Abdominal:     General: Bowel sounds are normal.     Palpations: Abdomen is soft.  Musculoskeletal:        General: Normal range of motion.     Cervical back: Normal range of motion.  Skin:    General: Skin is warm and dry.  Neurological:     Mental Status: She is alert and oriented to person, place, and time.  Psychiatric:        Mood and Affect: Mood normal.     BP 120/70   Pulse 82   Temp 98.5 F (36.9 C)   Ht 5\' 6"  (1.676 m)   Wt 202 lb 12.8 oz (92 kg)   LMP 05/16/2019   SpO2 95%   BMI 32.73 kg/m   Wt Readings from Last 3 Encounters:  11/14/21 202 lb 12.8 oz (92 kg)  11/07/21 204 lb 2.3 oz (92.6 kg)  11/07/21 204 lb (92.5 kg)    Results for orders placed or performed during the hospital encounter of 11/07/21  Resp Panel by RT-PCR (Flu A&B, Covid) Anterior Nasal Swab   Specimen: Anterior Nasal Swab  Result Value Ref Range   SARS Coronavirus 2 by RT PCR POSITIVE (A) NEGATIVE   Influenza A by PCR NEGATIVE NEGATIVE   Influenza B by PCR NEGATIVE NEGATIVE  Basic metabolic panel  Result Value  Ref Range   Sodium 137 135 - 145 mmol/L   Potassium 3.0 (L) 3.5 - 5.1 mmol/L   Chloride 105 98 - 111 mmol/L   CO2 23 22 - 32 mmol/L   Glucose, Bld 122 (H) 70 - 99 mg/dL   BUN 11 6 - 20 mg/dL   Creatinine, Ser 11/09/21 0.44 - 1.00 mg/dL   Calcium 9.8 8.9 - 0.17 mg/dL   GFR, Estimated 51.0 >25 mL/min   Anion gap 9 5 - 15  CBC with Differential  Result Value Ref Range   WBC 4.9 4.0 - 10.5 K/uL   RBC 4.09 3.87 - 5.11 MIL/uL   Hemoglobin 12.7 12.0 - 15.0 g/dL   HCT >85 27.7 - 82.4 %   MCV 92.4 80.0 - 100.0 fL   MCH 31.1 26.0 - 34.0 pg   MCHC 33.6 30.0 - 36.0 g/dL   RDW 23.5 36.1 - 44.3 %   Platelets 275 150 - 400 K/uL   nRBC 0.0 0.0 - 0.2 %   Neutrophils Relative % 81 %   Neutro Abs 4.0 1.7 - 7.7 K/uL   Lymphocytes Relative 7 %   Lymphs Abs 0.3 (L) 0.7 - 4.0 K/uL   Monocytes Relative 11 %   Monocytes Absolute 0.5 0.1 - 1.0 K/uL   Eosinophils Relative 1 %   Eosinophils Absolute 0.0 0.0 - 0.5 K/uL   Basophils Relative 0 %   Basophils Absolute 0.0 0.0 - 0.1 K/uL   Immature Granulocytes 0 %   Abs Immature Granulocytes 0.01 0.00 - 0.07 K/uL  HIV Antibody (routine testing w rflx)  Result Value Ref Range   HIV Screen 4th Generation wRfx Non Reactive Non Reactive  Magnesium  Result Value Ref Range   Magnesium 2.2 1.7 - 2.4 mg/dL  Comprehensive metabolic panel  Result Value Ref Range   Sodium 140 135 - 145 mmol/L   Potassium 3.6 3.5 - 5.1 mmol/L   Chloride 108 98 - 111 mmol/L   CO2 25 22 - 32 mmol/L   Glucose, Bld 181 (H) 70 - 99 mg/dL   BUN  11 6 - 20 mg/dL   Creatinine, Ser 1.24 0.44 - 1.00 mg/dL   Calcium 9.1 8.9 - 58.0 mg/dL   Total Protein 6.6 6.5 - 8.1 g/dL   Albumin 3.6 3.5 - 5.0 g/dL   AST 17 15 - 41 U/L   ALT 27 0 - 44 U/L   Alkaline Phosphatase 106 38 - 126 U/L   Total Bilirubin 0.3 0.3 - 1.2 mg/dL   GFR, Estimated >99 >83 mL/min   Anion gap 7 5 - 15  CBC with Differential/Platelet  Result Value Ref Range   WBC 5.3 4.0 - 10.5 K/uL   RBC 3.64 (L) 3.87 - 5.11 MIL/uL    Hemoglobin 11.5 (L) 12.0 - 15.0 g/dL   HCT 38.2 (L) 50.5 - 39.7 %   MCV 95.9 80.0 - 100.0 fL   MCH 31.6 26.0 - 34.0 pg   MCHC 33.0 30.0 - 36.0 g/dL   RDW 67.3 41.9 - 37.9 %   Platelets 268 150 - 400 K/uL   nRBC 0.0 0.0 - 0.2 %   Neutrophils Relative % 84 %   Neutro Abs 4.4 1.7 - 7.7 K/uL   Lymphocytes Relative 14 %   Lymphs Abs 0.8 0.7 - 4.0 K/uL   Monocytes Relative 2 %   Monocytes Absolute 0.1 0.1 - 1.0 K/uL   Eosinophils Relative 0 %   Eosinophils Absolute 0.0 0.0 - 0.5 K/uL   Basophils Relative 0 %   Basophils Absolute 0.0 0.0 - 0.1 K/uL   Immature Granulocytes 0 %   Abs Immature Granulocytes 0.02 0.00 - 0.07 K/uL  I-Stat Beta hCG blood, ED ([facility], [facility], [facility] only)  Result Value Ref Range   I-stat hCG, quantitative <5.0 <5 mIU/mL   Comment 3               The 10-year ASCVD risk score (Arnett DK, et al., 2019) is: 1.6%    Assessment & Plan:   Problem List Items Addressed This Visit       Cardiovascular and Mediastinum   Essential hypertension    Controlled, eat a low salt diet, do not add any salt to food when cooking, avoid processed foods, avoid fried foods       Relevant Medications   atorvastatin (LIPITOR) 20 MG tablet   lisinopril-hydrochlorothiazide (ZESTORETIC) 20-12.5 MG tablet     Digestive   GERD without esophagitis    stable, avoid stomach irritants (tomatoes, oranges, lemons, limes, spicy/greasy food)       Relevant Medications   pantoprazole (PROTONIX) 40 MG tablet     Other   Mixed hyperlipidemia    eat a low fat diet, increase fiber intake (Benefiber or Metamucil, Cherrios,  oatmeal, beans, nuts, fruits and vegetables), limit saturated fats (in fried foods, red meat), can add OTC fish oil supplement, eat fish with Omega-3 fatty acids like salmon and tuna, exercise for 30 minutes 3 - 5 times a week, drink 8 - 10 glasses of water a day        Relevant Medications   atorvastatin (LIPITOR) 20 MG tablet   lisinopril-hydrochlorothiazide  (ZESTORETIC) 20-12.5 MG tablet   Other Visit Diagnoses     Personal history of COVID-19    -  Primary   Relevant Orders   Ambulatory referral to Pulmonology - continue current management, If symptoms get worse, please call the office for a follow up appointment if available, otherwise go to Urgent Care, call 911 / EMS, or go to the Emergency Department for further  evaluation.    Shortness of breath with exposure to COVID-19 virus       Relevant Orders   Ambulatory referral to Pulmonology - continue current management       Meds ordered this encounter  Medications   pantoprazole (PROTONIX) 40 MG tablet    Sig: Take 1 tablet (40 mg total) by mouth daily.    Dispense:  90 tablet    Refill:  1    Order Specific Question:   Supervising Provider    Answer:   Ronnald Nian [[id number]]   atorvastatin (LIPITOR) 20 MG tablet    Sig: Take 1 tablet (20 mg total) by mouth daily.    Dispense:  90 tablet    Refill:  1    Order Specific Question:   Supervising Provider    Answer:   Ronnald Nian [[id number]]   lisinopril-hydrochlorothiazide (ZESTORETIC) 20-12.5 MG tablet    Sig: Take 1 tablet by mouth daily.    Dispense:  90 tablet    Refill:  1    Order Specific Question:   Supervising Provider    Answer:   Ronnald Nian [[id number]]    Follow-up: Return for Return as Already Scheduled.    Jake Shark, PA-C

## 2021-11-14 NOTE — Assessment & Plan Note (Signed)
Controlled, eat a low salt diet, do not add any salt to food when cooking, avoid processed foods, avoid fried foods  

## 2021-11-14 NOTE — Assessment & Plan Note (Signed)
stable,  avoid stomach irritants (tomatoes, oranges, lemons, limes, spicy/greasy food)  

## 2021-11-14 NOTE — Assessment & Plan Note (Signed)
eat a low fat diet, increase fiber intake (Benefiber or Metamucil, Cherrios,  oatmeal, beans, nuts, fruits and vegetables), limit saturated fats (in fried foods, red meat), can add OTC fish oil supplement, eat fish with Omega-3 fatty acids like salmon and tuna, exercise for 30 minutes 3 - 5 times a week, drink 8 - 10 glasses of water a day   

## 2021-11-14 NOTE — Patient Instructions (Signed)
You will get a call to schedule an appointment with Pulmonology  If symptoms get worse, please call the office for a follow up appointment if available, otherwise go to Urgent Care, call 911 / EMS, or go to the Emergency Department for further evaluation.

## 2021-11-17 ENCOUNTER — Encounter: Payer: Self-pay | Admitting: Orthopedic Surgery

## 2021-11-17 ENCOUNTER — Ambulatory Visit: Payer: 59 | Admitting: Surgical

## 2021-11-17 DIAGNOSIS — M545 Low back pain, unspecified: Secondary | ICD-10-CM

## 2021-11-17 DIAGNOSIS — M5416 Radiculopathy, lumbar region: Secondary | ICD-10-CM | POA: Diagnosis not present

## 2021-11-17 NOTE — Progress Notes (Signed)
Office Visit Note   Patient: Kristi Decker           Date of Birth: Feb 24, 1976           MRN: [id number] Visit Date: 11/17/2021 Requested by: Jake Shark, PA-C [address] [place],  [place] [address] PCP: Lexine Baton  Subjective: Chief Complaint  Patient presents with   Lower Back - Follow-up    HPI: Kristi Decker is a 46 y.o. female who presents to the office complaining of low back pain.  Patient states that she had ESI with Dr. Alvester Morin on 09/13/2021.  Did not really help her at all.  She recently had COVID and had to be hospitalized and since she has been off of work and not as active due to her hospitalization, her back pain has improved.  She is going back to work today and feels like her pain will flareup.  Describes mostly low back pain with occasional left radicular pain down to her left knee...                ROS: All systems reviewed are negative as they relate to the chief complaint within the history of present illness.  Patient denies fevers or chills.  Assessment & Plan: Visit Diagnoses:  1. Lumbar radiculopathy   2. Low back pain, unspecified back pain laterality, unspecified chronicity, unspecified whether sciatica present     Plan: Patient is a 46 year old female who presents for reevaluation of low back pain.  Had ESI with Dr. Alvester Morin on 09/13/2021.  ESI did not really help her at all.  She is in a pretty good spot right now as far as how she is handling her pain.  However, she is returning to work today so she may have some flaring of her pain.  Her job allows her to sit at times.  This is helpful for her.  Recommended she go to physical therapy 1 time per week for 4 weeks to work on lumbar spine physical therapy exercises and design a home exercise program to keep up with to help her back pain in the future.  Also plan to try another ESI with Dr. Alvester Morin likely to see if a different approach will give her more relief. F/u prn   Follow-Up  Instructions: No follow-ups on file.   Orders:  Orders Placed This Encounter  Procedures   Ambulatory referral to Physical Therapy   Ambulatory referral to Physical Medicine Rehab   No orders of the defined types were placed in this encounter.     Procedures: No procedures performed   Clinical Data: No additional findings.  Objective: Vital Signs: LMP 05/16/2019   Physical Exam:  Constitutional: Patient appears well-developed HEENT:  Head: Normocephalic Eyes:EOM are normal Neck: Normal range of motion Cardiovascular: Normal rate Pulmonary/chest: Effort normal Neurologic: Patient is alert Skin: Skin is warm Psychiatric: Patient has normal mood and affect  Ortho Exam: Ortho exam demonstrates a/5 motor strength of bilateral hip flexion, quadricep, hamstring, dorsiflexion Complan flexion.  No pain with hip range of motion.  Negative straight leg raise bilaterally.  Mild tenderness throughout the axial lumbar spine.  Ambulates without antalgia.  Specialty Comments:  MRI LUMBAR SPINE WITHOUT CONTRAST   TECHNIQUE: Multiplanar, multisequence MR imaging of the lumbar spine was performed. No intravenous contrast was administered.   COMPARISON:  X-ray lumbar 06/23/2021 10/11/2015.   FINDINGS: Segmentation: Standard; the lowest formed disc space is designated L5-S1.   Alignment: There is significant levoscoliosis centered at  L3, grossly similar to the radiographs from 2017. There is no antero or retrolisthesis.   Vertebrae: Background marrow signal is normal. There is degenerative endplate marrow signal abnormality at L3-L4, more so along the right side of the disc space due to the scoliotic curvature. There is no suspicious marrow signal abnormality or marrow edema.   Conus medullaris and cauda equina: Conus extends to the L1 level. Conus and cauda equina appear normal.   Paraspinal and other soft tissues: Unremarkable.   Disc levels:   There is advanced disc  desiccation and narrowing at L3-L4 with associated degenerative endplate irregularity. There is mild desiccation and narrowing at L2-L3. The other disc heights are overall preserved.   T12-L1: No significant spinal canal or neural foraminal stenosis   L1-L2: There is a mild disc bulge without significant spinal canal or neural foraminal stenosis   L2-L3: There is a diffuse disc bulge with an inferiorly migrated right subarticular zone extrusion extending approximately 1.3 cm inferiorly from the level of the superior L3 endplate, and mild bilateral facet arthropathy resulting in moderate spinal canal stenosis with crowding of the cauda equina nerve roots and impingement of the traversing right L3 nerve root, and mild right and no significant left neural foraminal stenosis   L3-L4: There is a mild disc bulge and bilateral facet arthropathy with small effusions resulting in mild spinal canal stenosis and mild bilateral neural foraminal stenosis   L4-L5: There is a broad-based left subarticular zone/foraminal disc protrusion and bilateral facet arthropathy with a small right effusion resulting in mild narrowing of the left subarticular zone without evidence of nerve root impingement, and moderate left and mild right neural foraminal stenosis   L5-S1: Mild bilateral facet arthropathy without significant spinal canal or neural foraminal stenosis.   IMPRESSION: 1. Significant levoscoliosis centered at L3, grossly similar to the radiographs from 2017. There is associated disc space narrowing and degenerative endplate change at L3-L4, more so along the right aspect of the disc space due to the scoliotic curvature. There is a mild disc bulge and bilateral facet arthropathy at this level resulting in mild spinal canal stenosis and mild bilateral neural foraminal stenosis. 2. Diffuse disc bulge with a superiorly migrated right subarticular zone extrusion at L2-L3 resulting in moderate  spinal canal stenosis with crowding of the cauda equina nerve roots and impingement of the traversing right L3 nerve root. 3. Broad-based left subarticular zone/foraminal protrusion at L3-L4 resulting in moderate left neural foraminal stenosis.     Electronically Signed   By: Lesia Hausen M.D.   On: 08/10/2021 11:10  Imaging: No results found.   PMFS History: Patient Active Problem List   Diagnosis Date Noted   Mixed hyperlipidemia 11/14/2021   Wheezing 11/07/2021   COVID-19 virus infection 11/07/2021   Hypoxemia 11/07/2021   Acute hypoxemic respiratory failure (HCC) 11/07/2021   Hypokalemia 11/07/2021   Low back pain 10/04/2021   Environmental and seasonal allergies 08/26/2021   Seasonal allergic rhinitis 08/26/2021   Body mass index (BMI) of 32.0 to 32.9 in adult 08/02/2021   High risk medication use 07/05/2021   Moderate persistent asthma without complication 09/17/2020   S/P total knee arthroplasty, left 08/17/2020   Inflammatory arthritis 10/10/2019   Perimenopausal disorder 11/28/2018   GAD (generalized anxiety disorder) 06/28/2018   Primary osteoarthritis of left knee 04/17/2018   Prediabetes 12/03/2017   Irritable bowel 03/19/2017   GERD without esophagitis 03/19/2017   Fibromyalgia 11/28/2016   Migraine without aura and without status migrainosus, not  intractable 10/17/2016   Essential hypertension 10/17/2016   Anxiety and depression 03/28/2016   Past Medical History:  Diagnosis Date   Allergy    Anemia    on iron- past hx   Anxiety    Arthritis    left knee   Asthma    COPD (chronic obstructive pulmonary disease) (HCC)    Depression    GERD (gastroesophageal reflux disease)    High cholesterol    Per patient   Hypertension    Neuromuscular disorder (HCC)    right arm   PTSD (post-traumatic stress disorder)    Per patient   Refusal of blood product     Family History  Problem Relation Age of Onset   Rheum arthritis Mother    Hypertension  Mother    Depression Mother    Anxiety disorder Mother    Bipolar disorder Father    Mental illness Sister    Depression Sister    Congestive Heart Failure Maternal Grandfather    Colon cancer Neg Hx    Colon polyps Neg Hx    Esophageal cancer Neg Hx    Rectal cancer Neg Hx    Stomach cancer Neg Hx     Past Surgical History:  Procedure Laterality Date   DILATION AND CURETTAGE OF UTERUS     2002   TOTAL KNEE ARTHROPLASTY Left 08/17/2020   Procedure: LEFT TOTAL KNEE ARTHROPLASTY;  Surgeon: Cammy Copa, MD;  Location: [facility] OR;  Service: Orthopedics;  Laterality: Left;   UPPER GASTROINTESTINAL ENDOSCOPY     UPPER GI ENDOSCOPY     WISDOM TOOTH EXTRACTION     Social History   Occupational History   Not on file  Tobacco Use   Smoking status: Never   Smokeless tobacco: Never  Vaping Use   Vaping Use: Never used  Substance and Sexual Activity   Alcohol use: Yes    Comment: Occasionally   Drug use: Never   Sexual activity: Not on file

## 2021-11-18 ENCOUNTER — Encounter: Payer: Self-pay | Admitting: Allergy

## 2021-11-18 ENCOUNTER — Ambulatory Visit (INDEPENDENT_AMBULATORY_CARE_PROVIDER_SITE_OTHER): Payer: 59 | Admitting: Allergy

## 2021-11-18 VITALS — BP 124/74 | HR 86 | Temp 97.6°F | Resp 16 | Ht 66.0 in | Wt 204.0 lb

## 2021-11-18 DIAGNOSIS — J3089 Other allergic rhinitis: Secondary | ICD-10-CM | POA: Diagnosis not present

## 2021-11-18 DIAGNOSIS — L299 Pruritus, unspecified: Secondary | ICD-10-CM

## 2021-11-18 DIAGNOSIS — L509 Urticaria, unspecified: Secondary | ICD-10-CM

## 2021-11-18 DIAGNOSIS — J4540 Moderate persistent asthma, uncomplicated: Secondary | ICD-10-CM | POA: Diagnosis not present

## 2021-11-18 NOTE — Patient Instructions (Addendum)
Itching Welts Allergies Reactive airway/COPD   - Testing today showed: grasses, ragweed, weeds, trees, outdoor molds, dust mites, cat, and dog. - Copy of test results provided.  - Avoidance measures provided. - Recommend the following:  Recommend performing allergen avoidance measures and handouts provided (especially performing dust mite avoidance).    Xyzal (levocetirizine) 5mg  tablet once daily If having itchy/watery eyes can use Pataday 1 drop each eye daily as needed If having nasal congestion/drainage can use nasal steroid spray like Flonase, Nasacort or Rhinocort 2 sprays each nostril daily as needed Continue as needed use of hydrocortisone ointment for itching Keep skin moisturized to avoid getting dry which will worsen itch - You can use an extra dose of the antihistamine, if needed, for breakthrough symptoms.  - Consider allergy shots as a means of long-term control. - Allergy shots "re-train" and "reset" the immune system to ignore environmental allergens and decrease the resulting immune response to those allergens (sneezing, itchy watery eyes, runny nose, nasal congestion, etc).    - Allergy shots improve symptoms in 80-85% of patients.  - We can discuss more at a future appointment if the medications are not working for you.  - Have access to albuterol inhaler 2 puffs every 4-6 hours as needed for cough/wheeze/shortness of breath/chest tightness.  May use 15-20 minutes prior to activity.   Monitor frequency of use.   - Use Symbicort 2 puffs twice a day every day for stabilization of airway  - Continue to avoid fragrances that cause sneezing  - Consider patch testing for contact dermatitis. Patch testing is the test of choice to evaluate for contact dermatitis.  Recommend performing patch testing with the TRUE test patch panels.  Patches are best placed on a Monday with return to office on Wednesday and Friday of same week for readings.  Once patches are in place to do not get  them wet.  You can take antihistamines while patches are in place.   True Test looks for the following sensitivities:     Follow-up in 2-3 months or sooner if needed

## 2021-11-18 NOTE — Progress Notes (Signed)
New Patient Note  RE: Kristi Decker MRN: [id number] DOB: Sep 20, 1975 Date of Office Visit: 11/18/2021  Primary care provider: Jake Shark, PA-C  Chief Complaint: itchy skin  History of present illness: Kristi Decker is a 46 y.o. female presenting today for evaluation of pruritic rash.   She states one night while watching tv she started itching and scratching all over her body.  This started about 6 months ago.  She will note this itching couple times a week and occurs mostly at night in bed.  She states sometimes can be sitting on her couch and note the itch.  She states her husband will note how she has scratched herself so.  She states she had 3 big welts on her foot once and is not sure if she was bitten by something.  She states her cat did have fleas however this was remedied and is hoping has not come back. She states if she gets bit by an insect she gets a large welt. She denies swelling.  She had a featherbed topper on bed which she got rid of.  She states she changes her sheets regularly and has not changed any of her body products/detergents/soaps etc.  She has used cortisone cream or calamine lotion that is helpful.  She has used OTC antihistamine that is helpful.    She had Covid within past 3 weeks with body pain, congestion, cough, fatigue, difficulty breathing, headache.  She was hospitalized for a night. She has an albuterol inhaler.  She has Symbicort inhaler as well and states she does not take it twice a day which she has been recommended to.   She states she has chronic bronchitis and states was diagnosed with COPD.  She states she had full PFTs done.  Non-smoker but states her uncle was a smoker and her first husband smoked for a while outside before he quit.   She states sometimes when she sprays her perfumes she will start sneezing.   She states her mother and daughter both can not use any scented products.   She does report seasonal allergy symptoms with  congestion and drainage, sneezing.  She states she has had environmental allergies since childhood.  She states her last testing was in childhood and recalls having positive testing to a variety of different allergens.  Review of systems: Review of Systems  Constitutional: Negative.   HENT: Negative.    Eyes: Negative.   Respiratory: Negative.    Cardiovascular: Negative.   Gastrointestinal: Negative.   Musculoskeletal: Negative.   Skin:        See HPI  Allergic/Immunologic: Negative.   Neurological: Negative.     All other systems negative unless noted above in HPI  Past medical history: Past Medical History:  Diagnosis Date   Allergy    Anemia    on iron- past hx   Anxiety    Arthritis    left knee   Asthma    COPD (chronic obstructive pulmonary disease) (HCC)    Depression    GERD (gastroesophageal reflux disease)    High cholesterol    Per patient   Hypertension    Neuromuscular disorder (HCC)    right arm   PTSD (post-traumatic stress disorder)    Per patient   Refusal of blood product     Past surgical history: Past Surgical History:  Procedure Laterality Date   DILATION AND CURETTAGE OF UTERUS     2002   TOTAL KNEE ARTHROPLASTY Left 08/17/2020  Procedure: LEFT TOTAL KNEE ARTHROPLASTY;  Surgeon: Cammy Copa, MD;  Location: [facility] OR;  Service: Orthopedics;  Laterality: Left;   UPPER GASTROINTESTINAL ENDOSCOPY     UPPER GI ENDOSCOPY     WISDOM TOOTH EXTRACTION      Family history:  Family History  Problem Relation Age of Onset   Rheum arthritis Mother    Hypertension Mother    Depression Mother    Anxiety disorder Mother    Bipolar disorder Father    Mental illness Sister    Depression Sister    Congestive Heart Failure Maternal Grandfather    Colon cancer Neg Hx    Colon polyps Neg Hx    Esophageal cancer Neg Hx    Rectal cancer Neg Hx    Stomach cancer Neg Hx     Social history: Lives in a home with carpeting in the bedroom with gas  heating and window cooling.  Cat in the home.  There is no concern for water damage, mildew or roaches in the home.  She is a Occupational psychologist.  She denies a smoking history.   Medication List: Current Outpatient Medications  Medication Sig Dispense Refill   APPLE CIDER VINEGAR PO Take 1 capsule by mouth 2 (two) times daily.     atorvastatin (LIPITOR) 20 MG tablet Take 1 tablet (20 mg total) by mouth daily. 90 tablet 1   Calcium-Magnesium-Zinc (CAL-MAG-ZINC PO) Take 1 tablet by mouth daily.     cholecalciferol (VITAMIN D3) 25 MCG (1000 UNIT) tablet Take 1,000 Units by mouth daily.     clonazePAM (KLONOPIN) 0.5 MG tablet Take 0.5 mg by mouth 2 (two) times daily as needed for anxiety.     Cyanocobalamin (VITAMIN B-12 PO) Take 1 tablet by mouth daily.     DULoxetine (CYMBALTA) 60 MG capsule Take 120 mg by mouth daily.     estradiol (ESTRACE) 0.5 MG tablet Take 1 tablet (0.5 mg total) by mouth daily. 30 tablet 11   ferrous sulfate 325 (65 FE) MG tablet Take 325 mg by mouth daily.     lamoTRIgine (LAMICTAL) 25 MG tablet Take 75 mg by mouth daily.     lisinopril-hydrochlorothiazide (ZESTORETIC) 20-12.5 MG tablet Take 1 tablet by mouth daily. 90 tablet 1   nirmatrelvir & ritonavir (PAXLOVID, 150/100,) 10 x 150 MG & 10 x 100MG  TBPK Take by mouth.     pantoprazole (PROTONIX) 40 MG tablet Take 1 tablet (40 mg total) by mouth daily. 90 tablet 1   progesterone (PROMETRIUM) 100 MG capsule Take 1 capsule (100 mg total) by mouth at bedtime. 30 capsule 12   sulfaSALAzine (AZULFIDINE) 500 MG tablet Take 2 tablets (1,000 mg total) by mouth 2 (two) times daily. 120 tablet 2   Suvorexant (BELSOMRA) 15 MG TABS Take 15 mg by mouth at bedtime.     albuterol (VENTOLIN HFA) 108 (90 Base) MCG/ACT inhaler Inhale 2 puffs into the lungs every 6 (six) hours as needed for wheezing or shortness of breath. (Patient not taking: Reported on 11/18/2021) 18 g 0   sulfaSALAzine (AZULFIDINE) 500 MG tablet Take 1 tablet  (500 mg total) by mouth 2 (two) times daily for 4 days. Start taking Sulfasalazine 1 gm po BID, 3 days after completing Paxovid 8 tablet 0   SYMBICORT 160-4.5 MCG/ACT inhaler INHALE 2 PUFFS INTO THE LUNGS TWO TIMES A DAY AS NEEDED FOR SHORTNESS OF BREATH/WHEEZING (Patient not taking: Reported on 11/18/2021) 10.2 g 5   No current facility-administered medications for this  visit.    Known medication allergies: No Known Allergies   Physical examination: Blood pressure 124/74, pulse 86, temperature 97.6 F (36.4 C), resp. rate 16, height 5\' 6"  (1.676 m), weight 204 lb (92.5 kg), last menstrual period 05/16/2019, SpO2 94 %.  General: Alert, interactive, in no acute distress. HEENT: PERRLA, TMs pearly gray, turbinates non-edematous without discharge, post-pharynx non erythematous. Neck: Supple without lymphadenopathy. Lungs: Clear to auscultation without wheezing, rhonchi or rales. {no increased work of breathing. CV: Normal S1, S2 without murmurs. Abdomen: Nondistended, nontender. Skin: Warm and dry, without lesions or rashes. Extremities:  No clubbing, cyanosis or edema. Neuro:   Grossly intact.  Diagnositics/Labs:  Spirometry: FEV1: 2.4L 79%, FVC: 2.65L 70% predicted.  FEV1 is a normal response over FVC is low for age and demographic  Allergy testing:   Airborne Adult Perc - 11/18/21 1000     Time Antigen Placed 1029    Allergen Manufacturer 01/19/22    Location Back    Number of Test 59    1. Control-Buffer 50% Glycerol Negative    2. Control-Histamine 1 mg/ml 2+    3. Albumin saline Negative    4. Bahia Negative    5. Waynette Buttery Negative    6. Johnson Negative    7. Kentucky Blue Negative    8. Meadow Fescue Negative    9. Perennial Rye 2+    10. Sweet Vernal 2+    11. Timothy Negative    12. Cocklebur 2+    13. Burweed Marshelder 2+    14. Ragweed, short Negative    15. Ragweed, Giant 2+    16. Plantain,  English Negative    17. Lamb's Quarters Negative    18. Sheep  Sorrell Negative    19. Rough Pigweed 2+    20. Marsh Elder, Rough Negative    21. Mugwort, Common 2+    22. Ash mix 2+    23. Birch mix 2+    24. Beech American 2+    25. Box, Elder Negative    26. Cedar, red Negative    27. Cottonwood, [place] Negative    28. Elm mix Negative    29. Hickory Negative    30. Maple mix Negative    31. Oak, [place] mix Negative    32. Pecan Pollen Negative    33. Pine mix Negative    34. Sycamore Eastern Negative    35. Walnut, Black Pollen Negative    36. Alternaria alternata 2+    37. Cladosporium Herbarum Negative    38. Aspergillus mix Negative    39. Penicillium mix Negative    40. Bipolaris sorokiniana (Helminthosporium) Negative    41. Drechslera spicifera (Curvularia) Negative    42. Mucor plumbeus 2+    43. Fusarium moniliforme Negative    44. Aureobasidium pullulans (pullulara) Negative    45. Rhizopus oryzae Negative    46. Botrytis cinera Negative    47. Epicoccum nigrum 2+    48. Phoma betae Negative    49. Candida Albicans Negative    50. Trichophyton mentagrophytes Negative    51. Mite, D Farinae  5,000 AU/ml 2+    52. Mite, D Pteronyssinus  5,000 AU/ml Negative    53. Cat Hair 10,000 BAU/ml Negative    54.  Dog Epithelia Negative    55. Mixed Feathers Negative    56. Horse Epithelia Negative    57. Cockroach, German Negative    58. Mouse Negative    59. Tobacco Leaf Negative  Intradermal - 11/18/21 1100     Time Antigen Placed 1136    Allergen Manufacturer Waynette Buttery    Location Back    Number of Test 5    Control Negative    Mold 2 Negative    Mold 4 Negative    Cat 3+    Dog 2+    Cockroach Negative             Allergy testing results were read and interpreted by provider, documented by clinical staff.   Assessment and plan:   Pruritus Urticaria Allergic rhinitis Reactive airway/COPD  - Testing today showed: grasses, ragweed, weeds, trees, outdoor molds, dust mites, cat, and dog. -  Copy of test results provided.  - Avoidance measures provided. - Recommend the following:  Recommend performing allergen avoidance measures and handouts provided (especially performing dust mite avoidance).    Xyzal (levocetirizine) 5mg  tablet once daily If having itchy/watery eyes can use Pataday 1 drop each eye daily as needed If having nasal congestion/drainage can use nasal steroid spray like Flonase, Nasacort or Rhinocort 2 sprays each nostril daily as needed Continue as needed use of hydrocortisone ointment for itching Keep skin moisturized to avoid getting dry which will worsen itch - You can use an extra dose of the antihistamine, if needed, for breakthrough symptoms.  - Consider allergy shots as a means of long-term control. - Allergy shots "re-train" and "reset" the immune system to ignore environmental allergens and decrease the resulting immune response to those allergens (sneezing, itchy watery eyes, runny nose, nasal congestion, etc).    - Allergy shots improve symptoms in 80-85% of patients.  - We can discuss more at a future appointment if the medications are not working for you.  - Have access to albuterol inhaler 2 puffs every 4-6 hours as needed for cough/wheeze/shortness of breath/chest tightness.  May use 15-20 minutes prior to activity.   Monitor frequency of use.   - Use Symbicort 2 puffs twice a day every day for stabilization of airway  - Continue to avoid fragrances that cause sneezing  - Consider patch testing for contact dermatitis. Patch testing is the test of choice to evaluate for contact dermatitis.  Recommend performing patch testing with the TRUE test patch panels.  Patches are best placed on a Monday with return to office on Wednesday and Friday of same week for readings.  Once patches are in place to do not get them wet.  You can take antihistamines while patches are in place.   Follow-up in 2-3 months or sooner if needed  I appreciate the opportunity to  take part in Anjana's care. Please do not hesitate to contact me with questions.  Sincerely,   Saturday, MD Allergy/Immunology [facility]

## 2021-11-22 ENCOUNTER — Other Ambulatory Visit: Payer: Self-pay | Admitting: Osteopathic Medicine

## 2021-11-22 DIAGNOSIS — Z1231 Encounter for screening mammogram for malignant neoplasm of breast: Secondary | ICD-10-CM

## 2021-11-23 ENCOUNTER — Other Ambulatory Visit: Payer: Self-pay | Admitting: Internal Medicine

## 2021-11-23 NOTE — Telephone Encounter (Signed)
Next Visit: Due august 2023. Message sent to the front to schedule   Last Visit: 10/04/2021   Last Fill: 08/20/2021  DX: seronegative rheumatoid arthritis   Current Dose per office note 10/04/2021: diclofenac 75 mg BID   Labs: 10/04/2021 Lab results look fine for continuing sulfasalazine and diclofenac.  Okay to refill Diclofenac?

## 2021-11-23 NOTE — Telephone Encounter (Signed)
Please schedule patient a follow up visit. Patient due August 2023. Thanks!  

## 2021-11-24 ENCOUNTER — Ambulatory Visit: Payer: 59

## 2021-11-28 ENCOUNTER — Ambulatory Visit: Payer: 59 | Admitting: Physical Medicine and Rehabilitation

## 2021-11-28 ENCOUNTER — Encounter: Payer: Self-pay | Admitting: Physical Medicine and Rehabilitation

## 2021-11-28 VITALS — BP 131/92 | HR 67

## 2021-11-28 DIAGNOSIS — M48061 Spinal stenosis, lumbar region without neurogenic claudication: Secondary | ICD-10-CM | POA: Diagnosis not present

## 2021-11-28 DIAGNOSIS — M4726 Other spondylosis with radiculopathy, lumbar region: Secondary | ICD-10-CM

## 2021-11-28 DIAGNOSIS — M797 Fibromyalgia: Secondary | ICD-10-CM

## 2021-11-28 DIAGNOSIS — G8929 Other chronic pain: Secondary | ICD-10-CM

## 2021-11-28 DIAGNOSIS — M5442 Lumbago with sciatica, left side: Secondary | ICD-10-CM

## 2021-11-28 DIAGNOSIS — M5416 Radiculopathy, lumbar region: Secondary | ICD-10-CM

## 2021-11-28 NOTE — Progress Notes (Unsigned)
Kristi Decker - 46 y.o. female MRN [id number]  Date of birth: 1976/01/05  Office Visit Note: Visit Date: 11/28/2021 PCP: Jac Canavan, PA-C Referred by: Cammy Copa, MD  Subjective: Chief Complaint  Patient presents with   Lower Back - Pain   Left Hip - Pain   HPI: Kristi Decker is a 46 y.o. female who comes in today for evaluation of chronic, worsening and severe bilateral lower back pain radiating to left buttock and lateral thigh. Patient reports pain has been ongoing for several months and is exacerbated by movement, activity and standing. She describes pain as a constant sore and aching sensation, currently rates as 7 out of 10. She reports some relief of pain with home exercise regimen, rest and use of over the counter medications. Patients recent lumbar MRI imaging exhibits significant levoscoliosis centered at L3, at the level of L2-L3 there is right subarticular zone extrusion extending approximately 1.3 cm inferiorly from the level of the superior L3 endplate, moderate spinal canal stenosis also noted at this level. At the level of L4-L5 there is a broad-based left subarticular zone/foraminal disc protrusion and bilateral facet arthropathy with a small right effusion causing narrowing of the left lateral recess. No high grade spinal canal stenosis noted. Patient had left L3-L4 interlaminar epidural steroid injection performed in our office on 09/13/2021 and reports no relief of pain with this procedure. States her pain has increased over time and now negatively impacting her ability to work. Patient is employed full time as Occupational psychologist at [employer], states her pain significantly increases with prolonged standing at work. Patient states her job does allow her to sit when needed. Patient was recently seen by Dr. August Saucer and was referred back to [place] for possible injection. Patient was recently referred to physical therapy as well, however states she is unable to  afford this treatment at this time. Patient denies focal weakness, numbness and tingling. Patient denies recent trauma or falls.    Review of Systems  Musculoskeletal:  Positive for back pain.  Neurological:  Negative for tingling, sensory change, focal weakness and weakness.  All other systems reviewed and are negative.  Otherwise per HPI.  Assessment & Plan: Visit Diagnoses:    ICD-10-CM   1. Lumbar radiculopathy  M54.16 Ambulatory referral to Physical Medicine Rehab    2. Chronic bilateral low back pain with left-sided sciatica  M54.42 Ambulatory referral to Physical Medicine Rehab   G89.29     3. Other spondylosis with radiculopathy, lumbar region  M47.26 Ambulatory referral to Physical Medicine Rehab    4. Spinal stenosis of lumbar region without neurogenic claudication  M48.061 Ambulatory referral to Physical Medicine Rehab    5. Fibromyalgia  M79.7 Ambulatory referral to Physical Medicine Rehab       Plan: Findings:  Chronic, worsening and severe bilateral lower back pain radiating down left buttock to left lateral thigh region. Patient continues to have severe pain despite good conservative therapies such as home exercise regimen, rest and use of medications. No relief with recent left L3-L4 interlaminar epidural steroid injection performed in our office. Patients clinical presentation and exam are consistent with L4 nerve pattern. We believe the next step is to perform a diagnostic and hopefully therapeutic left L4 transforaminal epidural steroid injection under fluoroscopic guidance. If patient gets good relief with epidural steroid injection I did explain to her that this procedure can be repeated infrequently as warranted. If she does not get relief with injection, we briefly discussed  possible consult with neurosurgeon to discuss options. Patient encouraged to remain active, no red flag symptoms noted upon exam today.     Meds & Orders: No orders of the defined types were  placed in this encounter.   Orders Placed This Encounter  Procedures   Ambulatory referral to Physical Medicine Rehab    Follow-up: Return for Left L4 transforaminal epidural steroid injection.   Procedures: No procedures performed      Clinical History: MRI LUMBAR SPINE WITHOUT CONTRAST   TECHNIQUE: Multiplanar, multisequence MR imaging of the lumbar spine was performed. No intravenous contrast was administered.   COMPARISON:  X-ray lumbar 06/23/2021 10/11/2015.   FINDINGS: Segmentation: Standard; the lowest formed disc space is designated L5-S1.   Alignment: There is significant levoscoliosis centered at L3, grossly similar to the radiographs from 2017. There is no antero or retrolisthesis.   Vertebrae: Background marrow signal is normal. There is degenerative endplate marrow signal abnormality at L3-L4, more so along the right side of the disc space due to the scoliotic curvature. There is no suspicious marrow signal abnormality or marrow edema.   Conus medullaris and cauda equina: Conus extends to the L1 level. Conus and cauda equina appear normal.   Paraspinal and other soft tissues: Unremarkable.   Disc levels:   There is advanced disc desiccation and narrowing at L3-L4 with associated degenerative endplate irregularity. There is mild desiccation and narrowing at L2-L3. The other disc heights are overall preserved.   T12-L1: No significant spinal canal or neural foraminal stenosis   L1-L2: There is a mild disc bulge without significant spinal canal or neural foraminal stenosis   L2-L3: There is a diffuse disc bulge with an inferiorly migrated right subarticular zone extrusion extending approximately 1.3 cm inferiorly from the level of the superior L3 endplate, and mild bilateral facet arthropathy resulting in moderate spinal canal stenosis with crowding of the cauda equina nerve roots and impingement of the traversing right L3 nerve root, and mild  right and no significant left neural foraminal stenosis   L3-L4: There is a mild disc bulge and bilateral facet arthropathy with small effusions resulting in mild spinal canal stenosis and mild bilateral neural foraminal stenosis   L4-L5: There is a broad-based left subarticular zone/foraminal disc protrusion and bilateral facet arthropathy with a small right effusion resulting in mild narrowing of the left subarticular zone without evidence of nerve root impingement, and moderate left and mild right neural foraminal stenosis   L5-S1: Mild bilateral facet arthropathy without significant spinal canal or neural foraminal stenosis.   IMPRESSION: 1. Significant levoscoliosis centered at L3, grossly similar to the radiographs from 2017. There is associated disc space narrowing and degenerative endplate change at L3-L4, more so along the right aspect of the disc space due to the scoliotic curvature. There is a mild disc bulge and bilateral facet arthropathy at this level resulting in mild spinal canal stenosis and mild bilateral neural foraminal stenosis. 2. Diffuse disc bulge with a superiorly migrated right subarticular zone extrusion at L2-L3 resulting in moderate spinal canal stenosis with crowding of the cauda equina nerve roots and impingement of the traversing right L3 nerve root. 3. Broad-based left subarticular zone/foraminal protrusion at L3-L4 resulting in moderate left neural foraminal stenosis.     Electronically Signed   By: Lesia Hausen M.D.   On: 08/10/2021 11:10   She reports that she has never smoked. She has never used smokeless tobacco.  Recent Labs    08/26/21 1135  HGBA1C 5.2  Objective:  VS:  HT:    WT:   BMI:     BP: (!) 131/92  HR:67bpm  TEMP: ( )  RESP:  Physical Exam Vitals and nursing note reviewed.  HENT:     Head: Normocephalic and atraumatic.     Right Ear: External ear normal.     Left Ear: External ear normal.     Nose: Nose  normal.     Mouth/Throat:     Mouth: Mucous membranes are moist.  Eyes:     Extraocular Movements: Extraocular movements intact.  Cardiovascular:     Rate and Rhythm: Normal rate.     Pulses: Normal pulses.  Pulmonary:     Effort: Pulmonary effort is normal.  Abdominal:     General: Abdomen is flat. There is no distension.  Musculoskeletal:        General: Tenderness present.     Cervical back: Normal range of motion.     Comments: Pt rises from seated position to standing without difficulty. Good lumbar range of motion. Strong distal strength without clonus, no pain upon palpation of greater trochanters. Dysesthesias noted to left L4 dermatome. Sensation intact bilaterally. Walks independently, gait steady.   Skin:    General: Skin is warm and dry.     Capillary Refill: Capillary refill takes less than 2 seconds.  Neurological:     General: No focal deficit present.     Mental Status: She is alert and oriented to person, place, and time.  Psychiatric:        Mood and Affect: Mood normal.        Behavior: Behavior normal.     Ortho Exam  Imaging: No results found.  Past Medical/Family/Surgical/Social History: Medications & Allergies reviewed per EMR, new medications updated. Patient Active Problem List   Diagnosis Date Noted   Mixed hyperlipidemia 11/14/2021   Wheezing 11/07/2021   COVID-19 virus infection 11/07/2021   Hypoxemia 11/07/2021   Acute hypoxemic respiratory failure (HCC) 11/07/2021   Hypokalemia 11/07/2021   Low back pain 10/04/2021   Environmental and seasonal allergies 08/26/2021   Seasonal allergic rhinitis 08/26/2021   Body mass index (BMI) of 32.0 to 32.9 in adult 08/02/2021   High risk medication use 07/05/2021   Moderate persistent asthma without complication 09/17/2020   S/P total knee arthroplasty, left 08/17/2020   Inflammatory arthritis 10/10/2019   Perimenopausal disorder 11/28/2018   GAD (generalized anxiety disorder) 06/28/2018   Primary  osteoarthritis of left knee 04/17/2018   Prediabetes 12/03/2017   Irritable bowel 03/19/2017   GERD without esophagitis 03/19/2017   Fibromyalgia 11/28/2016   Migraine without aura and without status migrainosus, not intractable 10/17/2016   Essential hypertension 10/17/2016   Anxiety and depression 03/28/2016   Past Medical History:  Diagnosis Date   Allergy    Anemia    on iron- past hx   Anxiety    Arthritis    left knee   Asthma    COPD (chronic obstructive pulmonary disease) (HCC)    Depression    GERD (gastroesophageal reflux disease)    High cholesterol    Per patient   Hypertension    Neuromuscular disorder (HCC)    right arm   PTSD (post-traumatic stress disorder)    Per patient   Refusal of blood product    Family History  Problem Relation Age of Onset   Rheum arthritis Mother    Hypertension Mother    Depression Mother    Anxiety disorder Mother  Bipolar disorder Father    Mental illness Sister    Depression Sister    Congestive Heart Failure Maternal Grandfather    Colon cancer Neg Hx    Colon polyps Neg Hx    Esophageal cancer Neg Hx    Rectal cancer Neg Hx    Stomach cancer Neg Hx    Past Surgical History:  Procedure Laterality Date   DILATION AND CURETTAGE OF UTERUS     2002   TOTAL KNEE ARTHROPLASTY Left 08/17/2020   Procedure: LEFT TOTAL KNEE ARTHROPLASTY;  Surgeon: Cammy Copa, MD;  Location: [facility] OR;  Service: Orthopedics;  Laterality: Left;   UPPER GASTROINTESTINAL ENDOSCOPY     UPPER GI ENDOSCOPY     WISDOM TOOTH EXTRACTION     Social History   Occupational History   Not on file  Tobacco Use   Smoking status: Never   Smokeless tobacco: Never  Vaping Use   Vaping Use: Never used  Substance and Sexual Activity   Alcohol use: Yes    Comment: Occasionally   Drug use: Never   Sexual activity: Not on file

## 2021-11-28 NOTE — Progress Notes (Unsigned)
Pt state lower back pain that travels to buttocks and left hip. Pt state standing and walking makes the pain worse. Pt state she takes pain meds, take yoga and uses heat to help ease her pain.  Numeric Pain Rating Scale and Functional Assessment Average Pain 10 Pain Right Now 7 My pain is constant, burning, dull, tingling, and aching Pain is worse with: walking, standing, and some activites Pain improves with: rest, heat/ice, medication, and injections   In the last MONTH (on 0-10 scale) has pain interfered with the following?  1. General activity like being  able to carry out your everyday physical activities such as walking, climbing stairs, carrying groceries, or moving a chair?  Rating(5)  2. Relation with others like being able to carry out your usual social activities and roles such as  activities at home, at work and in your community. Rating(6)  3. Enjoyment of life such that you have  been bothered by emotional problems such as feeling anxious, depressed or irritable?  Rating(7)

## 2021-11-30 ENCOUNTER — Ambulatory Visit (INDEPENDENT_AMBULATORY_CARE_PROVIDER_SITE_OTHER): Payer: 59

## 2021-11-30 DIAGNOSIS — Z1231 Encounter for screening mammogram for malignant neoplasm of breast: Secondary | ICD-10-CM | POA: Diagnosis not present

## 2021-12-16 ENCOUNTER — Ambulatory Visit: Payer: 59 | Admitting: Internal Medicine

## 2021-12-16 ENCOUNTER — Encounter: Payer: Self-pay | Admitting: Internal Medicine

## 2021-12-16 VITALS — BP 120/70 | HR 95 | Ht 66.0 in | Wt 205.6 lb

## 2021-12-16 DIAGNOSIS — J4540 Moderate persistent asthma, uncomplicated: Secondary | ICD-10-CM

## 2021-12-16 DIAGNOSIS — J441 Chronic obstructive pulmonary disease with (acute) exacerbation: Secondary | ICD-10-CM

## 2021-12-16 LAB — POCT EXHALED NITRIC OXIDE: FeNO level (ppb): 11

## 2021-12-16 MED ORDER — ALBUTEROL SULFATE (2.5 MG/3ML) 0.083% IN NEBU: 2.5000 mg | INHALATION_SOLUTION | Freq: Four times a day (QID) | RESPIRATORY_TRACT | 5 refills | Status: AC | PRN

## 2021-12-16 MED ORDER — ALBUTEROL SULFATE HFA 108 (90 BASE) MCG/ACT IN AERS: 2.0000 | INHALATION_SPRAY | Freq: Four times a day (QID) | RESPIRATORY_TRACT | 5 refills | Status: DC | PRN

## 2021-12-16 NOTE — Patient Instructions (Addendum)
Please schedule follow up scheduled with myself in 2 months.  If my schedule is not open yet, we will contact you with a reminder closer to that time. Please call [phone] if you haven't heard from [place] a month before.   Continue your symbicort inhaler 2 puffs twice a day, gargle after use.  I have sent albuterol nebulizer and inhaler to your pharmacy.   Take the albuterol rescue inhaler or nebulizer every 4 to 6 hours as needed for wheezing or shortness of breath. You can also take it 15 minutes before exercise or exertional activity. Side effects include heart racing or pounding, jitters or anxiety. If you have a history of an irregular heart rhythm, it can make this worse. Can also give some patients a hard time sleeping.   By learning about asthma and how it can be controlled, you take an important step toward managing this disease. Work closely with your asthma care team to learn all you can about your asthma, how to avoid triggers, what your medications do, and how to take them correctly. With proper care, you can live free of asthma symptoms and maintain a normal, healthy lifestyle.   What is asthma? Asthma is a chronic disease that affects the airways of the lungs. During normal breathing, the bands of muscle that surround the airways are relaxed and air moves freely. During an asthma episode or "attack," there are three main changes that stop air from moving easily through the airways: The bands of muscle that surround the airways tighten and make the airways narrow. This tightening is called bronchospasm.  The lining of the airways becomes swollen or inflamed.  The cells that line the airways produce more mucus, which is thicker than normal and clogs the airways.  These three factors - bronchospasm, inflammation, and mucus production - cause symptoms such as difficulty breathing, wheezing, and coughing.  What are the most common symptoms of asthma? Asthma symptoms are not the same  for everyone. They can even change from episode to episode in the same person. Also, you may have only one symptom of asthma, such as cough, but another person may have all the symptoms of asthma. It is important to know all the symptoms of asthma and to be aware that your asthma can present in any of these ways at any time. The most common symptoms include: Coughing, especially at night  Shortness of breath  Wheezing  Chest tightness, pain, or pressure   Who is affected by asthma? Asthma affects 22 million Americans; about 6 million of these are children under age 60. People who have a family history of asthma have an increased risk of developing the disease. Asthma is also more common in people who have allergies or who are exposed to tobacco smoke. However, anyone can develop asthma at any time. Some people may have asthma all of their lives, while others may develop it as adults.  What causes asthma? The airways in a person with asthma are very sensitive and react to many things, or "triggers." Contact with these triggers causes asthma symptoms. One of the most important parts of asthma control is to identify your triggers and then avoid them when possible. The only trigger you do not want to avoid is exercise. Pre-treatment with medicines before exercise can allow you to stay active yet avoid asthma symptoms. Common asthma triggers include: Infections (colds, viruses, flu, sinus infections)  Exercise  Weather (changes in temperature and/or humidity, cold air)  Tobacco smoke  Allergens (dust mites, pollens, pets, mold spores, cockroaches, and sometimes foods)  Irritants (strong odors from cleaning products, perfume, wood smoke, air pollution)  Strong emotions such as crying or laughing hard  Some medications   How is asthma diagnosed? To diagnose asthma, your doctor will first review your medical history, family history, and symptoms. Your doctor will want to know any past history of  breathing problems you may have had, as well as a family history of asthma, allergies, eczema (a bumpy, itchy skin rash caused by allergies), or other lung disease. It is important that you describe your symptoms in detail (cough, wheeze, shortness of breath, chest tightness), including when and how often they occur. The doctor will perform a physical examination and listen to your heart and lungs. He or she may also order breathing tests, allergy tests, blood tests, and chest and sinus X-rays. The tests will find out if you do have asthma and if there are any other conditions that are contributing factors.  How is asthma treated? Asthma can be controlled, but not cured. It is not normal to have frequent symptoms, trouble sleeping, or trouble completing tasks. Appropriate asthma care will prevent symptoms and visits to the emergency room and hospital. Asthma medicines are one of the mainstays of asthma treatment. The drugs used to treat asthma are explained below.  Anti-inflammatories: These are the most important drugs for most people with asthma. Anti-inflammatory drugs reduce swelling and mucus production in the airways. As a result, airways are less sensitive and less likely to react to triggers. These medications need to be taken daily and may need to be taken for several weeks before they begin to control asthma. Anti-inflammatory medicines lead to fewer symptoms, better airflow, less sensitive airways, less airway damage, and fewer asthma attacks. If taken every day, they CONTROL or prevent asthma symptoms.   Bronchodilators: These drugs relax the muscle bands that tighten around the airways. This action opens the airways, letting more air in and out of the lungs and improving breathing. Bronchodilators also help clear mucus from the lungs. As the airways open, the mucus moves more freely and can be coughed out more easily. In short-acting forms, bronchodilators RELIEVE or stop asthma symptoms by  quickly opening the airways and are very helpful during an asthma episode. In long-acting forms, bronchodilators provide CONTROL of asthma symptoms and prevent asthma episodes.  Asthma drugs can be taken in a variety of ways. Inhaling the medications by using a metered dose inhaler, dry powder inhaler, or nebulizer is one way of taking asthma medicines. Oral medicines (pills or liquids you swallow) may also be prescribed.  Asthma severity Asthma is classified as either "intermittent" (comes and goes) or "persistent" (lasting). Persistent asthma is further described as being mild, moderate, or severe. The severity of asthma is based on how often you have symptoms both during the day and night, as well as by the results of lung function tests and by how well you can perform activities. The "severity" of asthma refers to how "intense" or "strong" your asthma is.  Asthma control Asthma control is the goal of asthma treatment. Regardless of your asthma severity, it may or may not be controlled. Asthma control means: You are able to do everything you want to do at work and home  You have no (or minimal) asthma symptoms  You do not wake up from your sleep or earlier than usual in the morning due to asthma  You rarely need to use your  reliever medicine (inhaler)  Another major part of your treatment is that you are happy with your asthma care and believe your asthma is controlled.  Monitoring symptoms A key part of treatment is keeping track of how well your lungs are working. Monitoring your symptoms, what they are, how and when they happen, and how severe they are, is an important part of being able to control your asthma.  Sometimes asthma is monitored using a peak flow meter. A peak flow (PF) meter measures how fast the air comes out of your lungs. It can help you know when your asthma is getting worse, sometimes even before you have symptoms. By taking daily peak flow readings, you can learn when to  adjust medications to keep asthma under good control. It is also used to create your asthma action plan (see below). Your doctor can use your peak flow readings to adjust your treatment plan in some cases.  Asthma Action Plan Based on your history and asthma severity, you and your doctor will develop a care plan called an "asthma action plan." The asthma action plan describes when and how to use your medicines, actions to take when asthma worsens, and when to seek emergency care. Make sure you understand this plan. If you do not, ask your asthma care provider any questions you may have. Your asthma action plan is one of the keys to controlling asthma. Keep it readily available to remind you of what you need to do every day to control asthma and what you need to do when symptoms occur.  Goals of asthma therapy These are the goals of asthma treatment: Live an active, normal life  Prevent chronic and troublesome symptoms  Attend work or school every day  Perform daily activities without difficulty  Stop urgent visits to the doctor, emergency department, or hospital  Use and adjust medications to control asthma with few or no side effects

## 2021-12-16 NOTE — Progress Notes (Signed)
Kristi Decker    [id number]    06-08-1975  Primary Care Physician:Tysinger, Kermit Balo, PA-C  Referring Physician: Jake Shark, PA-C No address on file Reason for Consultation: shortness of breath Date of Consultation: 12/16/2021  Chief complaint:   Chief Complaint  Patient presents with   Consult    Pt f/u had COVID approx June 2023, pt states she feels like her lungs are worse and is now using inhalers at least 2-3 times/day.     HPI: Kristi Decker is a 46 y.o. woman who presents for new patient evaluation of shortness of breath. She had covid infection in June 2023. She had a CT scan in June 2023 which showed no PE, no pneumonia or evidence of parenchymal damage. She was treated with paxlovid as an outpatient.   She has a history of childhood asthma and was prescribed symbicort but was not adherent. She has been taking 2 puffs twice a day since June. This does help her breathing.   She takes albuterol inhaler as needed. She also has a nebulizer machine at home but doesn't have albuterol solution.   She says she might be allergic to cats.  Saw allergist and had testing done. Is allergic to dust mites and uses dustmite precautions. Further studies reviewed below.   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  Exposures: lives at home with husband and cat Smoking history: never smoker, had passive smoke exposure in spouse  Social History   Occupational History   Not on file  Tobacco Use   Smoking status: Never   Smokeless tobacco: Never  Vaping Use   Vaping Use: Never used  Substance and Sexual Activity   Alcohol use: Yes    Comment: Occasionally   Drug use: Never   Sexual activity: Not on file    Relevant family history:  Family History  Problem Relation Age of Onset   Rheum arthritis Mother    Hypertension Mother    Depression Mother    Anxiety disorder Mother    Bipolar  disorder Father    Mental illness Sister    Depression Sister    Congestive Heart Failure Maternal Grandfather    Colon cancer Neg Hx    Colon polyps Neg Hx    Esophageal cancer Neg Hx    Rectal cancer Neg Hx    Stomach cancer Neg Hx     Past Medical History:  Diagnosis Date   Allergy    Anemia    on iron- past hx   Anxiety    Arthritis    left knee   Asthma    COPD (chronic obstructive pulmonary disease) (HCC)    Depression    GERD (gastroesophageal reflux disease)    High cholesterol    Per patient   Hypertension    Neuromuscular disorder (HCC)    right arm   PTSD (post-traumatic stress disorder)    Per patient   Refusal of blood product     Past Surgical History:  Procedure Laterality Date   DILATION AND CURETTAGE OF UTERUS     2002   TOTAL KNEE ARTHROPLASTY Left 08/17/2020   Procedure: LEFT TOTAL KNEE ARTHROPLASTY;  Surgeon: Cammy Copa, MD;  Location: [facility] OR;  Service: Orthopedics;  Laterality: Left;   UPPER GASTROINTESTINAL ENDOSCOPY     UPPER GI ENDOSCOPY     WISDOM TOOTH EXTRACTION  Physical Exam: Blood pressure 120/70, pulse 95, height 5\' 6"  (1.676 m), weight 205 lb 9.6 oz (93.3 kg), last menstrual period 05/16/2019, SpO2 97 %. Gen:      No acute distress ENT:  no nasal polyps, mucus membranes moist Lungs:    No increased respiratory effort, symmetric chest wall excursion, clear to auscultation bilaterally, no wheezes or crackles CV:         Regular rate and rhythm; no murmurs, rubs, or gallops.  No pedal edema Abd:      + bowel sounds; soft, non-tender; no distension MSK: no acute synovitis of DIP or PIP joints, no mechanics hands.  Skin:      Warm and dry; no rashes Neuro: normal speech, no focal facial asymmetry Psych: alert and oriented x3, normal mood and affect   Data Reviewed/Medical Decision Making:  Independent interpretation of tests: Imaging:  Review of patient's CT Chest from June 2023 images revealed no PE, no pneumonia  or fibrosis. The patient's images have been independently reviewed by me.    PFTs: I have personally reviewed the patient's PFTs and Spirometry done in July 2023 at allergist office shows no airflow limitation   Labs:  Lab Results  Component Value Date   WBC 5.3 11/08/2021   HGB 11.5 (L) 11/08/2021   HCT 34.9 (L) 11/08/2021   MCV 95.9 11/08/2021   PLT 268 11/08/2021   Lab Results  Component Value Date   NA 140 11/08/2021   K 3.6 11/08/2021   CL 108 11/08/2021   CO2 25 11/08/2021   Allergy testing reviewed - has sensitivity to grasses, trees molds, dust mites and cats.   Immunization status:  Immunization History  Administered Date(s) Administered   Influenza,inj,Quad PF,6+ Mos 02/24/2016, 02/08/2017, 02/22/2018, 02/14/2019, 02/04/2020   Influenza-Unspecified 04/21/2021   PFIZER(Purple Top)SARS-COV-2 Vaccination 08/24/2019, 09/14/2019   Pfizer Covid-19 Vaccine Bivalent Booster 78yrs & up 04/26/2020, 11/11/2020   Pneumococcal Polysaccharide-23 02/04/2020   Tdap 11/28/2018     I reviewed prior external note(s) from allergy, ED, PCP  I reviewed the result(s) of the labs and imaging as noted above.   I have ordered FENO  Assessment:  Moderate Persistent Asthma, not well controlled Recent covid infection  Plan/Recommendations:  FeNO obtained today showed 11 ppb, not elevated.  Continue symbicort 2 puffs BID with prn albuterol. Nebs refilled today.  I expect she is flaring from her recent covid infection and has ongoing triggers from cat and environmental allergies.  Hopefully with time and increasing albuterol use this will promote her recovery.  Can consider augmenting her therapy for allergies if not well controlled.   We discussed disease management and progression at length today.   Return to Care: Return in about 2 months (around 02/15/2022).  04/17/2022, MD Pulmonary and Critical Care Medicine [facility] [phone]  CC: [facility], Jake Shark

## 2021-12-20 ENCOUNTER — Ambulatory Visit: Payer: Self-pay

## 2021-12-20 ENCOUNTER — Ambulatory Visit (INDEPENDENT_AMBULATORY_CARE_PROVIDER_SITE_OTHER): Payer: 59 | Admitting: Physical Medicine and Rehabilitation

## 2021-12-20 ENCOUNTER — Encounter: Payer: Self-pay | Admitting: Physical Medicine and Rehabilitation

## 2021-12-20 VITALS — BP 102/73 | HR 101

## 2021-12-20 DIAGNOSIS — M5416 Radiculopathy, lumbar region: Secondary | ICD-10-CM | POA: Diagnosis not present

## 2021-12-20 MED ORDER — METHYLPREDNISOLONE ACETATE 80 MG/ML IJ SUSP
80.0000 mg | Freq: Once | INTRAMUSCULAR | Status: AC
  Administered 2021-12-20: 80 mg

## 2021-12-20 NOTE — Progress Notes (Signed)
Pt state lower back pain that travels to buttocks and left hip. Pt state standing and walking makes the pain worse. Pt state she takes pain meds to help ease her pain.  Numeric Pain Rating Scale and Functional Assessment Average Pain 7   In the last MONTH (on 0-10 scale) has pain interfered with the following?  1. General activity like being  able to carry out your everyday physical activities such as walking, climbing stairs, carrying groceries, or moving a chair?  Rating(10)   +Driver, -BT, -Dye Allergies. '

## 2021-12-20 NOTE — Patient Instructions (Signed)

## 2021-12-28 NOTE — Procedures (Signed)
Lumbosacral Transforaminal Epidural Steroid Injection - Sub-Pedicular Approach with Fluoroscopic Guidance  Patient: Kristi Decker      Date of Birth: 09/18/1975 MRN: [id number] PCP: Jac Canavan, PA-C      Visit Date: 12/20/2021   Universal Protocol:    Date/Time: 12/20/2021  Consent Given By: the patient  Position: PRONE  Additional Comments: Vital signs were monitored before and after the procedure. Patient was prepped and draped in the usual sterile fashion. The correct patient, procedure, and site was verified.   Injection Procedure Details:   Procedure diagnoses: Lumbar radiculopathy [M54.16]    Meds Administered:  Meds ordered this encounter  Medications   methylPREDNISolone acetate (DEPO-MEDROL) injection 80 mg    Laterality: Left  Location/Site: L5  Needle:5.0 in., 22 ga.  Short bevel or Quincke spinal needle  Needle Placement: Transforaminal  Findings:    -Comments: Excellent flow of contrast along the nerve, nerve root and into the epidural space.  Procedure Details: After squaring off the end-plates to get a true AP view, the C-arm was positioned so that an oblique view of the foramen as noted above was visualized. The target area is just inferior to the "nose of the scotty dog" or sub pedicular. The soft tissues overlying this structure were infiltrated with 2-3 ml. of 1% Lidocaine without Epinephrine.  The spinal needle was inserted toward the target using a "trajectory" view along the fluoroscope beam.  Under AP and lateral visualization, the needle was advanced so it did not puncture dura and was located close the 6 O'Clock position of the pedical in AP tracterory. Biplanar projections were used to confirm position. Aspiration was confirmed to be negative for CSF and/or blood. A 1-2 ml. volume of Isovue-250 was injected and flow of contrast was noted at each level. Radiographs were obtained for documentation purposes.   After attaining the desired  flow of contrast documented above, a 0.5 to 1.0 ml test dose of 0.25% Marcaine was injected into each respective transforaminal space.  The patient was observed for 90 seconds post injection.  After no sensory deficits were reported, and normal lower extremity motor function was noted,   the above injectate was administered so that equal amounts of the injectate were placed at each foramen (level) into the transforaminal epidural space.   Additional Comments:  The patient tolerated the procedure well Dressing: 2 x 2 sterile gauze and Band-Aid    Post-procedure details: Patient was observed during the procedure. Post-procedure instructions were reviewed.  Patient left the clinic in stable condition.

## 2021-12-28 NOTE — Progress Notes (Signed)
Kristi Decker - 46 y.o. female MRN [id number]  Date of birth: 1975-07-07  Office Visit Note: Visit Date: 12/20/2021 PCP: Jac Canavan, PA-C Referred by: Jac Canavan, PA-C  Subjective: Chief Complaint  Patient presents with   Lower Back - Pain   Left Hip - Pain   HPI:  Kristi Decker is a 46 y.o. female who comes in today at the request of Ellin Goodie, FNP for planned Left L5-S1 Lumbar Transforaminal epidural steroid injection with fluoroscopic guidance.  The patient has failed conservative care including home exercise, medications, time and activity modification.  This injection will be diagnostic and hopefully therapeutic.  Please see requesting physician notes for further details and justification.   ROS Otherwise per HPI.  Assessment & Plan: Visit Diagnoses:    ICD-10-CM   1. Lumbar radiculopathy  M54.16 XR C-ARM NO REPORT    Epidural Steroid injection    methylPREDNISolone acetate (DEPO-MEDROL) injection 80 mg      Plan: No additional findings.   Meds & Orders:  Meds ordered this encounter  Medications   methylPREDNISolone acetate (DEPO-MEDROL) injection 80 mg    Orders Placed This Encounter  Procedures   XR C-ARM NO REPORT   Epidural Steroid injection    Follow-up: Return for visit to requesting provider as needed.   Procedures: No procedures performed  Lumbosacral Transforaminal Epidural Steroid Injection - Sub-Pedicular Approach with Fluoroscopic Guidance  Patient: Kristi Decker      Date of Birth: May 28, 1975 MRN: [id number] PCP: Jac Canavan, PA-C      Visit Date: 12/20/2021   Universal Protocol:    Date/Time: 12/20/2021  Consent Given By: the patient  Position: PRONE  Additional Comments: Vital signs were monitored before and after the procedure. Patient was prepped and draped in the usual sterile fashion. The correct patient, procedure, and site was verified.   Injection Procedure Details:   Procedure diagnoses: Lumbar  radiculopathy [M54.16]    Meds Administered:  Meds ordered this encounter  Medications   methylPREDNISolone acetate (DEPO-MEDROL) injection 80 mg    Laterality: Left  Location/Site: L5  Needle:5.0 in., 22 ga.  Short bevel or Quincke spinal needle  Needle Placement: Transforaminal  Findings:    -Comments: Excellent flow of contrast along the nerve, nerve root and into the epidural space.  Procedure Details: After squaring off the end-plates to get a true AP view, the C-arm was positioned so that an oblique view of the foramen as noted above was visualized. The target area is just inferior to the "nose of the scotty dog" or sub pedicular. The soft tissues overlying this structure were infiltrated with 2-3 ml. of 1% Lidocaine without Epinephrine.  The spinal needle was inserted toward the target using a "trajectory" view along the fluoroscope beam.  Under AP and lateral visualization, the needle was advanced so it did not puncture dura and was located close the 6 O'Clock position of the pedical in AP tracterory. Biplanar projections were used to confirm position. Aspiration was confirmed to be negative for CSF and/or blood. A 1-2 ml. volume of Isovue-250 was injected and flow of contrast was noted at each level. Radiographs were obtained for documentation purposes.   After attaining the desired flow of contrast documented above, a 0.5 to 1.0 ml test dose of 0.25% Marcaine was injected into each respective transforaminal space.  The patient was observed for 90 seconds post injection.  After no sensory deficits were reported, and normal lower extremity motor function was noted,  the above injectate was administered so that equal amounts of the injectate were placed at each foramen (level) into the transforaminal epidural space.   Additional Comments:  The patient tolerated the procedure well Dressing: 2 x 2 sterile gauze and Band-Aid    Post-procedure details: Patient was observed  during the procedure. Post-procedure instructions were reviewed.  Patient left the clinic in stable condition.    Clinical History: MRI LUMBAR SPINE WITHOUT CONTRAST   TECHNIQUE: Multiplanar, multisequence MR imaging of the lumbar spine was performed. No intravenous contrast was administered.   COMPARISON:  X-ray lumbar 06/23/2021 10/11/2015.   FINDINGS: Segmentation: Standard; the lowest formed disc space is designated L5-S1.   Alignment: There is significant levoscoliosis centered at L3, grossly similar to the radiographs from 2017. There is no antero or retrolisthesis.   Vertebrae: Background marrow signal is normal. There is degenerative endplate marrow signal abnormality at L3-L4, more so along the right side of the disc space due to the scoliotic curvature. There is no suspicious marrow signal abnormality or marrow edema.   Conus medullaris and cauda equina: Conus extends to the L1 level. Conus and cauda equina appear normal.   Paraspinal and other soft tissues: Unremarkable.   Disc levels:   There is advanced disc desiccation and narrowing at L3-L4 with associated degenerative endplate irregularity. There is mild desiccation and narrowing at L2-L3. The other disc heights are overall preserved.   T12-L1: No significant spinal canal or neural foraminal stenosis   L1-L2: There is a mild disc bulge without significant spinal canal or neural foraminal stenosis   L2-L3: There is a diffuse disc bulge with an inferiorly migrated right subarticular zone extrusion extending approximately 1.3 cm inferiorly from the level of the superior L3 endplate, and mild bilateral facet arthropathy resulting in moderate spinal canal stenosis with crowding of the cauda equina nerve roots and impingement of the traversing right L3 nerve root, and mild right and no significant left neural foraminal stenosis   L3-L4: There is a mild disc bulge and bilateral facet arthropathy with  small effusions resulting in mild spinal canal stenosis and mild bilateral neural foraminal stenosis   L4-L5: There is a broad-based left subarticular zone/foraminal disc protrusion and bilateral facet arthropathy with a small right effusion resulting in mild narrowing of the left subarticular zone without evidence of nerve root impingement, and moderate left and mild right neural foraminal stenosis   L5-S1: Mild bilateral facet arthropathy without significant spinal canal or neural foraminal stenosis.   IMPRESSION: 1. Significant levoscoliosis centered at L3, grossly similar to the radiographs from 2017. There is associated disc space narrowing and degenerative endplate change at L3-L4, more so along the right aspect of the disc space due to the scoliotic curvature. There is a mild disc bulge and bilateral facet arthropathy at this level resulting in mild spinal canal stenosis and mild bilateral neural foraminal stenosis. 2. Diffuse disc bulge with a superiorly migrated right subarticular zone extrusion at L2-L3 resulting in moderate spinal canal stenosis with crowding of the cauda equina nerve roots and impingement of the traversing right L3 nerve root. 3. Broad-based left subarticular zone/foraminal protrusion at L3-L4 resulting in moderate left neural foraminal stenosis.     Electronically Signed   By: Lesia Hausen M.D.   On: 08/10/2021 11:10     Objective:  VS:  HT:    WT:   BMI:     BP:102/73  HR:(!) 101bpm  TEMP: ( )  RESP:  Physical Exam Vitals and  nursing note reviewed.  Constitutional:      General: She is not in acute distress.    Appearance: Normal appearance. She is not ill-appearing.  HENT:     Head: Normocephalic and atraumatic.     Right Ear: External ear normal.     Left Ear: External ear normal.  Eyes:     Extraocular Movements: Extraocular movements intact.  Cardiovascular:     Rate and Rhythm: Normal rate.     Pulses: Normal pulses.   Pulmonary:     Effort: Pulmonary effort is normal. No respiratory distress.  Abdominal:     General: There is no distension.     Palpations: Abdomen is soft.  Musculoskeletal:        General: Tenderness present.     Cervical back: Neck supple.     Right lower leg: No edema.     Left lower leg: No edema.     Comments: Patient has good distal strength with no pain over the greater trochanters.  No clonus or focal weakness.  Skin:    Findings: No erythema, lesion or rash.  Neurological:     General: No focal deficit present.     Mental Status: She is alert and oriented to person, place, and time.     Sensory: No sensory deficit.     Motor: No weakness or abnormal muscle tone.     Coordination: Coordination normal.  Psychiatric:        Mood and Affect: Mood normal.        Behavior: Behavior normal.      Imaging: No results found.

## 2022-01-04 NOTE — Progress Notes (Deleted)
Office Visit Note  Patient: Kristi Decker             Date of Birth: 1975/11/01           MRN: [id number]             PCP: Jac Canavan, PA-C Referring: Lexine Baton Visit Date: 01/09/2022   Subjective:  No chief complaint on file.   History of Present Illness: Kristi Decker is a 46 y.o. female here for follow up ***   Previous HPI 10/04/2021 Kristi Decker is a 46 y.o. female here for follow up for seronegative RA on sulfasalazine 1000 mg BID and diclofenac 75 mg BID.  She had to temporarily interrupt medications for colonoscopy since her last visit and felt severe worsening of joint pains in all of her body pain within days of stopping the medications.  These are overall improved again since restarting.  She saw Dr. Cyndie Chime for low back epidural steroid injection no complications from this but has not noticed a significant improvement in low back symptoms.  She was recommended this may require trial of up to 2 or 3 shots to determine efficacy.  She is trying to remain active doing stretches for her back and is working on weight reduction trying to decrease carbohydrates from the diet.  She was referred to allergy and asthma clinic for allergy testing.   Previous HPI 07/05/2021 Kristi Decker is a 46 y.o. 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 She still has a lot of joint pain bothering her especially in her back. She is frustrated by persistent of pain and stiffness limiting physical activities. She does feel there is ongoing benefit with sulfasalazine but not helping her back very much. She saw Dr. August Saucer for this problem about 2 weeks ago on 06/23/21 trying conservative treatment with tylenol 3 and robaxin but has not felt a good relief with this so far.   Previous HPI 11/16/20 Kristi Decker is a 46 y.o. female here for chronic polyarticular inflammatory joint pain.  She was previously diagnosed with  seronegative rheumatoid arthritis since about 2020 with previous treatment on oral diclofenac and sulfasalazine with apparently a significant response to treatment.  She also has chronic degenerative arthritis of multiple areas including the back for which she is undergone ESI and her left knee that underwent total knee arthroplasty with Dr. August Saucer earlier this year with a very great improvement in joint pain symptoms.  She discontinued her medication perioperatively and felt that her all over joint pains worsened very noticeably with treatment interruption.  She does also have history for suspected fibromyalgia contributing to generalized joint pains as well as associated symptoms of chronic fatigue and unrefreshing sleep, exertion intolerance worsening symptoms for a day longer afterwards, episodic all over pain that feels like having the flu, previous migraine headaches that have improved, and frequent feeling of cognitive impairment or difficulty.      No Rheumatology ROS completed.   PMFS History:  Patient Active Problem List   Diagnosis Date Noted   Mixed hyperlipidemia 11/14/2021   Wheezing 11/07/2021   COVID-19 virus infection 11/07/2021   Hypoxemia 11/07/2021   Acute hypoxemic respiratory failure (HCC) 11/07/2021   Hypokalemia 11/07/2021   Low back pain 10/04/2021   Environmental and seasonal allergies 08/26/2021   Seasonal allergic rhinitis 08/26/2021   Body mass index (BMI) of 32.0 to 32.9 in adult 08/02/2021   High risk  medication use 07/05/2021   Moderate persistent asthma without complication 09/17/2020   S/P total knee arthroplasty, left 08/17/2020   Inflammatory arthritis 10/10/2019   Perimenopausal disorder 11/28/2018   GAD (generalized anxiety disorder) 06/28/2018   Primary osteoarthritis of left knee 04/17/2018   Prediabetes 12/03/2017   Irritable bowel 03/19/2017   GERD without esophagitis 03/19/2017   Fibromyalgia 11/28/2016   Migraine without aura and without status  migrainosus, not intractable 10/17/2016   Essential hypertension 10/17/2016   Anxiety and depression 03/28/2016    Past Medical History:  Diagnosis Date   Allergy    Anemia    on iron- past hx   Anxiety    Arthritis    left knee   Asthma    COPD (chronic obstructive pulmonary disease) (HCC)    Depression    GERD (gastroesophageal reflux disease)    High cholesterol    Per patient   Hypertension    Neuromuscular disorder (HCC)    right arm   PTSD (post-traumatic stress disorder)    Per patient   Refusal of blood product     Family History  Problem Relation Age of Onset   Rheum arthritis Mother    Hypertension Mother    Depression Mother    Anxiety disorder Mother    Bipolar disorder Father    Mental illness Sister    Depression Sister    Congestive Heart Failure Maternal Grandfather    Colon cancer Neg Hx    Colon polyps Neg Hx    Esophageal cancer Neg Hx    Rectal cancer Neg Hx    Stomach cancer Neg Hx    Past Surgical History:  Procedure Laterality Date   DILATION AND CURETTAGE OF UTERUS     2002   TOTAL KNEE ARTHROPLASTY Left 08/17/2020   Procedure: LEFT TOTAL KNEE ARTHROPLASTY;  Surgeon: Cammy Copa, MD;  Location: [facility] OR;  Service: Orthopedics;  Laterality: Left;   UPPER GASTROINTESTINAL ENDOSCOPY     UPPER GI ENDOSCOPY     WISDOM TOOTH EXTRACTION     Social History   Social History Narrative   Not on file   Immunization History  Administered Date(s) Administered   Influenza,inj,Quad PF,6+ Mos 02/24/2016, 02/08/2017, 02/22/2018, 02/14/2019, 02/04/2020   Influenza-Unspecified 04/21/2021   PFIZER(Purple Top)SARS-COV-2 Vaccination 08/24/2019, 09/14/2019   Pfizer Covid-19 Vaccine Bivalent Booster 4yrs & up 04/26/2020, 11/11/2020   Pneumococcal Polysaccharide-23 02/04/2020   Tdap 11/28/2018     Objective: Vital Signs: LMP 05/16/2019    Physical Exam   Musculoskeletal Exam: ***  CDAI Exam: CDAI Score: -- Patient Global: --; Provider  Global: -- Swollen: --; Tender: -- Joint Exam 01/09/2022   No joint exam has been documented for this visit   There is currently no information documented on the homunculus. Go to the Rheumatology activity and complete the homunculus joint exam.  Investigation: No additional findings.  Imaging: XR C-ARM NO REPORT  Result Date: 12/20/2021 Please see Notes tab for imaging impression.  Epidural Steroid injection  Result Date: 12/20/2021 Tyrell Antonio, MD     12/28/2021  6:22 PM Lumbosacral Transforaminal Epidural Steroid Injection - Sub-Pedicular Approach with Fluoroscopic Guidance Patient: Kristi Decker     Date of Birth: 1975-08-29 MRN: [id number] PCP: Jac Canavan, PA-C     Visit Date: 12/20/2021  Universal Protocol:   Date/Time: 12/20/2021 Consent Given By: the patient Position: PRONE Additional Comments: Vital signs were monitored before and after the procedure. Patient was prepped and draped in the usual sterile fashion.  The correct patient, procedure, and site was verified. Injection Procedure Details: Procedure diagnoses: Lumbar radiculopathy [M54.16]  Meds Administered: Meds ordered this encounter Medications  methylPREDNISolone acetate (DEPO-MEDROL) injection 80 mg Laterality: Left Location/Site: L5 Needle:5.0 in., 22 ga.  Short bevel or Quincke spinal needle Needle Placement: Transforaminal Findings:   -Comments: Excellent flow of contrast along the nerve, nerve root and into the epidural space. Procedure Details: After squaring off the end-plates to get a true AP view, the C-arm was positioned so that an oblique view of the foramen as noted above was visualized. The target area is just inferior to the "nose of the scotty dog" or sub pedicular. The soft tissues overlying this structure were infiltrated with 2-3 ml. of 1% Lidocaine without Epinephrine. The spinal needle was inserted toward the target using a "trajectory" view along the fluoroscope beam.  Under AP and lateral  visualization, the needle was advanced so it did not puncture dura and was located close the 6 O'Clock position of the pedical in AP tracterory. Biplanar projections were used to confirm position. Aspiration was confirmed to be negative for CSF and/or blood. A 1-2 ml. volume of Isovue-250 was injected and flow of contrast was noted at each level. Radiographs were obtained for documentation purposes. After attaining the desired flow of contrast documented above, a 0.5 to 1.0 ml test dose of 0.25% Marcaine was injected into each respective transforaminal space.  The patient was observed for 90 seconds post injection.  After no sensory deficits were reported, and normal lower extremity motor function was noted,   the above injectate was administered so that equal amounts of the injectate were placed at each foramen (level) into the transforaminal epidural space. Additional Comments: The patient tolerated the procedure well Dressing: 2 x 2 sterile gauze and Band-Aid  Post-procedure details: Patient was observed during the procedure. Post-procedure instructions were reviewed. Patient left the clinic in stable condition.    Recent Labs: Lab Results  Component Value Date   WBC 5.3 11/08/2021   HGB 11.5 (L) 11/08/2021   PLT 268 11/08/2021   NA 140 11/08/2021   K 3.6 11/08/2021   CL 108 11/08/2021   CO2 25 11/08/2021   GLUCOSE 181 (H) 11/08/2021   BUN 11 11/08/2021   CREATININE 0.53 11/08/2021   BILITOT 0.3 11/08/2021   ALKPHOS 106 11/08/2021   AST 17 11/08/2021   ALT 27 11/08/2021   PROT 6.6 11/08/2021   ALBUMIN 3.6 11/08/2021   CALCIUM 9.1 11/08/2021    Speciality Comments: No specialty comments available.  Procedures:  No procedures performed Allergies: Patient has no known allergies.   Assessment / Plan:     Visit Diagnoses: No diagnosis found.  ***  Orders: No orders of the defined types were placed in this encounter.  No orders of the defined types were placed in this  encounter.    Follow-Up Instructions: No follow-ups on file.   Jairo Ben, RT  Note - This record has been created using [employer].  Chart creation errors have been sought, but may not always  have been located. Such creation errors do not reflect on  the standard of medical care.

## 2022-01-09 ENCOUNTER — Ambulatory Visit: Payer: 59 | Admitting: Internal Medicine

## 2022-01-09 DIAGNOSIS — M797 Fibromyalgia: Secondary | ICD-10-CM

## 2022-01-09 DIAGNOSIS — G8929 Other chronic pain: Secondary | ICD-10-CM

## 2022-01-09 DIAGNOSIS — M1990 Unspecified osteoarthritis, unspecified site: Secondary | ICD-10-CM

## 2022-01-09 DIAGNOSIS — Z79899 Other long term (current) drug therapy: Secondary | ICD-10-CM

## 2022-01-10 NOTE — Progress Notes (Unsigned)
Office Visit Note  Patient: Kristi Decker             Date of Birth: 08-Oct-1975           MRN: [id number]             PCP: Jac Canavan, PA-C Referring: Lexine Baton Visit Date: 01/11/2022   Subjective:  No chief complaint on file.   History of Present Illness: Kristi Decker is a 46 y.o. 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 ***   Previous HPI 10/04/21 Kristi Decker is a 46 y.o. female here for follow up for seronegative RA on sulfasalazine 1000 mg BID and diclofenac 75 mg BID.  She had to temporarily interrupt medications for colonoscopy since her last visit and felt severe worsening of joint pains in all of her body pain within days of stopping the medications.  These are overall improved again since restarting.  She saw Dr. Cyndie Chime for low back epidural steroid injection no complications from this but has not noticed a significant improvement in low back symptoms.  She was recommended this may require trial of up to 2 or 3 shots to determine efficacy.  She is trying to remain active doing stretches for her back and is working on weight reduction trying to decrease carbohydrates from the diet.  She was referred to allergy and asthma clinic for allergy testing.   Previous HPI 07/05/2021 Kristi Decker is a 46 y.o. 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 She still has a lot of joint pain bothering her especially in her back. She is frustrated by persistent of pain and stiffness limiting physical activities. She does feel there is ongoing benefit with sulfasalazine but not helping her back very much. She saw Dr. August Saucer for this problem about 2 weeks ago on 06/23/21 trying conservative treatment with tylenol 3 and robaxin but has not felt a good relief with this so far.   Previous HPI 11/16/20 Kristi Decker is a 46 y.o. female here for chronic polyarticular  inflammatory joint pain.  She was previously diagnosed with seronegative rheumatoid arthritis since about 2020 with previous treatment on oral diclofenac and sulfasalazine with apparently a significant response to treatment.  She also has chronic degenerative arthritis of multiple areas including the back for which she is undergone ESI and her left knee that underwent total knee arthroplasty with Dr. August Saucer earlier this year with a very great improvement in joint pain symptoms.  She discontinued her medication perioperatively and felt that her all over joint pains worsened very noticeably with treatment interruption.  She does also have history for suspected fibromyalgia contributing to generalized joint pains as well as associated symptoms of chronic fatigue and unrefreshing sleep, exertion intolerance worsening symptoms for a day longer afterwards, episodic all over pain that feels like having the flu, previous migraine headaches that have improved, and frequent feeling of cognitive impairment or difficulty.    No Rheumatology ROS completed.   PMFS History:  Patient Active Problem List   Diagnosis Date Noted   Mixed hyperlipidemia 11/14/2021   Wheezing 11/07/2021   COVID-19 virus infection 11/07/2021   Hypoxemia 11/07/2021   Acute hypoxemic respiratory failure (HCC) 11/07/2021   Hypokalemia 11/07/2021   Low back pain 10/04/2021   Environmental and seasonal allergies 08/26/2021   Seasonal allergic rhinitis 08/26/2021   Body mass index (BMI)  of 32.0 to 32.9 in adult 08/02/2021   High risk medication use 07/05/2021   Moderate persistent asthma without complication [id number]   S/P total knee arthroplasty, left 08/17/2020   Inflammatory arthritis 10/10/2019   Perimenopausal disorder 11/28/2018   GAD (generalized anxiety disorder) 06/28/2018   Primary osteoarthritis of left knee 04/17/2018   Prediabetes 12/03/2017   Irritable bowel 03/19/2017   GERD without esophagitis 03/19/2017    Fibromyalgia 11/28/2016   Migraine without aura and without status migrainosus, not intractable 10/17/2016   Essential hypertension 10/17/2016   Anxiety and depression 03/28/2016    Past Medical History:  Diagnosis Date   Allergy    Anemia    on iron- past hx   Anxiety    Arthritis    left knee   Asthma    COPD (chronic obstructive pulmonary disease) (HCC)    Depression    GERD (gastroesophageal reflux disease)    High cholesterol    Per patient   Hypertension    Neuromuscular disorder ([place])    right arm   PTSD (post-traumatic stress disorder)    Per patient   Refusal of blood product     Family History  Problem Relation Age of Onset   Rheum arthritis Mother    Hypertension Mother    Depression Mother    Anxiety disorder Mother    Bipolar disorder Father    Mental illness Sister    Depression Sister    Congestive Heart Failure Maternal Grandfather    Colon cancer Neg Hx    Colon polyps Neg Hx    Esophageal cancer Neg Hx    Rectal cancer Neg Hx    Stomach cancer Neg Hx    Past Surgical History:  Procedure Laterality Date   DILATION AND CURETTAGE OF UTERUS     2002   TOTAL KNEE ARTHROPLASTY Left 08/17/2020   Procedure: LEFT TOTAL KNEE ARTHROPLASTY;  Surgeon: Meredith Pel, MD;  Location: [facility];  Service: Orthopedics;  Laterality: Left;   UPPER GASTROINTESTINAL ENDOSCOPY     UPPER GI ENDOSCOPY     WISDOM TOOTH EXTRACTION     Social History   Social History Narrative   Not on file   Immunization History  Administered Date(s) Administered   Influenza,inj,Quad PF,6+ Mos 02/24/2016, 02/08/2017, 02/22/2018, 02/14/2019, 02/04/2020   Influenza-Unspecified 04/21/2021   PFIZER(Purple Top)SARS-COV-2 Vaccination 08/24/2019, 09/14/2019   Pfizer Covid-19 Vaccine Bivalent Booster 47yrs & up 04/26/2020, 11/11/2020   Pneumococcal Polysaccharide-23 02/04/2020   Tdap 11/28/2018     Objective: Vital Signs: LMP 05/16/2019    Physical Exam   Musculoskeletal Exam:  ***  CDAI Exam: CDAI Score: -- Patient Global: --; Provider Global: -- Swollen: --; Tender: -- Joint Exam 01/11/2022   No joint exam has been documented for this visit   There is currently no information documented on the homunculus. Go to the Rheumatology activity and complete the homunculus joint exam.  Investigation: No additional findings.  Imaging: XR C-ARM NO REPORT  Result Date: 12/20/2021 Please see Notes tab for imaging impression.  Epidural Steroid injection  Result Date: 12/20/2021 Magnus Sinning, MD     12/28/2021  6:22 PM Lumbosacral Transforaminal Epidural Steroid Injection - Sub-Pedicular Approach with Fluoroscopic Guidance Patient: Shakeyla Toki     Date of Birth: [birth year] MRN: [id number] PCP: Carlena Hurl, PA-C     Visit Date: 12/20/2021  Universal Protocol:   Date/Time: 12/20/2021 Consent Given By: the patient Position: PRONE Additional Comments: Vital signs were monitored before and after the  procedure. Patient was prepped and draped in the usual sterile fashion. The correct patient, procedure, and site was verified. Injection Procedure Details: Procedure diagnoses: Lumbar radiculopathy [M54.16]  Meds Administered: Meds ordered this encounter Medications  methylPREDNISolone acetate (DEPO-MEDROL) injection 80 mg Laterality: Left Location/Site: L5 Needle:5.0 in., 22 ga.  Short bevel or Quincke spinal needle Needle Placement: Transforaminal Findings:   -Comments: Excellent flow of contrast along the nerve, nerve root and into the epidural space. Procedure Details: After squaring off the end-plates to get a true AP view, the C-arm was positioned so that an oblique view of the foramen as noted above was visualized. The target area is just inferior to the "nose of the scotty dog" or sub pedicular. The soft tissues overlying this structure were infiltrated with 2-3 ml. of 1% Lidocaine without Epinephrine. The spinal needle was inserted toward the target using a "trajectory"  view along the fluoroscope beam.  Under AP and lateral visualization, the needle was advanced so it did not puncture dura and was located close the 6 O'Clock position of the pedical in AP tracterory. Biplanar projections were used to confirm position. Aspiration was confirmed to be negative for CSF and/or blood. A 1-2 ml. volume of Isovue-250 was injected and flow of contrast was noted at each level. Radiographs were obtained for documentation purposes. After attaining the desired flow of contrast documented above, a 0.5 to 1.0 ml test dose of 0.25% Marcaine was injected into each respective transforaminal space.  The patient was observed for 90 seconds post injection.  After no sensory deficits were reported, and normal lower extremity motor function was noted,   the above injectate was administered so that equal amounts of the injectate were placed at each foramen (level) into the transforaminal epidural space. Additional Comments: The patient tolerated the procedure well Dressing: 2 x 2 sterile gauze and Band-Aid  Post-procedure details: Patient was observed during the procedure. Post-procedure instructions were reviewed. Patient left the clinic in stable condition.    Recent Labs: Lab Results  Component Value Date   WBC 5.3 11/08/2021   HGB 11.5 (L) 11/08/2021   PLT 268 11/08/2021   NA 140 11/08/2021   K 3.6 11/08/2021   CL 108 11/08/2021   CO2 25 11/08/2021   GLUCOSE 181 (H) 11/08/2021   BUN 11 11/08/2021   CREATININE 0.53 11/08/2021   BILITOT 0.3 11/08/2021   ALKPHOS 106 11/08/2021   AST 17 11/08/2021   ALT 27 11/08/2021   PROT 6.6 11/08/2021   ALBUMIN 3.6 11/08/2021   CALCIUM 9.1 11/08/2021    Speciality Comments: No specialty comments available.  Procedures:  No procedures performed Allergies: Patient has no known allergies.   Assessment / Plan:     Visit Diagnoses: No diagnosis found.  ***  Orders: No orders of the defined types were placed in this encounter.  No orders  of the defined types were placed in this encounter.    Follow-Up Instructions: No follow-ups on file.   Fuller Plan, MD  Note - This record has been created using [employer].  Chart creation errors have been sought, but may not always  have been located. Such creation errors do not reflect on  the standard of medical care.

## 2022-01-11 ENCOUNTER — Ambulatory Visit: Payer: 59 | Attending: Internal Medicine | Admitting: Internal Medicine

## 2022-01-11 ENCOUNTER — Encounter: Payer: Self-pay | Admitting: Internal Medicine

## 2022-01-11 VITALS — BP 113/69 | HR 102 | Resp 16 | Ht 66.5 in | Wt 206.0 lb

## 2022-01-11 DIAGNOSIS — M1990 Unspecified osteoarthritis, unspecified site: Secondary | ICD-10-CM

## 2022-01-11 DIAGNOSIS — Z79899 Other long term (current) drug therapy: Secondary | ICD-10-CM

## 2022-01-11 DIAGNOSIS — G8929 Other chronic pain: Secondary | ICD-10-CM

## 2022-01-11 DIAGNOSIS — M5442 Lumbago with sciatica, left side: Secondary | ICD-10-CM

## 2022-01-11 LAB — CBC WITH DIFFERENTIAL/PLATELET
Absolute Monocytes: 451 cells/uL (ref 200–950)
Basophils Absolute: 22 cells/uL (ref 0–200)
Basophils Relative: 0.4 %
Eosinophils Absolute: 171 cells/uL (ref 15–500)
Eosinophils Relative: 3.1 %
HCT: 39.8 % (ref 35.0–45.0)
Hemoglobin: 13.4 g/dL (ref 11.7–15.5)
Lymphs Abs: 2085 cells/uL (ref 850–3900)
MCH: 31.2 pg (ref 27.0–33.0)
MCHC: 33.7 g/dL (ref 32.0–36.0)
MCV: 92.6 fL (ref 80.0–100.0)
MPV: 9.2 fL (ref 7.5–12.5)
Monocytes Relative: 8.2 %
Neutro Abs: 2772 cells/uL (ref 1500–7800)
Neutrophils Relative %: 50.4 %
Platelets: 405 10*3/uL — ABNORMAL HIGH (ref 140–400)
RBC: 4.3 10*6/uL (ref 3.80–5.10)
RDW: 13.2 % (ref 11.0–15.0)
Total Lymphocyte: 37.9 %
WBC: 5.5 10*3/uL (ref 3.8–10.8)

## 2022-01-11 LAB — COMPLETE METABOLIC PANEL WITH GFR
AG Ratio: 1.6 (calc) (ref 1.0–2.5)
ALT: 23 U/L (ref 6–29)
AST: 16 U/L (ref 10–35)
Albumin: 4.3 g/dL (ref 3.6–5.1)
Alkaline phosphatase (APISO): 143 U/L — ABNORMAL HIGH (ref 31–125)
BUN: 18 mg/dL (ref 7–25)
CO2: 24 mmol/L (ref 20–32)
Calcium: 9.5 mg/dL (ref 8.6–10.2)
Chloride: 105 mmol/L (ref 98–110)
Creat: 0.64 mg/dL (ref 0.50–0.99)
Globulin: 2.7 g/dL (calc) (ref 1.9–3.7)
Glucose, Bld: 106 mg/dL — ABNORMAL HIGH (ref 65–99)
Potassium: 4 mmol/L (ref 3.5–5.3)
Sodium: 138 mmol/L (ref 135–146)
Total Bilirubin: 0.4 mg/dL (ref 0.2–1.2)
Total Protein: 7 g/dL (ref 6.1–8.1)
eGFR: 110 mL/min/{1.73_m2} (ref >=60)

## 2022-01-11 LAB — SEDIMENTATION RATE: Sed Rate: 2 mm/h (ref 0–20)

## 2022-01-11 MED ORDER — DICLOFENAC SODIUM 75 MG PO TBEC: 75.0000 mg | DELAYED_RELEASE_TABLET | Freq: Two times a day (BID) | ORAL | 2 refills | Status: DC | PRN

## 2022-01-11 MED ORDER — SULFASALAZINE 500 MG PO TABS: 1000.0000 mg | ORAL_TABLET | Freq: Two times a day (BID) | ORAL | Status: DC

## 2022-01-12 NOTE — Progress Notes (Signed)
Lab results look fine for continuing the current sulfasalazine dose.  Sedimentation rate remains normal.

## 2022-01-13 ENCOUNTER — Telehealth: Payer: Self-pay | Admitting: Medical

## 2022-01-13 NOTE — Telephone Encounter (Signed)
Pt called in and would like to come in for her labs the morning of her physical 01/17/22 and come back for the physical in the afternoon. (Just want to let you know)

## 2022-01-17 ENCOUNTER — Ambulatory Visit (INDEPENDENT_AMBULATORY_CARE_PROVIDER_SITE_OTHER): Payer: Commercial Managed Care - HMO | Admitting: Medical

## 2022-01-17 ENCOUNTER — Encounter: Payer: Self-pay | Admitting: Medical

## 2022-01-17 VITALS — BP 120/70 | HR 64 | Ht 66.5 in | Wt 208.0 lb

## 2022-01-17 DIAGNOSIS — Z131 Encounter for screening for diabetes mellitus: Secondary | ICD-10-CM

## 2022-01-17 DIAGNOSIS — Z23 Encounter for immunization: Secondary | ICD-10-CM | POA: Diagnosis not present

## 2022-01-17 DIAGNOSIS — Z8616 Personal history of COVID-19: Secondary | ICD-10-CM

## 2022-01-17 DIAGNOSIS — R5383 Other fatigue: Secondary | ICD-10-CM

## 2022-01-17 DIAGNOSIS — F419 Anxiety disorder, unspecified: Secondary | ICD-10-CM

## 2022-01-17 DIAGNOSIS — J3089 Other allergic rhinitis: Secondary | ICD-10-CM | POA: Diagnosis not present

## 2022-01-17 DIAGNOSIS — I10 Essential (primary) hypertension: Secondary | ICD-10-CM

## 2022-01-17 DIAGNOSIS — K219 Gastro-esophageal reflux disease without esophagitis: Secondary | ICD-10-CM

## 2022-01-17 DIAGNOSIS — Z96652 Presence of left artificial knee joint: Secondary | ICD-10-CM

## 2022-01-17 DIAGNOSIS — R232 Flushing: Secondary | ICD-10-CM

## 2022-01-17 DIAGNOSIS — F32A Depression, unspecified: Secondary | ICD-10-CM

## 2022-01-17 DIAGNOSIS — E611 Iron deficiency: Secondary | ICD-10-CM

## 2022-01-17 DIAGNOSIS — Z79899 Other long term (current) drug therapy: Secondary | ICD-10-CM

## 2022-01-17 DIAGNOSIS — Z0000 Encounter for general adult medical examination without abnormal findings: Secondary | ICD-10-CM | POA: Diagnosis not present

## 2022-01-17 DIAGNOSIS — M1990 Unspecified osteoarthritis, unspecified site: Secondary | ICD-10-CM

## 2022-01-17 DIAGNOSIS — F411 Generalized anxiety disorder: Secondary | ICD-10-CM

## 2022-01-17 DIAGNOSIS — G43009 Migraine without aura, not intractable, without status migrainosus: Secondary | ICD-10-CM

## 2022-01-17 DIAGNOSIS — N959 Unspecified menopausal and perimenopausal disorder: Secondary | ICD-10-CM

## 2022-01-17 DIAGNOSIS — M1380 Other specified arthritis, unspecified site: Secondary | ICD-10-CM

## 2022-01-17 DIAGNOSIS — R7303 Prediabetes: Secondary | ICD-10-CM

## 2022-01-17 DIAGNOSIS — J4540 Moderate persistent asthma, uncomplicated: Secondary | ICD-10-CM

## 2022-01-17 DIAGNOSIS — E559 Vitamin D deficiency, unspecified: Secondary | ICD-10-CM

## 2022-01-17 DIAGNOSIS — E782 Mixed hyperlipidemia: Secondary | ICD-10-CM

## 2022-01-17 NOTE — Progress Notes (Signed)
Subjective:   HPI  Kristi AstValerie Decker is a 46 y.o. female who presents for Chief Complaint  Patient presents with   fasting cpe    Fasting cpe, had labs drawn this morning,     Patient Care Team: Vannia Pola, Cleda Mccreedyavid S, PA-C as PCP - General (Family Medicine) Fuller Planice, Christopher W, MD as Consulting Physician (Rheumatology) Dr. Tyrell AntonioFrederic Newton, orthopedics Dr. Dorene GrebeScott Dean, orthopedics Dr. Margo AyeShaylar Padgett, allergy/asthma Dr. Durel SaltsNikita Desai, pulmonology Dr. Tressia DanasKimberly Beavers, GI Not currently seeing eye doctor and dentist   Concerns: On hormone therapy was still having hot flashes.  Having some fatigue, does not sleep all that well.  Feels like she is got some hearing loss, occasional ringing in years.  Occasionally feels tremor in the right arm.  Worried about family history of aneurysm and CHF.  Great grandfather died in his 2340s of aneurysm in brain.   Maternal grandmother had aortic aneurysm.  She had COVID few months back and did horribly with this and had to be hospitalized.  She is compliant with her medications.  She sees orthopedics, GI, rheumatology, asthma and allergy doctor.   Reviewed their medical, surgical, family, social, medication, and allergy history and updated chart as appropriate.  Past Medical History:  Diagnosis Date   Allergy    Anemia    on iron- past hx   Anxiety    Arthritis    left knee   Asthma    Depression    GERD (gastroesophageal reflux disease)    High cholesterol    Per patient   Hypertension    Neuromuscular disorder (HCC)    right arm   PTSD (post-traumatic stress disorder)    Per patient   Refusal of blood product     Family History  Problem Relation Age of Onset   Rheum arthritis Mother    Hypertension Mother    Depression Mother    Anxiety disorder Mother    Bipolar disorder Father    Mental illness Sister    Depression Sister    Congestive Heart Failure Maternal Grandfather    Congestive Heart Failure Maternal Uncle     Congestive Heart Failure Maternal Uncle    Colon cancer Neg Hx    Colon polyps Neg Hx    Esophageal cancer Neg Hx    Rectal cancer Neg Hx    Stomach cancer Neg Hx      Current Outpatient Medications:    albuterol (PROVENTIL) (2.5 MG/3ML) 0.083% nebulizer solution, Take 3 mLs (2.5 mg total) by nebulization every 6 (six) hours as needed for wheezing or shortness of breath., Disp: 75 mL, Rfl: 5   albuterol (VENTOLIN HFA) 108 (90 Base) MCG/ACT inhaler, Inhale 2 puffs into the lungs every 6 (six) hours as needed for wheezing or shortness of breath., Disp: 18 g, Rfl: 5   atorvastatin (LIPITOR) 20 MG tablet, Take 1 tablet (20 mg total) by mouth daily., Disp: 90 tablet, Rfl: 1   Calcium-Magnesium-Zinc (CAL-MAG-ZINC PO), Take 1 tablet by mouth daily., Disp: , Rfl:    cholecalciferol (VITAMIN D3) 25 MCG (1000 UNIT) tablet, Take 1,000 Units by mouth daily., Disp: , Rfl:    clonazePAM (KLONOPIN) 0.5 MG tablet, Take 0.5 mg by mouth 2 (two) times daily as needed for anxiety., Disp: , Rfl:    Cyanocobalamin (VITAMIN B-12 PO), Take 1 tablet by mouth daily., Disp: , Rfl:    diclofenac (VOLTAREN) 75 MG EC tablet, Take 1 tablet (75 mg total) by mouth 2 (two) times daily as needed., Disp:  60 tablet, Rfl: 2   DULoxetine (CYMBALTA) 60 MG capsule, Take 60 mg by mouth daily., Disp: , Rfl:    ELDERBERRY PO, Take by mouth., Disp: , Rfl:    estradiol (ESTRACE) 0.5 MG tablet, Take 1 tablet (0.5 mg total) by mouth daily., Disp: 30 tablet, Rfl: 11   ferrous sulfate 325 (65 FE) MG tablet, Take 325 mg by mouth daily., Disp: , Rfl:    lamoTRIgine (LAMICTAL) 25 MG tablet, Take 75 mg by mouth daily., Disp: , Rfl:    lisinopril-hydrochlorothiazide (ZESTORETIC) 20-12.5 MG tablet, Take 1 tablet by mouth daily., Disp: 90 tablet, Rfl: 1   pantoprazole (PROTONIX) 40 MG tablet, Take 1 tablet (40 mg total) by mouth daily., Disp: 90 tablet, Rfl: 1   progesterone (PROMETRIUM) 100 MG capsule, Take 1 capsule (100 mg total) by mouth at  bedtime., Disp: 30 capsule, Rfl: 12   sulfaSALAzine (AZULFIDINE) 500 MG tablet, Take 2 tablets (1,000 mg total) by mouth 2 (two) times daily. 2 tablets in the AM - 2 tablets in the PM, Disp: , Rfl:    SYMBICORT 160-4.5 MCG/ACT inhaler, INHALE 2 PUFFS INTO THE LUNGS TWO TIMES A DAY AS NEEDED FOR SHORTNESS OF BREATH/WHEEZING, Disp: 10.2 g, Rfl: 5  No Known Allergies    Review of Systems Constitutional: -fever, -chills, -sweats, -unexpected weight change, -decreased appetite, -fatigue Allergy: -sneezing, -itching, -congestion Dermatology: -changing moles, --rash, -lumps ENT: -runny nose, -ear pain, -sore throat, -hoarseness, -sinus pain, -teeth pain, - ringing in ears, -hearing loss, -nosebleeds Cardiology: -chest pain, -palpitations, -swelling, -difficulty breathing when lying flat, -waking up short of breath Respiratory: -cough, -shortness of breath, -difficulty breathing with exercise or exertion, -wheezing, -coughing up blood Gastroenterology: -abdominal pain, -nausea, -vomiting, -diarrhea, -constipation, -blood in stool, -changes in bowel movement, -difficulty swallowing or eating Hematology: -bleeding, -bruising  Musculoskeletal: -joint aches, -muscle aches, -joint swelling, -back pain, -neck pain, -cramping, -changes in gait Ophthalmology: denies vision changes, eye redness, itching, discharge Urology: -burning with urination, -difficulty urinating, -blood in urine, -urinary frequency, -urgency, -incontinence Neurology: -headache, -weakness, -tingling, -numbness, -memory loss, -falls, -dizziness Psychology: -depressed mood, -agitation, -sleep problems Breast/gyn: -breast tendnerss, -discharge, -lumps, -vaginal discharge,- irregular periods, -heavy periods      01/17/2022    3:13 PM 08/02/2021   10:34 AM 01/28/2021    9:59 AM 09/17/2020    9:55 AM 09/03/2020   11:40 AM  Depression screen PHQ 2/9  Decreased Interest 0 0 0 0 0  Down, Depressed, Hopeless 0 0 0 0 0  PHQ - 2 Score 0 0 0 0 0   Altered sleeping    1   Tired, decreased energy    0   Change in appetite    0   Feeling bad or failure about yourself     0   Trouble concentrating    0   Moving slowly or fidgety/restless    0   Suicidal thoughts    0   PHQ-9 Score    1        Objective:  BP 120/70   Pulse 64   Ht 5' 6.5" (1.689 m)   Wt 208 lb (94.3 kg)   LMP 05/16/2019   BMI 33.07 kg/m   Wt Readings from Last 3 Encounters:  01/17/22 208 lb (94.3 kg)  01/11/22 206 lb (93.4 kg)  12/16/21 205 lb 9.6 oz (93.3 kg)   General appearance: alert, no distress, WD/WN, Caucasian female Skin: unremarkable, few scratches of legs from recent excoriation and flea bites  HEENT: normocephalic, conjunctiva/corneas normal, sclerae anicteric, PERRLA, EOMi, nares patent, no discharge or erythema, pharynx normal Oral cavity: MMM, tongue normal, teeth normal Neck: supple, no lymphadenopathy, no thyromegaly, no masses, normal ROM, no bruits Chest: non tender, normal shape and expansion Heart: RRR, normal S1, S2, no murmurs Lungs: CTA bilaterally, no wheezes, rhonchi, or rales Abdomen: +bs, soft, non tender, non distended, no masses, no hepatomegaly, no splenomegaly, no bruits Back: non tender, normal ROM, no scoliosis Musculoskeletal: left knee anterior surgical scar, upper extremities non tender, no obvious deformity, normal ROM throughout, lower extremities non tender, no obvious deformity, normal ROM throughout Extremities: no edema, no cyanosis, no clubbing Pulses: 2+ symmetric, upper and lower extremities, normal cap refill Neurological: alert, oriented x 3, CN2-12 intact, strength normal upper extremities and lower extremities, sensation normal throughout, DTRs 2+ throughout, no cerebellar signs, gait normal Psychiatric: normal affect, behavior normal, pleasant  Breast/gyn/rectal - deferred to gynecology     Assessment and Plan :   Encounter Diagnoses  Name Primary?   Encounter for health maintenance examination in  adult    Needs flu shot Yes   Screening for diabetes mellitus    Anxiety and depression    Environmental and seasonal allergies    Essential hypertension    GAD (generalized anxiety disorder)    GERD without esophagitis    High risk medication use    Inflammatory arthritis    S/P total knee arthroplasty, left    Prediabetes    Perimenopausal disorder    Moderate persistent asthma without complication    Mixed hyperlipidemia    Migraine without aura and without status migrainosus, not intractable    History of COVID-19    Iron deficiency    Vitamin D deficiency    Hot flashes    Other fatigue      This visit was a preventative care visit, also known as wellness visit or routine physical.   Topics typically include healthy lifestyle, diet, exercise, preventative care, vaccinations, sick and well care, proper use of emergency dept and after hours care, as well as other concerns.     Recommendations: Continue to return yearly for your annual wellness and preventative care visits.  This gives [place] a chance to discuss healthy lifestyle, exercise, vaccinations, review your chart record, and perform screenings where appropriate.  I recommend you see your eye doctor yearly for routine vision care.  I recommend you see your dentist yearly for routine dental care including hygiene visits twice yearly.   Vaccination recommendations were reviewed Immunization History  Administered Date(s) Administered   Influenza,inj,Quad PF,6+ Mos 02/24/2016, 02/08/2017, 02/22/2018, 02/14/2019, 02/04/2020   Influenza-Unspecified 04/21/2021   PFIZER(Purple Top)SARS-COV-2 Vaccination 08/24/2019, 09/14/2019   Pfizer Covid-19 Vaccine Bivalent Booster 76yrs & up 04/26/2020, 11/11/2020   Pneumococcal Polysaccharide-23 02/04/2020   Tdap 11/28/2018    Counseled on the influenza virus vaccine.  Vaccine information sheet given.  Influenza vaccine given after consent obtained.   Screening for cancer: Colon  cancer screening: I reviewed your colonoscopy on file that is up to date from 09/2021  Breast cancer screening: You should perform a self breast exam monthly.   We reviewed recommendations for regular mammograms and breast cancer screening.  Cervical cancer screening: We reviewed recommendations for pap smear screening.   Skin cancer screening: Check your skin regularly for new changes, growing lesions, or other lesions of concern Come in for evaluation if you have skin lesions of concern.  Lung cancer screening: If you have a greater than 20  pack year history of tobacco use, then you may qualify for lung cancer screening with a chest CT scan.   Please call your insurance company to inquire about coverage for this test.  We currently don't have screenings for other cancers besides breast, cervical, colon, and lung cancers.  If you have a strong family history of cancer or have other cancer screening concerns, please let me know.    Bone health: Get at least 150 minutes of aerobic exercise weekly Get weight bearing exercise at least once weekly Bone density test:  A bone density test is an imaging test that uses a type of X-ray to measure the amount of calcium and other minerals in your bones. The test may be used to diagnose or screen you for a condition that causes weak or thin bones (osteoporosis), predict your risk for a broken bone (fracture), or determine how well your osteoporosis treatment is working. The bone density test is recommended for females 65 and older, or females or males <65 if certain risk factors such as thyroid disease, long term use of steroids such as for asthma or rheumatological issues, vitamin D deficiency, estrogen deficiency, family history of osteoporosis, self or family history of fragility fracture in first degree relative.    Heart health: Get at least 150 minutes of aerobic exercise weekly Limit alcohol It is important to maintain a healthy blood  pressure and healthy cholesterol numbers  Heart disease screening: Screening for heart disease includes screening for blood pressure, fasting lipids, glucose/diabetes screening, BMI height to weight ratio, reviewed of smoking status, physical activity, and diet.    Goals include blood pressure 120/80 or less, maintaining a healthy lipid/cholesterol profile, preventing diabetes or keeping diabetes numbers under good control, not smoking or using tobacco products, exercising most days per week or at least 150 minutes per week of exercise, and eating healthy variety of fruits and vegetables, healthy oils, and avoiding unhealthy food choices like fried food, fast food, high sugar and high cholesterol foods.    Other tests may possibly include EKG test, CT coronary calcium score, echocardiogram, exercise treadmill stress test.   Medical care options: I recommend you continue to seek care here first for routine care.  We try really hard to have available appointments Monday through Friday daytime hours for sick visits, acute visits, and physicals.  Urgent care should be used for after hours and weekends for significant issues that cannot wait till the next day.  The emergency department should be used for significant potentially life-threatening emergencies.  The emergency department is expensive, can often have long wait times for less significant concerns, so try to utilize primary care, urgent care, or telemedicine when possible to avoid unnecessary trips to the emergency department.  Virtual visits and telemedicine have been introduced since the pandemic started in 2020, and can be convenient ways to receive medical care.  We offer virtual appointments as well to assist you in a variety of options to seek medical care.   Advanced Directives: I recommend you consider completing a Health Care Power of Attorney and Living Will.   These documents respect your wishes and help alleviate burdens on your loved  ones if you were to become terminally ill or be in a position to need those documents enforced.    You can complete Advanced Directives yourself, have them notarized, then have copies made for our office, for you and for anybody you feel should have them in safe keeping.  Or, you can have  an attorney prepare these documents.   If you haven't updated your Last Will and Testament in a while, it may be worthwhile having an attorney prepare these documents together and save on some costs.       Separate significant issues discussed: Given her several concerns, updated labs today  Consider sleep study  Consider ENT consult for hearing loss  Hypertension-continue current medications  Hyperlipidemia-continue statin  Asthma and allergies-sees asthma Dr., On therapy including Symbicort, albuterol as needed  History of iron deficiency, taking iron daily.  Updated labs today to check iron levels  History of vitamin D deficiency-currently on supplement, labs today  Anxiety and depression-  History rheumatoid arthritis-sees rheumatoid, they manage several of her medication  Hot flashes, menopausal symptoms - labs today, f/u with gynecology  No obvious tremor today but discussed cause of mild physiological tremor   Kristi Decker was seen today for fasting cpe.  Diagnoses and all orders for this visit:  Needs flu shot -     Flu Vaccine QUAD 66mo+IM (Fluarix, Fluzone & Alfiuria Quad PF)  Encounter for health maintenance examination in adult -     Lipid panel -     Hemoglobin A1c -     VITAMIN D 25 Hydroxy (Vit-D Deficiency, Fractures) -     Iron, TIBC and Ferritin Panel -     TSH -     Vitamin B12  Screening for diabetes mellitus  Anxiety and depression  Environmental and seasonal allergies  Essential hypertension  GAD (generalized anxiety disorder)  GERD without esophagitis  High risk medication use  Inflammatory arthritis  S/P total knee arthroplasty, left  Prediabetes -      Hemoglobin A1c  Perimenopausal disorder  Moderate persistent asthma without complication  Mixed hyperlipidemia -     Lipid panel  Migraine without aura and without status migrainosus, not intractable  History of COVID-19  Iron deficiency -     Iron, TIBC and Ferritin Panel -     Vitamin B12  Vitamin D deficiency -     VITAMIN D 25 Hydroxy (Vit-D Deficiency, Fractures)  Hot flashes  Other fatigue    Follow-up pending labs, yearly for physical

## 2022-01-17 NOTE — Telephone Encounter (Signed)
Pt already came in for labs this morning. Kristi Decker has drawn 2 tubes and what you order should be good enough

## 2022-01-18 LAB — IRON,TIBC AND FERRITIN PANEL
Ferritin: 62 ng/mL (ref 15–150)
Iron Saturation: 29 % (ref 15–55)
Iron: 97 ug/dL (ref 27–159)
Total Iron Binding Capacity: 336 ug/dL (ref 250–450)
UIBC: 239 ug/dL (ref 131–425)

## 2022-01-18 LAB — HEMOGLOBIN A1C
Est. average glucose Bld gHb Est-mCnc: 105 mg/dL
Hgb A1c MFr Bld: 5.3 % (ref 4.8–5.6)

## 2022-01-18 LAB — VITAMIN D 25 HYDROXY (VIT D DEFICIENCY, FRACTURES): Vit D, 25-Hydroxy: 32 ng/mL (ref 30.0–100.0)

## 2022-01-18 LAB — LIPID PANEL
Chol/HDL Ratio: 6.2 ratio — ABNORMAL HIGH (ref 0.0–4.4)
Cholesterol, Total: 224 mg/dL — ABNORMAL HIGH (ref 100–199)
HDL: 36 mg/dL — ABNORMAL LOW (ref >39)
LDL Chol Calc (NIH): 134 mg/dL — ABNORMAL HIGH (ref 0–99)
Triglycerides: 302 mg/dL — ABNORMAL HIGH (ref 0–149)
VLDL Cholesterol Cal: 54 mg/dL — ABNORMAL HIGH (ref 5–40)

## 2022-01-18 LAB — TSH: TSH: 1.93 u[IU]/mL (ref 0.450–4.500)

## 2022-01-19 ENCOUNTER — Other Ambulatory Visit: Payer: Self-pay | Admitting: Medical

## 2022-01-19 DIAGNOSIS — J4520 Mild intermittent asthma, uncomplicated: Secondary | ICD-10-CM

## 2022-01-19 MED ORDER — ATORVASTATIN CALCIUM 40 MG PO TABS: 40.0000 mg | ORAL_TABLET | Freq: Every day | ORAL | 11 refills | Status: AC

## 2022-01-19 MED ORDER — VITAMIN D 50 MCG (2000 UT) PO CAPS: 1.0000 | ORAL_CAPSULE | Freq: Every day | ORAL | 3 refills | Status: AC

## 2022-01-19 MED ORDER — PANTOPRAZOLE SODIUM 40 MG PO TBEC
40.0000 mg | DELAYED_RELEASE_TABLET | Freq: Every day | ORAL | 3 refills | Status: DC
Start: 2022-01-19 — End: 2022-10-10

## 2022-01-19 MED ORDER — BUDESONIDE-FORMOTEROL FUMARATE 160-4.5 MCG/ACT IN AERO: INHALATION_SPRAY | RESPIRATORY_TRACT | 11 refills | Status: DC

## 2022-01-19 MED ORDER — LISINOPRIL-HYDROCHLOROTHIAZIDE 20-12.5 MG PO TABS: 1.0000 | ORAL_TABLET | Freq: Every day | ORAL | 3 refills | Status: DC

## 2022-01-20 LAB — SPECIMEN STATUS REPORT

## 2022-01-20 LAB — VITAMIN B12: Vitamin B-12: 1192 pg/mL (ref 232–1245)

## 2022-01-30 ENCOUNTER — Encounter: Payer: 59 | Admitting: Physician Assistant

## 2022-02-04 ENCOUNTER — Other Ambulatory Visit: Payer: Self-pay | Admitting: Internal Medicine

## 2022-02-04 DIAGNOSIS — M1990 Unspecified osteoarthritis, unspecified site: Secondary | ICD-10-CM

## 2022-02-06 ENCOUNTER — Telehealth: Payer: Self-pay | Admitting: Rheumatology

## 2022-02-06 ENCOUNTER — Telehealth: Payer: Self-pay | Admitting: Internal Medicine

## 2022-02-06 NOTE — Telephone Encounter (Signed)
error 

## 2022-02-06 NOTE — Telephone Encounter (Signed)
Spoke with patient letting her know that Dr. Benjamine Mola has filled her sulfasalazine Rx.

## 2022-02-06 NOTE — Telephone Encounter (Signed)
Next Visit: Due around 04/13/2022. Message sent to the front to schedule.   Last Visit: 01/11/2022  Last Fill: 01/11/2022  DX: seronegative RA.  Current Dose per office note 01/11/2022:  sulfasalazine 1000 mg twice daily   Labs: 01/11/2022 Lab results look fine for continuing the current sulfasalazine dose.  Sedimentation rate remains normal.  Okay to refill sulfasalazine?

## 2022-02-06 NOTE — Telephone Encounter (Signed)
Called patient and scheduled her 3 month follow-up appointment for 04/17/22.    Patient states she also requested a prescription refill for Sulfasalazine (30 day supply) to be sent to [facility] at [address].  Patient states the last prescription she picked up only had 8 tablets and thinks there might be some confusion regarding the prescription.

## 2022-02-10 ENCOUNTER — Encounter (HOSPITAL_COMMUNITY): Payer: Self-pay | Admitting: Emergency Medicine

## 2022-02-10 ENCOUNTER — Telehealth: Payer: Self-pay | Admitting: Internal Medicine

## 2022-02-10 ENCOUNTER — Other Ambulatory Visit: Payer: Self-pay

## 2022-02-10 ENCOUNTER — Emergency Department (HOSPITAL_COMMUNITY)
Admission: EM | Admit: 2022-02-10 | Discharge: 2022-02-11 | Disposition: A | Discharge status: Home / Self Care | Payer: Commercial Managed Care - HMO | Attending: Emergency Medicine | Admitting: Emergency Medicine

## 2022-02-10 ENCOUNTER — Ambulatory Visit (INDEPENDENT_AMBULATORY_CARE_PROVIDER_SITE_OTHER): Payer: Commercial Managed Care - HMO | Admitting: Medical

## 2022-02-10 VITALS — BP 130/80 | HR 87 | Temp 97.2°F

## 2022-02-10 DIAGNOSIS — S0185XA Open bite of other part of head, initial encounter: Secondary | ICD-10-CM

## 2022-02-10 DIAGNOSIS — S0993XA Unspecified injury of face, initial encounter: Secondary | ICD-10-CM | POA: Diagnosis present

## 2022-02-10 DIAGNOSIS — W540XXA Bitten by dog, initial encounter: Secondary | ICD-10-CM

## 2022-02-10 DIAGNOSIS — S0183XA Puncture wound without foreign body of other part of head, initial encounter: Secondary | ICD-10-CM | POA: Insufficient documentation

## 2022-02-10 DIAGNOSIS — G4734 Idiopathic sleep related nonobstructive alveolar hypoventilation: Secondary | ICD-10-CM

## 2022-02-10 DIAGNOSIS — G4733 Obstructive sleep apnea (adult) (pediatric): Secondary | ICD-10-CM

## 2022-02-10 DIAGNOSIS — S01459A Open bite of unspecified cheek and temporomandibular area, initial encounter: Secondary | ICD-10-CM | POA: Insufficient documentation

## 2022-02-10 DIAGNOSIS — S0181XA Laceration without foreign body of other part of head, initial encounter: Secondary | ICD-10-CM | POA: Diagnosis not present

## 2022-02-10 MED ORDER — AMOXICILLIN-POT CLAVULANATE 875-125 MG PO TABS
1.0000 | ORAL_TABLET | Freq: Once | ORAL | Status: AC
  Administered 2022-02-10: 1 via ORAL
  Filled 2022-02-10: qty 1

## 2022-02-10 MED ORDER — OXYCODONE-ACETAMINOPHEN 5-325 MG PO TABS
1.0000 | ORAL_TABLET | Freq: Once | ORAL | Status: AC
  Administered 2022-02-10: 1 via ORAL
  Filled 2022-02-10: qty 1

## 2022-02-10 MED ORDER — LIDOCAINE-EPINEPHRINE (PF) 2 %-1:200000 IJ SOLN
10.0000 mL | Freq: Once | INTRAMUSCULAR | Status: DC
  Filled 2022-02-10: qty 20

## 2022-02-10 NOTE — Patient Instructions (Addendum)
[facility] [address] [place], [place] [address] [phone]  Dr. Marcy Salvo

## 2022-02-10 NOTE — Progress Notes (Signed)
Subjective:  Kristi Decker is a 46 y.o. female who presents for Chief Complaint  Patient presents with   discuss sleep study    Discuss sleep study- also was just bitten by a pit bull in her face at her eye. Her eye is swelling and pain level is 20/10     Here to discuss sleep study.  She had a sleep study recently home sleep study on 01/25/2022.  Although she is here for sleep study, she walked in with an acute injury.  She was at her friend's place of business within the last hour.  Her friend's dog which is a pit bull just recently had puppies.  She had gotten close to see the puppies and the dog bit her in the face.  The dog is reportedly up-to-date on shots.  Kristi Decker reports pain and swelling in laceration to the left orbit  No other aggravating or relieving factors.    No other c/o.  The following portions of the patient's history were reviewed and updated as appropriate: allergies, current medications, past family history, past medical history, past social history, past surgical history and problem list.  ROS Otherwise as in subjective above  Objective: BP 130/80   Pulse 87   Temp (!) 97.2 F (36.2 C)   LMP 05/16/2019   Gen: wd, wn, nad Crying and upset   Physical Exam HENT:     Head:      Comments: Left face infraorbital area and between the lateral nose and infraorbital running superiorly is a laceration approximately 2.4 cm long, there is generalized puffy swelling of the left orbit and pinkish coloration of the left orbit inferiorly, there is also another laceration on the anterior chin just under the lip that is approximately 8 mm long, both are bleeding  No other obvious facial wound CN II through XII intact otherwise   Assessment: Encounter Diagnoses  Name Primary?   Facial laceration, initial encounter Yes   Dog bite of face, initial encounter    OSA (obstructive sleep apnea)    Nocturnal hypoxemia      Plan: Facial laceration, dog bite:  We  discussed the acute laceration and dog bite.  Given the wounds on the face and limited resources we have here to adequately suture the wounds, I recommended she go to the emergency department.  May or may not need plastic surgery consult.  She will report to the emergency department now  I did communicate briefly with Dr. Marcy Salvo, Plastic surgery.  He will be at [facility] so if plastics is needed, he is possibly available today  Last tetanus 11/28/2018  I placed a bandage to help cover and dress the wound so she can get to the emergency department   Sleep apnea, nocturnal hypoxemia We discussed her recent sleep study showing AHI 13.6 however she did have some oxygen desaturation, 81 minutes below 80% which was 24% of the time.  We discussed various ways to treat sleep apnea including weight loss, avoid supine position but she move forward with trial of CPAP.  Referral will be made  Kristi Decker was seen today for discuss sleep study.  Diagnoses and all orders for this visit:  Facial laceration, initial encounter  Dog bite of face, initial encounter  OSA (obstructive sleep apnea)  Nocturnal hypoxemia   Follow up: report to [facility] ED

## 2022-02-10 NOTE — Progress Notes (Signed)
I have sent order to [facility]

## 2022-02-10 NOTE — ED Provider Triage Note (Signed)
Emergency Medicine Provider Triage Evaluation Note  Kristi Decker , a 46 y.o. female  was evaluated in triage.  Pt complains of dog bite to the face.  Patient was bitten to the left eye.  She has a laceration by the left side of her nose.  The dog is up-to-date on his rabies vaccination.  Patient complains of pain and swelling around the eye but no vision changes.  She was sent up here for further evaluation..  Review of Systems  Positive: Laceration Negative: Vision change  Physical Exam  BP (!) 161/97 (BP Location: Right Arm)   Pulse 96   Temp 97.8 F (36.6 C) (Oral)   Resp 16   LMP 05/16/2019   SpO2 96%  Gen:   Awake, no distress   Resp:  Normal effort  MSK:   Moves extremities without difficulty  Other:  Bruising and swelling around the left eye, superficial laceration to the left side of the face  Medical Decision Making  Medically screening exam initiated at 2:47 PM.  Appropriate orders placed.  Kristi Decker was informed that the remainder of the evaluation will be completed by another provider, this initial triage assessment does not replace that evaluation, and the importance of remaining in the ED until their evaluation is complete.  Work-up initiated   Margarita Mail, PA-C 02/10/22 1449

## 2022-02-10 NOTE — Telephone Encounter (Signed)
I have called [facility] and notified them that patient is coming by private vehicle for Dog bite on face and Dr. Erin Hearing is aware if plastic surgery is needed as he is at [facility] today

## 2022-02-10 NOTE — ED Triage Notes (Signed)
Pt seen by PCP this morning to discuss her sleep study but was sent to ED due to an acute injury.    States she was bit by her friend's pit bull in the face this morning when she was looking at the dog's puppies.  Reports dog is up-to-date on immunizations and states her last DT was last year. C/o superficial laceration to L side of her nose under L eye.  Bruising and swelling around L eye.

## 2022-02-11 MED ORDER — DOXYCYCLINE HYCLATE 100 MG PO TABS
100.0000 mg | ORAL_TABLET | Freq: Once | ORAL | Status: AC
  Administered 2022-02-11: 100 mg via ORAL
  Filled 2022-02-11: qty 1

## 2022-02-11 MED ORDER — METRONIDAZOLE 500 MG PO TABS
500.0000 mg | ORAL_TABLET | Freq: Once | ORAL | Status: AC
Start: 2022-02-11 — End: 2022-02-11
  Administered 2022-02-11: 500 mg via ORAL
  Filled 2022-02-11: qty 1

## 2022-02-11 MED ORDER — BACITRACIN ZINC 500 UNIT/GM EX OINT: 1.0000 | TOPICAL_OINTMENT | Freq: Two times a day (BID) | CUTANEOUS | 0 refills | Status: DC

## 2022-02-11 MED ORDER — DOXYCYCLINE HYCLATE 100 MG PO CAPS: 100.0000 mg | ORAL_CAPSULE | Freq: Two times a day (BID) | ORAL | 0 refills | Status: DC

## 2022-02-11 MED ORDER — METRONIDAZOLE 500 MG PO TABS: 500.0000 mg | ORAL_TABLET | Freq: Two times a day (BID) | ORAL | 0 refills | Status: DC

## 2022-02-11 NOTE — ED Provider Notes (Signed)
  [facility] EMERGENCY DEPARTMENT Provider Note      I was asked by Dr. Kathrynn Humble to close the patient's facial laceration.  Procedures .Marland KitchenLaceration Repair  Date/Time: 02/11/2022 12:14 AM  Performed by: Montine Circle, PA-C Authorized by: Montine Circle, PA-C   Consent:    Consent obtained:  Verbal   Consent given by:  Patient   Risks, benefits, and alternatives were discussed: yes     Risks discussed:  Infection, pain, poor cosmetic result, poor wound healing and need for additional repair   Alternatives discussed:  Referral Universal protocol:    Procedure explained and questions answered to patient or proxy's satisfaction: yes     Relevant documents present and verified: yes     Test results available: yes     Imaging studies available: yes     Required blood products, implants, devices, and special equipment available: yes     Site/side marked: yes     Immediately prior to procedure, a time out was called: yes     Patient identity confirmed:  Verbally with patient Anesthesia:    Anesthesia method:  Local infiltration   Local anesthetic:  Lidocaine 1% WITH epi Laceration details:    Location:  Face   Face location:  Nose   Length (cm):  2 Exploration:    Wound exploration: wound explored through full range of motion and entire depth of wound visualized     Wound extent: no fascia violation noted and no muscle damage noted     Contaminated: no   Treatment:    Area cleansed with:  Povidone-iodine and saline   Amount of cleaning:  Extensive Skin repair:    Repair method:  Sutures   Suture size:  6-0   Suture material:  Prolene   Number of sutures:  3 Approximation:    Approximation:  Loose (loose closure due to dog bite, skin was not fully approximated) Repair type:    Repair type:  Simple Post-procedure details:    Dressing:  Antibiotic ointment   Procedure completion:  Tolerated well, no immediate complications      Montine Circle,  PA-C 02/11/22 0017    Varney Biles, MD 02/13/22 1846

## 2022-02-11 NOTE — Discharge Instructions (Signed)
We saw you in the ER for your WOUND.  Take the oral antibiotics as prescribed.  Apply topical antibiotics as well. Please read the instructions provided on wound care. Keep the area clean and dry, apply bacitracin ointment daily and take the medications provided. RETURN TO THE ER IF THERE IS INCREASED PAIN, REDNESS, PUS COMING OUT from the wound site.  We recommend that he follow-up with your primary care doctor in 5 days for recheck.  You may consider calling plastic surgery, their number has been provided.  Return to the ER if your symptoms get worse.

## 2022-02-11 NOTE — ED Provider Notes (Signed)
[facility] EMERGENCY DEPARTMENT Provider Note   CSN: [id number] Arrival date & time: 02/10/22  1411     History {Add pertinent medical, surgical, social history, OB history to HPI:1} Chief Complaint  Patient presents with   Animal Bite    Kristi Decker is a 46 y.o. female.  HPI    46 year old female comes in with chief complaint of animal bite  Home Medications Prior to Admission medications   Medication Sig Start Date End Date Taking? Authorizing Provider  bacitracin ointment Apply 1 Application topically 2 (two) times daily. 02/11/22  Yes Derwood Kaplan, MD  doxycycline (VIBRAMYCIN) 100 MG capsule Take 1 capsule (100 mg total) by mouth 2 (two) times daily. 02/11/22  Yes Derwood Kaplan, MD  metroNIDAZOLE (FLAGYL) 500 MG tablet Take 1 tablet (500 mg total) by mouth 2 (two) times daily. 02/11/22  Yes Derwood Kaplan, MD  albuterol (PROVENTIL) (2.5 MG/3ML) 0.083% nebulizer solution Take 3 mLs (2.5 mg total) by nebulization every 6 (six) hours as needed for wheezing or shortness of breath. 12/16/21   Charlott Holler, MD  albuterol (VENTOLIN HFA) 108 (90 Base) MCG/ACT inhaler Inhale 2 puffs into the lungs every 6 (six) hours as needed for wheezing or shortness of breath. 12/16/21   Charlott Holler, MD  atorvastatin (LIPITOR) 40 MG tablet Take 1 tablet (40 mg total) by mouth daily. 01/19/22 01/19/23  Tysinger, Kermit Balo, PA-C  budesonide-formoterol (SYMBICORT) 160-4.5 MCG/ACT inhaler INHALE 2 PUFFS INTO THE LUNGS TWO TIMES A DAY AS NEEDED FOR SHORTNESS OF BREATH/WHEEZING 01/19/22   Tysinger, Kermit Balo, PA-C  Calcium-Magnesium-Zinc (CAL-MAG-ZINC PO) Take 1 tablet by mouth daily.    [provider]  Cholecalciferol (VITAMIN D) 50 MCG (2000 UT) CAPS Take 1 capsule (2,000 Units total) by mouth daily. 01/19/22   Tysinger, Kermit Balo, PA-C  clonazePAM (KLONOPIN) 0.5 MG tablet Take 0.5 mg by mouth 2 (two) times daily as needed for anxiety.    [provider]  Cyanocobalamin  (VITAMIN B-12 PO) Take 1 tablet by mouth daily.    [provider]  diclofenac (VOLTAREN) 75 MG EC tablet Take 1 tablet (75 mg total) by mouth 2 (two) times daily as needed. 01/11/22   Rice, Jamesetta Orleans, MD  ELDERBERRY PO Take by mouth.    [provider]  estradiol (ESTRACE) 0.5 MG tablet Take 1 tablet (0.5 mg total) by mouth daily. 07/15/21 07/15/22  Marny Lowenstein, PA-C  ferrous sulfate 325 (65 FE) MG tablet Take 325 mg by mouth daily.    [provider]  lamoTRIgine (LAMICTAL) 25 MG tablet Take 75 mg by mouth daily.    [provider]  lisinopril-hydrochlorothiazide (ZESTORETIC) 20-12.5 MG tablet Take 1 tablet by mouth daily. 01/19/22   Tysinger, Kermit Balo, PA-C  pantoprazole (PROTONIX) 40 MG tablet Take 1 tablet (40 mg total) by mouth daily. 01/19/22   Tysinger, Kermit Balo, PA-C  progesterone (PROMETRIUM) 100 MG capsule Take 1 capsule (100 mg total) by mouth at bedtime. 01/28/21   Marny Lowenstein, PA-C  sulfaSALAzine (AZULFIDINE) 500 MG tablet TAKE 2 TABLETS BY MOUTH TWICE A DAY 02/06/22   Rice, Jamesetta Orleans, MD      Allergies    Patient has no known allergies.    Review of Systems   Review of Systems  Physical Exam Updated Vital Signs BP (!) 144/80   Pulse 77   Temp 97.8 F (36.6 C) (Oral)   Resp 16   LMP 05/16/2019   SpO2 95%  Physical Exam  ED Results / Procedures / Treatments   Labs (all labs ordered are listed, but only abnormal results are displayed) Labs Reviewed - No data to display  EKG None  Radiology No results found.  Procedures Procedures  {Document cardiac monitor, telemetry assessment procedure when appropriate:1}  Medications Ordered in ED Medications  lidocaine-EPINEPHrine (XYLOCAINE W/EPI) 2 %-1:200000 (PF) injection 10 mL (has no administration in time range)  amoxicillin-clavulanate (AUGMENTIN) 875-125 MG per tablet 1 tablet (1 tablet Oral Given 02/10/22 2143)  oxyCODONE-acetaminophen (PERCOCET/ROXICET) 5-325 MG per  tablet 1 tablet (1 tablet Oral Given 02/10/22 2143)  doxycycline (VIBRA-TABS) tablet 100 mg (100 mg Oral Given 02/11/22 0009)  metroNIDAZOLE (FLAGYL) tablet 500 mg (500 mg Oral Given 02/11/22 0009)    ED Course/ Medical Decision Making/ A&P                           Medical Decision Making Risk OTC drugs. Prescription drug management.   ***  {Document critical care time when appropriate:1} {Document review of labs and clinical decision tools ie heart score, Chads2Vasc2 etc:1}  {Document your independent review of radiology images, and any outside records:1} {Document your discussion with family members, caretakers, and with consultants:1} {Document social determinants of health affecting pt's care:1} {Document your decision making why or why not admission, treatments were needed:1} Final Clinical Impression(s) / ED Diagnoses Final diagnoses:  Dog bite of face, initial encounter    Rx / DC Orders ED Discharge Orders          Ordered    doxycycline (VIBRAMYCIN) 100 MG capsule  2 times daily        02/11/22 0007    metroNIDAZOLE (FLAGYL) 500 MG tablet  2 times daily        02/11/22 0007    bacitracin ointment  2 times daily        02/11/22 0009

## 2022-02-13 ENCOUNTER — Telehealth: Payer: Self-pay

## 2022-02-13 NOTE — Telephone Encounter (Signed)
Transition Care Management Follow-up Telephone Call Date of discharge and from where: [facility] 02/11/22 How have you been since you were released from the hospital? [name] Any questions or concerns? No  Items Reviewed: Did the pt receive and understand the discharge instructions provided? Yes  Medications obtained and verified? Yes  Other? No  Any new allergies since your discharge? No  Dietary orders reviewed? Yes Do you have support at home? Yes   Home Care and Equipment/Supplies: Were home health services ordered? no   Follow up appointments reviewed:  PCP Hospital f/u appt confirmed? Yes  Scheduled to see [name] on 02/16/22 @ 12. [facility] f/u appt confirmed? No  Are transportation arrangements needed? No  If their condition worsens, is the pt aware to call PCP or go to the Emergency Dept.? Yes Was the patient provided with contact information for the PCP's office or ED? Yes Was to pt encouraged to call back with questions or concerns? Yes

## 2022-02-13 NOTE — Telephone Encounter (Signed)
I called pt. Back to see if she had been contacted by plastic surgery or had called them to f/u. Pt. Stated she is not going to f/u with plastic surgery. She said she only got 3 stitches and does not feel like she needs to f/u with plastic surgery at all.

## 2022-02-14 NOTE — Telephone Encounter (Signed)
Tried to call pt but just rang and rang. Pt has upcoming appt this week

## 2022-02-16 ENCOUNTER — Ambulatory Visit (INDEPENDENT_AMBULATORY_CARE_PROVIDER_SITE_OTHER): Payer: Commercial Managed Care - HMO | Admitting: Medical

## 2022-02-16 DIAGNOSIS — W540XXD Bitten by dog, subsequent encounter: Secondary | ICD-10-CM

## 2022-02-16 DIAGNOSIS — S0181XA Laceration without foreign body of other part of head, initial encounter: Secondary | ICD-10-CM | POA: Diagnosis not present

## 2022-02-16 DIAGNOSIS — Z4802 Encounter for removal of sutures: Secondary | ICD-10-CM | POA: Diagnosis not present

## 2022-02-16 DIAGNOSIS — U071 COVID-19: Secondary | ICD-10-CM | POA: Diagnosis not present

## 2022-02-16 NOTE — Progress Notes (Signed)
Subjective:  Kristi Decker is a 46 y.o. female who presents for Chief Complaint  Patient presents with   Wound Check     Here for wound check  I saw her on February 10, 2022 for dog bite to the face.  She was here that day to discuss sleep study but she ended up having a gallbladder right before she got here.  We subsequently had her notes emergency department given the facial laceration.  Early hours in the morning she had suture repair and is here for suture removal.  She also happened to come down with COVID since she got it sitting in the emergency department waiting room at the hospital.  She has had COVID before it was worse last time.  So far she has had about 2 or 3 days of milder flulike symptoms with body aches and chills.  She has history of asthma but not currently having a lot of shortness of breath or other symptoms.  She is taking her antibiotic for dog bite. No other aggravating or relieving factors.    No other c/o.  The following portions of the patient's history were reviewed and updated as appropriate: allergies, current medications, past family history, past medical history, past social history, past surgical history and problem list.  ROS Otherwise as in subjective above  Objective: LMP 05/16/2019   General appearance: alert, no distress, well developed, well nourished No obvious wheezing or shortness of breath Her left face laceration 2 cm long along the nasal flare and infra orbital region is healing appropriately and looks really good, there are 3 simple interrupted Prolene sutures in place.  She does have some bruising yellowish-purple bruising about the left orbit.    Assessment: Encounter Diagnoses  Name Primary?   Visit for suture removal Yes   Dog bite, subsequent encounter    COVID-19 virus infection      Plan: I saw her outside in the parking today given new onset COVID.  Cleaned and prepped the left face and remove the 3 simple interrupted  sutures.  Tolerated procedure well.  Wound is healing nicely.  Finish out antibiotic.  We discussed vitamin E cream for scar prevention after about 5 more days of letting this heal  We discussed her COVID diagnosis, advised hydration, rest, over-the-counter NSAIDs for aches and pains and fever, she is going to use the emergenC plus vitamin pack.  She knows to use her inhaler if needed.  She does not want to use Paxlovid as she had a hard time with this medicine last year when she had COVID before.  Advised call or recheck if worse or not improving in the next 5 to 7 days.  Molina was seen today for wound check.  Diagnoses and all orders for this visit:  Visit for suture removal  Dog bite, subsequent encounter  COVID-19 virus infection    Follow up: prn

## 2022-02-20 ENCOUNTER — Encounter: Payer: Self-pay | Admitting: Internal Medicine

## 2022-02-20 ENCOUNTER — Ambulatory Visit (INDEPENDENT_AMBULATORY_CARE_PROVIDER_SITE_OTHER): Payer: Commercial Managed Care - HMO | Admitting: Internal Medicine

## 2022-02-20 VITALS — BP 100/58 | HR 95 | Temp 98.2°F | Ht 65.0 in | Wt 211.6 lb

## 2022-02-20 DIAGNOSIS — G4733 Obstructive sleep apnea (adult) (pediatric): Secondary | ICD-10-CM

## 2022-02-20 DIAGNOSIS — J4520 Mild intermittent asthma, uncomplicated: Secondary | ICD-10-CM

## 2022-02-20 DIAGNOSIS — J301 Allergic rhinitis due to pollen: Secondary | ICD-10-CM | POA: Diagnosis not present

## 2022-02-20 DIAGNOSIS — J441 Chronic obstructive pulmonary disease with (acute) exacerbation: Secondary | ICD-10-CM

## 2022-02-20 MED ORDER — ALBUTEROL SULFATE HFA 108 (90 BASE) MCG/ACT IN AERS: 2.0000 | INHALATION_SPRAY | Freq: Four times a day (QID) | RESPIRATORY_TRACT | 5 refills | Status: AC | PRN

## 2022-02-20 MED ORDER — BUDESONIDE-FORMOTEROL FUMARATE 160-4.5 MCG/ACT IN AERO: INHALATION_SPRAY | RESPIRATORY_TRACT | 5 refills | Status: AC

## 2022-02-20 NOTE — Patient Instructions (Addendum)
Come see me as needed. Good luck with your move!  Continue symbicort 2 puffs twice a day as needed.  Continue albuterol as needed.

## 2022-02-20 NOTE — Progress Notes (Signed)
Kristi Decker    [id number]    November 12, 1975  Primary Care Physician:Tysinger, Camelia Eng, PA-C Date of Appointment: 02/20/2022 Established Patient Visit  Chief complaint:   Chief Complaint  Patient presents with   Follow-up    COVID dx last week.  Asthma doing well.     HPI: Kristi Decker is a 46 y.o. woman with asthma.   Interval Updates: She had covid again last week and felt she might have picked it up at the hospital. She was in ED for a dog bite to the face.  Was mild infection - no worsening of respiratory symptoms   Had home sleep study. She is due to have a  Current Regimen: albuterol prn, symbicort 2 puffs twice a day Asthma Triggers:URIs Exacerbations in the last year: once in June 2023 with covid History of hospitalization or intubation: Allergy Testing: yes allergic to dust mites GERD: yes on protonix Allergic Rhinitis: well controlled off therapy for now ACT:  Asthma Control Test ACT Total Score  02/20/2022  2:09 PM 20   FeNO: 11 ppb in August 2023  I have reviewed the patient's family social and past medical history and updated as appropriate.   Past Medical History:  Diagnosis Date   Allergy    Anemia    on iron- past hx   Anxiety    Arthritis    left knee   Asthma    Depression    GERD (gastroesophageal reflux disease)    High cholesterol    Per patient   Hypertension    Neuromuscular disorder ([place])    right arm   PTSD (post-traumatic stress disorder)    Per patient   Refusal of blood product     Past Surgical History:  Procedure Laterality Date   COLONOSCOPY  09/2021   Dr. Thornton [place]   DILATION AND CURETTAGE OF UTERUS     2002   TOTAL KNEE ARTHROPLASTY Left 08/17/2020   Procedure: LEFT TOTAL KNEE ARTHROPLASTY;  Surgeon: Meredith Pel, MD;  Location: [facility];  Service: Orthopedics;  Laterality: Left;   UPPER GASTROINTESTINAL ENDOSCOPY     UPPER GI ENDOSCOPY     WISDOM TOOTH EXTRACTION      Family History   Problem Relation Age of Onset   Rheum arthritis Mother    Hypertension Mother    Depression Mother    Anxiety disorder Mother    Bipolar disorder Father    Mental illness Sister    Depression Sister    Congestive Heart Failure Maternal Grandfather    Congestive Heart Failure Maternal Uncle    Congestive Heart Failure Maternal Uncle    Colon cancer Neg Hx    Colon polyps Neg Hx    Esophageal cancer Neg Hx    Rectal cancer Neg Hx    Stomach cancer Neg Hx     Social History   Occupational History   Not on file  Tobacco Use   Smoking status: Never    Passive exposure: Never   Smokeless tobacco: Never  Vaping Use   Vaping Use: Never used  Substance and Sexual Activity   Alcohol use: Yes    Comment: Occasionally   Drug use: Never   Sexual activity: Not on file     Physical Exam: Blood pressure (!) 100/58, pulse 95, temperature 98.2 F (36.8 C), temperature source Oral, height 5\' 5"  (1.651 m), weight 211 lb 9.6 oz (96 kg), last menstrual period 05/16/2019, SpO2 96 %.  Gen:      No acute distress ENT:  no nasal polyps, mucus membranes moist Lungs:    No increased respiratory effort, symmetric chest wall excursion, clear to auscultation bilaterally, no wheezes or crackles CV:         Regular rate and rhythm; no murmurs, rubs, or gallops.  No pedal edema   Data Reviewed: Imaging: I have personally reviewed the   PFTs:      No data to display         I have personally reviewed the patient's PFTs and srpiometry July 2023 - no airflow limitation  Labs:  Immunization status: Immunization History  Administered Date(s) Administered   Influenza,inj,Quad PF,6+ Mos 02/24/2016, 02/08/2017, 02/22/2018, 02/14/2019, 02/04/2020, 01/17/2022   Influenza-Unspecified 04/21/2021   PFIZER(Purple Top)SARS-COV-2 Vaccination 08/24/2019, 09/14/2019   Pfizer Covid-19 Vaccine Bivalent Booster 33yrs & up 04/26/2020, 11/11/2020   Pneumococcal Polysaccharide-23 02/04/2020   Tdap  11/28/2018    External Records Personally Reviewed: ED visit  Assessment:  Moderate Persistent Asthma, improved control Allergic rhinitis, well controlled Recent covid infection, mild OSA on CPAP - moderate AHI 13.6  Plan/Recommendations: Continue symbicort 2 puffs twice a day. Continue albuterol as needed [place] 6 month refills given on meds. I am happy to see her back as needed.   Return to Care: Return if symptoms worsen or fail to improve.   Durel Salts, MD Pulmonary and Critical Care Medicine [facility] Office:[phone]

## 2022-02-21 ENCOUNTER — Encounter: Payer: Self-pay | Admitting: Internal Medicine

## 2022-02-22 ENCOUNTER — Other Ambulatory Visit (HOSPITAL_BASED_OUTPATIENT_CLINIC_OR_DEPARTMENT_OTHER): Payer: Self-pay | Admitting: Medical

## 2022-02-22 ENCOUNTER — Ambulatory Visit: Payer: Commercial Managed Care - HMO | Admitting: Medical

## 2022-02-22 DIAGNOSIS — N951 Menopausal and female climacteric states: Secondary | ICD-10-CM

## 2022-02-27 ENCOUNTER — Telehealth: Payer: Commercial Managed Care - HMO | Admitting: Family

## 2022-02-27 DIAGNOSIS — J019 Acute sinusitis, unspecified: Secondary | ICD-10-CM

## 2022-02-27 MED ORDER — FLUCONAZOLE 150 MG PO TABS: 150.0000 mg | ORAL_TABLET | ORAL | 0 refills | Status: DC | PRN

## 2022-02-27 MED ORDER — AMOXICILLIN-POT CLAVULANATE 875-125 MG PO TABS: 1.0000 | ORAL_TABLET | Freq: Two times a day (BID) | ORAL | 0 refills | Status: DC

## 2022-02-27 NOTE — Progress Notes (Addendum)
Virtual Visit Consent   Kristi Decker, you are scheduled for a virtual visit with a [facility] provider today. Just as with appointments in the office, your consent must be obtained to participate. Your consent will be active for this visit and any virtual visit you may have with one of our providers in the next 365 days. If you have a MyChart account, a copy of this consent can be sent to you electronically.  As this is a virtual visit, video technology does not allow for your provider to perform a traditional examination. This may limit your provider's ability to fully assess your condition. If your provider identifies any concerns that need to be evaluated in person or the need to arrange testing (such as labs, EKG, etc.), we will make arrangements to do so. Although advances in technology are sophisticated, we cannot ensure that it will always work on either your end or our end. If the connection with a video visit is poor, the visit may have to be switched to a telephone visit. With either a video or telephone visit, we are not always able to ensure that we have a secure connection.  By engaging in this virtual visit, you consent to the provision of healthcare and authorize for your insurance to be billed (if applicable) for the services provided during this visit. Depending on your insurance coverage, you may receive a charge related to this service.  I need to obtain your verbal consent now. Are you willing to proceed with your visit today? Kristi Decker has provided verbal consent on 02/27/2022 for a virtual visit (video or telephone). Kristi Rodney, FNP  Date: 02/27/2022 6:49 PM  Virtual Visit via Video Note   I, Kristi Decker, connected with  Kristi Decker  ([id number], 05-Dec-1975) on 02/27/22 at  6:45 PM EDT by a video-enabled telemedicine application and verified that I am speaking with the correct person using two identifiers.  Location: Patient: Virtual Visit Location Patient:  Home Provider: Virtual Visit Location Provider: Home Office   I discussed the limitations of evaluation and management by telemedicine and the availability of in person appointments. The patient expressed understanding and agreed to proceed.    History of Present Illness: Kristi Decker is a 46 y.o. who identifies as a female who was assigned female at birth, and is being seen today for sinus issue that .  HPI: Sinus Problem This is a new problem. The current episode started in the past 7 days. The problem has been gradually worsening since onset. There has been no fever. Her pain is at a severity of 7/10. The pain is mild. Associated symptoms include chills, coughing, headaches, a hoarse voice, sinus pressure, sneezing and swollen glands. Pertinent negatives include no congestion, ear pain, shortness of breath or sore throat. Past treatments include oral decongestants. The treatment provided mild relief.    Problems:  Patient Active Problem List   Diagnosis Date Noted   Mixed hyperlipidemia 11/14/2021   COVID-19 virus infection 11/07/2021   Hypoxemia 11/07/2021   Acute hypoxemic respiratory failure (HCC) 11/07/2021   Hypokalemia 11/07/2021   Low back pain 10/04/2021   Environmental and seasonal allergies 08/26/2021   Seasonal allergic rhinitis 08/26/2021   High risk medication use 07/05/2021   Moderate persistent asthma without complication 09/17/2020   S/P total knee arthroplasty, left 08/17/2020   Inflammatory arthritis 10/10/2019   Perimenopausal disorder 11/28/2018   GAD (generalized anxiety disorder) 06/28/2018   Primary osteoarthritis of left knee 04/17/2018   Prediabetes 12/03/2017  Irritable bowel 03/19/2017   GERD without esophagitis 03/19/2017   Fibromyalgia 11/28/2016   Migraine without aura and without status migrainosus, not intractable 10/17/2016   Essential hypertension 10/17/2016   Anxiety and depression 03/28/2016    Allergies:  Allergies  Allergen Reactions    Paxlovid [Nirmatrelvir-Ritonavir]     intolerance   Medications:  Current Outpatient Medications:    amoxicillin-clavulanate (AUGMENTIN) 875-125 MG tablet, Take 1 tablet by mouth 2 (two) times daily., Disp: 14 tablet, Rfl: 0   fluconazole (DIFLUCAN) 150 MG tablet, Take 1 tablet (150 mg total) by mouth every three (3) days as needed., Disp: 3 tablet, Rfl: 0   albuterol (PROVENTIL) (2.5 MG/3ML) 0.083% nebulizer solution, Take 3 mLs (2.5 mg total) by nebulization every 6 (six) hours as needed for wheezing or shortness of breath., Disp: 75 mL, Rfl: 5   albuterol (VENTOLIN HFA) 108 (90 Base) MCG/ACT inhaler, Inhale 2 puffs into the lungs every 6 (six) hours as needed for wheezing or shortness of breath., Disp: 18 g, Rfl: 5   atorvastatin (LIPITOR) 40 MG tablet, Take 1 tablet (40 mg total) by mouth daily., Disp: 30 tablet, Rfl: 11   budesonide-formoterol (SYMBICORT) 160-4.5 MCG/ACT inhaler, INHALE 2 PUFFS INTO THE LUNGS TWO TIMES A DAY AS NEEDED FOR SHORTNESS OF BREATH/WHEEZING, Disp: 10.2 g, Rfl: 5   Calcium-Magnesium-Zinc (CAL-MAG-ZINC PO), Take 1 tablet by mouth daily., Disp: , Rfl:    Cholecalciferol (VITAMIN D) 50 MCG (2000 UT) CAPS, Take 1 capsule (2,000 Units total) by mouth daily., Disp: 90 capsule, Rfl: 3   clonazePAM (KLONOPIN) 0.5 MG tablet, Take 0.5 mg by mouth 2 (two) times daily as needed for anxiety., Disp: , Rfl:    Cyanocobalamin (VITAMIN B-12 PO), Take 1 tablet by mouth daily., Disp: , Rfl:    diclofenac (VOLTAREN) 75 MG EC tablet, Take 1 tablet (75 mg total) by mouth 2 (two) times daily as needed., Disp: 60 tablet, Rfl: 2   ELDERBERRY PO, Take by mouth., Disp: , Rfl:    estradiol (ESTRACE) 0.5 MG tablet, Take 1 tablet (0.5 mg total) by mouth daily., Disp: 30 tablet, Rfl: 11   ferrous sulfate 325 (65 FE) MG tablet, Take 325 mg by mouth daily., Disp: , Rfl:    lamoTRIgine (LAMICTAL) 25 MG tablet, Take 75 mg by mouth daily., Disp: , Rfl:    lisinopril-hydrochlorothiazide (ZESTORETIC)  20-12.5 MG tablet, Take 1 tablet by mouth daily., Disp: 90 tablet, Rfl: 3   pantoprazole (PROTONIX) 40 MG tablet, Take 1 tablet (40 mg total) by mouth daily., Disp: 90 tablet, Rfl: 3   progesterone (PROMETRIUM) 100 MG capsule, Take 1 capsule (100 mg total) by mouth at bedtime., Disp: 30 capsule, Rfl: 12   sulfaSALAzine (AZULFIDINE) 500 MG tablet, TAKE 2 TABLETS BY MOUTH TWICE A DAY, Disp: 360 tablet, Rfl: 1  Observations/Objective: Patient is well-developed, well-nourished in no acute distress.  Resting comfortably  at home in bed.  Head is normocephalic, atraumatic.  No labored breathing.  Speech is clear and coherent with logical content.  Patient is alert and oriented at baseline.  Nasal congestion   Assessment and Plan: 1. Acute sinusitis, recurrence not specified, unspecified location - amoxicillin-clavulanate (AUGMENTIN) 875-125 MG tablet; Take 1 tablet by mouth 2 (two) times daily.  Dispense: 14 tablet; Refill: 0  - Take meds as prescribed - Use a cool mist humidifier  -Use saline nose sprays frequently -Force fluids -For any cough or congestion  Use plain Mucinex- regular strength or max strength is fine -For  fever or aces or pains- take tylenol or ibuprofen. -Throat lozenges if help -Follow up if symptoms worsen or do not improve     -append: Pt messaged stating she can not tolerate Augmentin- Will change to doxy 100 mg twice daily for 10 days  Follow Up Instructions: I discussed the assessment and treatment plan with the patient. The patient was provided an opportunity to ask questions and all were answered. The patient agreed with the plan and demonstrated an understanding of the instructions.  A copy of instructions were sent to the patient via MyChart unless otherwise noted below.     The patient was advised to call back or seek an in-person evaluation if the symptoms worsen or if the condition fails to improve as anticipated.  Time:  I spent 11 minutes with the  patient via telehealth technology discussing the above problems/concerns.    Kristi Rodney, FNP

## 2022-02-28 ENCOUNTER — Telehealth: Payer: Commercial Managed Care - HMO | Admitting: Emergency Medicine

## 2022-02-28 ENCOUNTER — Encounter: Payer: Self-pay | Admitting: Emergency Medicine

## 2022-02-28 ENCOUNTER — Telehealth: Payer: Commercial Managed Care - HMO | Admitting: Family Medicine

## 2022-02-28 ENCOUNTER — Telehealth: Payer: Commercial Managed Care - HMO

## 2022-02-28 DIAGNOSIS — J019 Acute sinusitis, unspecified: Secondary | ICD-10-CM

## 2022-02-28 DIAGNOSIS — Z79899 Other long term (current) drug therapy: Secondary | ICD-10-CM

## 2022-02-28 MED ORDER — DOXYCYCLINE HYCLATE 100 MG PO TABS: 100.0000 mg | ORAL_TABLET | Freq: Two times a day (BID) | ORAL | 0 refills | Status: DC

## 2022-02-28 NOTE — Progress Notes (Signed)
Needs med changed

## 2022-02-28 NOTE — Progress Notes (Signed)
[place]   Seen last night by our group- needs med change due to intolerance with gi upset as symptom. See note from last night for Append for med change  Patient acknowledged agreement and understanding of the plan.

## 2022-02-28 NOTE — Telephone Encounter (Signed)
Patient wasinfromed to reply to original evisit for different medication

## 2022-02-28 NOTE — Addendum Note (Signed)
Addended by: Perlie Mayo on: 02/28/2022 02:32 PM   Modules accepted: Orders

## 2022-03-04 ENCOUNTER — Telehealth: Payer: Commercial Managed Care - HMO | Admitting: Physician Assistant

## 2022-03-04 DIAGNOSIS — J4541 Moderate persistent asthma with (acute) exacerbation: Secondary | ICD-10-CM

## 2022-03-04 DIAGNOSIS — B9689 Other specified bacterial agents as the cause of diseases classified elsewhere: Secondary | ICD-10-CM

## 2022-03-04 DIAGNOSIS — J019 Acute sinusitis, unspecified: Secondary | ICD-10-CM

## 2022-03-04 MED ORDER — BENZONATATE 100 MG PO CAPS: 100.0000 mg | ORAL_CAPSULE | Freq: Three times a day (TID) | ORAL | 0 refills | Status: DC | PRN

## 2022-03-04 MED ORDER — AZITHROMYCIN 250 MG PO TABS: ORAL_TABLET | ORAL | 0 refills | Status: AC

## 2022-03-04 MED ORDER — PREDNISONE 20 MG PO TABS: 40.0000 mg | ORAL_TABLET | Freq: Every day | ORAL | 0 refills | Status: DC

## 2022-03-04 NOTE — Progress Notes (Signed)
Virtual Visit Consent   Kristi Decker, you are scheduled for a virtual visit with a [facility] provider today. Just as with appointments in the office, your consent must be obtained to participate. Your consent will be active for this visit and any virtual visit you may have with one of our providers in the next 365 days. If you have a MyChart account, a copy of this consent can be sent to you electronically.  As this is a virtual visit, video technology does not allow for your provider to perform a traditional examination. This may limit your provider's ability to fully assess your condition. If your provider identifies any concerns that need to be evaluated in person or the need to arrange testing (such as labs, EKG, etc.), we will make arrangements to do so. Although advances in technology are sophisticated, we cannot ensure that it will always work on either your end or our end. If the connection with a video visit is poor, the visit may have to be switched to a telephone visit. With either a video or telephone visit, we are not always able to ensure that we have a secure connection.  By engaging in this virtual visit, you consent to the provision of healthcare and authorize for your insurance to be billed (if applicable) for the services provided during this visit. Depending on your insurance coverage, you may receive a charge related to this service.  I need to obtain your verbal consent now. Are you willing to proceed with your visit today? Kristi Decker has provided verbal consent on 03/04/2022 for a virtual visit (video or telephone). Kristi Decker, [place]  Date: 03/04/2022 9:09 AM  Virtual Visit via Video Note   I, Kristi Decker, connected with  Kristi Decker  ([id number], [birth year]) on 03/04/22 at  9:00 AM EDT by a video-enabled telemedicine application and verified that I am speaking with the correct person using two identifiers.  Location: Patient: Virtual Visit Location  Patient: Home Provider: Virtual Visit Location Provider: Home Office   I discussed the limitations of evaluation and management by telemedicine and the availability of in person appointments. The patient expressed understanding and agreed to proceed.    History of Present Illness: Kristi Decker is a 46 y.o. who identifies as a female who was assigned female at birth, and is being seen today for continued and progressive sinus symptoms despite treatment with Doxycycline. Was initially seen on 10/16 and started on Augmentin for suspected sinusitis. She never took this as she remembered it causes significant GI distress so she reached out to provider and was switched to Doxycycline which she has been taking as directed, tolerating well, but without improvement in symptoms. Is noting more of an effect on lungs now including chest congestion, increased cough and wheezing requiring more use of her rescue inhaler. Just recovered from [place] last month. Denies any exposure to the flu. Continues to have thick nasal drainage, sinus pressure and sinus pain. Denies fever, chest pain.  HPI: HPI  Problems:  Patient Active Problem List   Diagnosis Date Noted   Mixed hyperlipidemia 11/14/2021   COVID-19 virus infection 11/07/2021   Hypoxemia 11/07/2021   Acute hypoxemic respiratory failure ([facility]) 11/07/2021   Hypokalemia 11/07/2021   Low back pain 10/04/2021   Environmental and seasonal allergies 08/26/2021   Seasonal allergic rhinitis 08/26/2021   High risk medication use 07/05/2021   Moderate persistent asthma without complication 89/38/1017   S/P total knee arthroplasty, left 08/17/2020   Inflammatory arthritis  10/10/2019   Perimenopausal disorder 11/28/2018   GAD (generalized anxiety disorder) 06/28/2018   Primary osteoarthritis of left knee 04/17/2018   Prediabetes 12/03/2017   Irritable bowel 03/19/2017   GERD without esophagitis 03/19/2017   Fibromyalgia 11/28/2016   Migraine without aura and  without status migrainosus, not intractable 10/17/2016   Essential hypertension 10/17/2016   Anxiety and depression 03/28/2016    Allergies:  Allergies  Allergen Reactions   Paxlovid [Nirmatrelvir-Ritonavir]     intolerance   Medications:  Current Outpatient Medications:    azithromycin (ZITHROMAX) 250 MG tablet, Take 2 tablets on day 1, then 1 tablet daily on days 2 through 5, Disp: 6 tablet, Rfl: 0   benzonatate (TESSALON) 100 MG capsule, Take 1 capsule (100 mg total) by mouth 3 (three) times daily as needed for cough., Disp: 30 capsule, Rfl: 0   predniSONE (DELTASONE) 20 MG tablet, Take 2 tablets (40 mg total) by mouth daily with breakfast., Disp: 10 tablet, Rfl: 0   albuterol (PROVENTIL) (2.5 MG/3ML) 0.083% nebulizer solution, Take 3 mLs (2.5 mg total) by nebulization every 6 (six) hours as needed for wheezing or shortness of breath., Disp: 75 mL, Rfl: 5   albuterol (VENTOLIN HFA) 108 (90 Base) MCG/ACT inhaler, Inhale 2 puffs into the lungs every 6 (six) hours as needed for wheezing or shortness of breath., Disp: 18 g, Rfl: 5   atorvastatin (LIPITOR) 40 MG tablet, Take 1 tablet (40 mg total) by mouth daily., Disp: 30 tablet, Rfl: 11   budesonide-formoterol (SYMBICORT) 160-4.5 MCG/ACT inhaler, INHALE 2 PUFFS INTO THE LUNGS TWO TIMES A DAY AS NEEDED FOR SHORTNESS OF BREATH/WHEEZING, Disp: 10.2 g, Rfl: 5   Calcium-Magnesium-Zinc (CAL-MAG-ZINC PO), Take 1 tablet by mouth daily., Disp: , Rfl:    Cholecalciferol (VITAMIN D) 50 MCG (2000 UT) CAPS, Take 1 capsule (2,000 Units total) by mouth daily., Disp: 90 capsule, Rfl: 3   clonazePAM (KLONOPIN) 0.5 MG tablet, Take 0.5 mg by mouth 2 (two) times daily as needed for anxiety., Disp: , Rfl:    Cyanocobalamin (VITAMIN B-12 PO), Take 1 tablet by mouth daily., Disp: , Rfl:    diclofenac (VOLTAREN) 75 MG EC tablet, Take 1 tablet (75 mg total) by mouth 2 (two) times daily as needed., Disp: 60 tablet, Rfl: 2   ELDERBERRY PO, Take by mouth., Disp: , Rfl:     estradiol (ESTRACE) 0.5 MG tablet, Take 1 tablet (0.5 mg total) by mouth daily., Disp: 30 tablet, Rfl: 11   ferrous sulfate 325 (65 FE) MG tablet, Take 325 mg by mouth daily., Disp: , Rfl:    fluconazole (DIFLUCAN) 150 MG tablet, Take 1 tablet (150 mg total) by mouth every three (3) days as needed., Disp: 3 tablet, Rfl: 0   lamoTRIgine (LAMICTAL) 25 MG tablet, Take 75 mg by mouth daily., Disp: , Rfl:    lisinopril-hydrochlorothiazide (ZESTORETIC) 20-12.5 MG tablet, Take 1 tablet by mouth daily., Disp: 90 tablet, Rfl: 3   pantoprazole (PROTONIX) 40 MG tablet, Take 1 tablet (40 mg total) by mouth daily., Disp: 90 tablet, Rfl: 3   progesterone (PROMETRIUM) 100 MG capsule, Take 1 capsule (100 mg total) by mouth at bedtime., Disp: 30 capsule, Rfl: 12   sulfaSALAzine (AZULFIDINE) 500 MG tablet, TAKE 2 TABLETS BY MOUTH TWICE A DAY, Disp: 360 tablet, Rfl: 1  Observations/Objective: Patient is well-developed, well-nourished in no acute distress.  Resting comfortably at home.  Head is normocephalic, atraumatic.  No labored breathing. Speech is clear and coherent with logical content.  Patient  is alert and oriented at baseline.   Assessment and Plan: 1. Acute bacterial sinusitis - azithromycin (ZITHROMAX) 250 MG tablet; Take 2 tablets on day 1, then 1 tablet daily on days 2 through 5  Dispense: 6 tablet; Refill: 0 - benzonatate (TESSALON) 100 MG capsule; Take 1 capsule (100 mg total) by mouth 3 (three) times daily as needed for cough.  Dispense: 30 capsule; Refill: 0  2. Moderate persistent asthma with exacerbation - azithromycin (ZITHROMAX) 250 MG tablet; Take 2 tablets on day 1, then 1 tablet daily on days 2 through 5  Dispense: 6 tablet; Refill: 0 - predniSONE (DELTASONE) 20 MG tablet; Take 2 tablets (40 mg total) by mouth daily with breakfast.  Dispense: 10 tablet; Refill: 0  Will stop Doxycycline. Start Azithromycin as she notes this is always what works for her. Feel reasonable giving  medication intolerances and her history. Will add on 5-day burst of prednisone for sinus inflammation and to calm down asthma exacerbation. Supportive measures, OTC medications and ER precautions reviewed.   Follow Up Instructions: I discussed the assessment and treatment plan with the patient. The patient was provided an opportunity to ask questions and all were answered. The patient agreed with the plan and demonstrated an understanding of the instructions.  A copy of instructions were sent to the patient via MyChart unless otherwise noted below.   The patient was advised to call back or seek an in-person evaluation if the symptoms worsen or if the condition fails to improve as anticipated.  Time:  I spent 10 minutes with the patient via telehealth technology discussing the above problems/concerns.    [name], PA-C

## 2022-03-04 NOTE — Patient Instructions (Signed)
Kristi Decker, thank you for joining [name], PA-C for today's virtual visit.  While this provider is not your primary care provider (PCP), if your PCP is located in our provider database this encounter information will be shared with them immediately following your visit.   A [name] account gives you access to today's visit and all your visits, tests, and labs performed at [facility] " click here if you don't have a [name] account or go to [email or web address]  Consent: (Patient) Kristi Decker provided verbal consent for this virtual visit at the beginning of the encounter.  Current Medications:  Current Outpatient Medications:    albuterol (PROVENTIL) (2.5 MG/3ML) 0.083% nebulizer solution, Take 3 mLs (2.5 mg total) by nebulization every 6 (six) hours as needed for wheezing or shortness of breath., Disp: 75 mL, Rfl: 5   albuterol (VENTOLIN HFA) 108 (90 Base) MCG/ACT inhaler, Inhale 2 puffs into the lungs every 6 (six) hours as needed for wheezing or shortness of breath., Disp: 18 g, Rfl: 5   atorvastatin (LIPITOR) 40 MG tablet, Take 1 tablet (40 mg total) by mouth daily., Disp: 30 tablet, Rfl: 11   budesonide-formoterol (SYMBICORT) 160-4.5 MCG/ACT inhaler, INHALE 2 PUFFS INTO THE LUNGS TWO TIMES A DAY AS NEEDED FOR SHORTNESS OF BREATH/WHEEZING, Disp: 10.2 g, Rfl: 5   Calcium-Magnesium-Zinc (CAL-MAG-ZINC PO), Take 1 tablet by mouth daily., Disp: , Rfl:    Cholecalciferol (VITAMIN D) 50 MCG (2000 UT) CAPS, Take 1 capsule (2,000 Units total) by mouth daily., Disp: 90 capsule, Rfl: 3   clonazePAM (KLONOPIN) 0.5 MG tablet, Take 0.5 mg by mouth 2 (two) times daily as needed for anxiety., Disp: , Rfl:    Cyanocobalamin (VITAMIN B-12 PO), Take 1 tablet by mouth daily., Disp: , Rfl:    diclofenac (VOLTAREN) 75 MG EC tablet, Take 1 tablet (75 mg total) by mouth 2 (two) times daily as needed., Disp: 60 tablet, Rfl: 2   doxycycline (VIBRA-TABS) 100  MG tablet, Take 1 tablet (100 mg total) by mouth 2 (two) times daily for 10 days., Disp: 20 tablet, Rfl: 0   ELDERBERRY PO, Take by mouth., Disp: , Rfl:    estradiol (ESTRACE) 0.5 MG tablet, Take 1 tablet (0.5 mg total) by mouth daily., Disp: 30 tablet, Rfl: 11   ferrous sulfate 325 (65 FE) MG tablet, Take 325 mg by mouth daily., Disp: , Rfl:    fluconazole (DIFLUCAN) 150 MG tablet, Take 1 tablet (150 mg total) by mouth every three (3) days as needed., Disp: 3 tablet, Rfl: 0   lamoTRIgine (LAMICTAL) 25 MG tablet, Take 75 mg by mouth daily., Disp: , Rfl:    lisinopril-hydrochlorothiazide (ZESTORETIC) 20-12.5 MG tablet, Take 1 tablet by mouth daily., Disp: 90 tablet, Rfl: 3   pantoprazole (PROTONIX) 40 MG tablet, Take 1 tablet (40 mg total) by mouth daily., Disp: 90 tablet, Rfl: 3   progesterone (PROMETRIUM) 100 MG capsule, Take 1 capsule (100 mg total) by mouth at bedtime., Disp: 30 capsule, Rfl: 12   sulfaSALAzine (AZULFIDINE) 500 MG tablet, TAKE 2 TABLETS BY MOUTH TWICE A DAY, Disp: 360 tablet, Rfl: 1   Medications ordered in this encounter:  No orders of the defined types were placed in this encounter.    *If you need refills on other medications prior to your next appointment, please contact your pharmacy*  Follow-Up: Call back or seek an in-person evaluation if the symptoms worsen or if the condition fails to improve as anticipated.  [facility] [phone]  Other Instructions Please take the new antibiotic as directed in place of the Doxycycline.  Increase fluid intake.  Use Saline nasal spray.  Take a daily multivitamin. Continue previous recommended OTC medications. Use the Tessalon and prednisone as directed.  Place a humidifier in the bedroom.  Please call or return clinic if symptoms are not improving.  Sinusitis Sinusitis is redness, soreness, and swelling (inflammation) of the paranasal sinuses. Paranasal sinuses are air pockets within the bones of your face  (beneath the eyes, the middle of the forehead, or above the eyes). In healthy paranasal sinuses, mucus is able to drain out, and air is able to circulate through them by way of your nose. However, when your paranasal sinuses are inflamed, mucus and air can become trapped. This can allow bacteria and other germs to grow and cause infection. Sinusitis can develop quickly and last only a short time (acute) or continue over a long period (chronic). Sinusitis that lasts for more than 12 weeks is considered chronic.  CAUSES  Causes of sinusitis include: Allergies. Structural abnormalities, such as displacement of the cartilage that separates your nostrils (deviated septum), which can decrease the air flow through your nose and sinuses and affect sinus drainage. Functional abnormalities, such as when the small hairs (cilia) that line your sinuses and help remove mucus do not work properly or are not present. SYMPTOMS  Symptoms of acute and chronic sinusitis are the same. The primary symptoms are pain and pressure around the affected sinuses. Other symptoms include: Upper toothache. Earache. Headache. Bad breath. Decreased sense of smell and taste. A cough, which worsens when you are lying flat. Fatigue. Fever. Thick drainage from your nose, which often is green and may contain pus (purulent). Swelling and warmth over the affected sinuses. DIAGNOSIS  Your caregiver will perform a physical exam. During the exam, your caregiver may: Look in your nose for signs of abnormal growths in your nostrils (nasal polyps). Tap over the affected sinus to check for signs of infection. View the inside of your sinuses (endoscopy) with a special imaging device with a light attached (endoscope), which is inserted into your sinuses. If your caregiver suspects that you have chronic sinusitis, one or more of the following tests may be recommended: Allergy tests. Nasal culture A sample of mucus is taken from your nose  and sent to a lab and screened for bacteria. Nasal cytology A sample of mucus is taken from your nose and examined by your caregiver to determine if your sinusitis is related to an allergy. TREATMENT  Most cases of acute sinusitis are related to a viral infection and will resolve on their own within 10 days. Sometimes medicines are prescribed to help relieve symptoms (pain medicine, decongestants, nasal steroid sprays, or saline sprays).  However, for sinusitis related to a bacterial infection, your caregiver will prescribe antibiotic medicines. These are medicines that will help kill the bacteria causing the infection.  Rarely, sinusitis is caused by a fungal infection. In theses cases, your caregiver will prescribe antifungal medicine. For some cases of chronic sinusitis, surgery is needed. Generally, these are cases in which sinusitis recurs more than 3 times per year, despite other treatments. HOME CARE INSTRUCTIONS  Drink plenty of water. Water helps thin the mucus so your sinuses can drain more easily. Use a humidifier. Inhale steam 3 to 4 times a day (for example, sit in the bathroom with the shower running). Apply a warm, moist washcloth to your face 3 to  4 times a day, or as directed by your caregiver. Use saline nasal sprays to help moisten and clean your sinuses. Take over-the-counter or prescription medicines for pain, discomfort, or fever only as directed by your caregiver. SEEK IMMEDIATE MEDICAL CARE IF: You have increasing pain or severe headaches. You have nausea, vomiting, or drowsiness. You have swelling around your face. You have vision problems. You have a stiff neck. You have difficulty breathing. MAKE SURE YOU:  Understand these instructions. Will watch your condition. Will get help right away if you are not doing well or get worse. Document Released: 05/01/2005 Document Revised: 07/24/2011 Document Reviewed: 05/16/2011 [facility] Patient Information 2014 [place],  [place].    If you have been instructed to have an in-person evaluation today at a local Urgent Care facility, please use the link below. It will take you to a list of all of our available [facility], including address, phone number and hours of operation. Please do not delay care.  [facility]  If you or a family member do not have a primary care provider, use the link below to schedule a visit and establish care. When you choose a [facility] primary care physician or advanced practice provider, you gain a long-term partner in health. Find a Primary Care Provider  Learn more about [facility]'s in-office and virtual care options: [facility]

## 2022-03-08 ENCOUNTER — Other Ambulatory Visit: Payer: Self-pay | Admitting: Medical

## 2022-03-08 ENCOUNTER — Telehealth: Payer: Self-pay

## 2022-03-08 DIAGNOSIS — N951 Menopausal and female climacteric states: Secondary | ICD-10-CM

## 2022-03-08 MED ORDER — PROGESTERONE MICRONIZED 100 MG PO CAPS: 100.0000 mg | ORAL_CAPSULE | Freq: Every day | ORAL | 0 refills | Status: DC

## 2022-03-08 MED ORDER — ESTRADIOL 0.5 MG PO TABS: 0.5000 mg | ORAL_TABLET | Freq: Every day | ORAL | 0 refills | Status: DC

## 2022-03-08 NOTE — Telephone Encounter (Signed)
Pt. Called about her husband Kristi Decker DOB [birth year]. She thought he had already been seen here but I looked in his chart and he has never been seen here before. She wanted to know if you could take him on as a new pt. And she said he needed a referral to a rheumatologist because he has a problem with pain all over. I told her I would have to ask you first if you would take him as a new pt. And he would need an apt. Here first before a referral could be put in.

## 2022-03-29 ENCOUNTER — Ambulatory Visit: Payer: Commercial Managed Care - HMO | Admitting: Orthopaedic Surgery

## 2022-04-04 ENCOUNTER — Ambulatory Visit (INDEPENDENT_AMBULATORY_CARE_PROVIDER_SITE_OTHER): Payer: Commercial Managed Care - HMO | Admitting: Orthopaedic Surgery

## 2022-04-04 ENCOUNTER — Other Ambulatory Visit: Payer: Self-pay | Admitting: Medical

## 2022-04-04 ENCOUNTER — Encounter: Payer: Self-pay | Admitting: Orthopaedic Surgery

## 2022-04-04 DIAGNOSIS — G5601 Carpal tunnel syndrome, right upper limb: Secondary | ICD-10-CM | POA: Diagnosis not present

## 2022-04-04 DIAGNOSIS — G5602 Carpal tunnel syndrome, left upper limb: Secondary | ICD-10-CM

## 2022-04-04 DIAGNOSIS — N951 Menopausal and female climacteric states: Secondary | ICD-10-CM

## 2022-04-04 MED ORDER — LIDOCAINE HCL 1 % IJ SOLN
1.0000 mL | INTRAMUSCULAR | Status: AC | PRN
  Administered 2022-04-04: 1 mL

## 2022-04-04 MED ORDER — BUPIVACAINE HCL 0.5 % IJ SOLN
1.0000 mL | INTRAMUSCULAR | Status: AC | PRN
  Administered 2022-04-04: 1 mL

## 2022-04-04 MED ORDER — METHYLPREDNISOLONE ACETATE 40 MG/ML IJ SUSP
40.0000 mg | INTRAMUSCULAR | Status: AC | PRN
  Administered 2022-04-04: 40 mg

## 2022-04-04 NOTE — Progress Notes (Signed)
Office Visit Note   Patient: Kristi Decker           Date of Birth: [birth year]           MRN: [id number] Visit Date: 04/04/2022              Requested by: Jac Canavan, PA-C [address] [place],  [place] [address] PCP: Jac Canavan, PA-C   Assessment & Plan: Visit Diagnoses:  1. Right carpal tunnel syndrome   2. Left carpal tunnel syndrome     Plan: Impression is bilateral carpal tunnel syndrome.  She has had previous nerve conduction studies on the right hand showing severe disease.  She is reporting similar symptoms in the left hand.  Based on these findings treatment options were reviewed and discussed and she would like to do bilateral carpal tunnel injections today.  She will also wear braces at nighttime.  We will see her back as needed.  Follow-Up Instructions: No follow-ups on file.   Orders:  No orders of the defined types were placed in this encounter.  No orders of the defined types were placed in this encounter.     Procedures: Hand/UE Inj: bilateral carpal tunnel for carpal tunnel syndrome on 04/04/2022 4:35 PM Indications: pain Details: 25 G needle Medications (Right): 1 mL lidocaine 1 %; 1 mL bupivacaine 0.5 %; 40 mg methylPREDNISolone acetate 40 MG/ML Medications (Left): 1 mL lidocaine 1 %; 1 mL bupivacaine 0.5 %; 40 mg methylPREDNISolone acetate 40 MG/ML Outcome: tolerated well, no immediate complications Patient was prepped and draped in the usual sterile fashion.       Clinical Data: No additional findings.   Subjective: Chief Complaint  Patient presents with   Right Hand - Numbness   Left Hand - Numbness    HPI Kristi Decker comes in today for evaluation of bilateral hand numbness and tingling and throbbing for weeks.  She had nerve conduction studies last year that showed severe right carpal tunnel syndrome.  Does not look like she had studies done on the left hand.  Review of Systems  Constitutional: Negative.   HENT:  Negative.    Eyes: Negative.   Respiratory: Negative.    Cardiovascular: Negative.   Endocrine: Negative.   Musculoskeletal: Negative.   Neurological: Negative.   Hematological: Negative.   Psychiatric/Behavioral: Negative.    All other systems reviewed and are negative.    Objective: Vital Signs: LMP 05/16/2019   Physical Exam Vitals and nursing note reviewed.  Constitutional:      Appearance: She is well-developed.  Pulmonary:     Effort: Pulmonary effort is normal.  Skin:    General: Skin is warm.     Capillary Refill: Capillary refill takes less than 2 seconds.  Neurological:     Mental Status: She is alert and oriented to person, place, and time.  Psychiatric:        Behavior: Behavior normal.        Thought Content: Thought content normal.        Judgment: Judgment normal.     Ortho Exam Examination of bilateral hands show positive carpal tunnel compressive signs. Specialty Comments:  MRI LUMBAR SPINE WITHOUT CONTRAST   TECHNIQUE: Multiplanar, multisequence MR imaging of the lumbar spine was performed. No intravenous contrast was administered.   COMPARISON:  X-ray lumbar 06/23/2021 10/11/2015.   FINDINGS: Segmentation: Standard; the lowest formed disc space is designated L5-S1.   Alignment: There is significant levoscoliosis centered at L3, grossly similar to the radiographs  from 2017. There is no antero or retrolisthesis.   Vertebrae: Background marrow signal is normal. There is degenerative endplate marrow signal abnormality at L3-L4, more so along the right side of the disc space due to the scoliotic curvature. There is no suspicious marrow signal abnormality or marrow edema.   Conus medullaris and cauda equina: Conus extends to the L1 level. Conus and cauda equina appear normal.   Paraspinal and other soft tissues: Unremarkable.   Disc levels:   There is advanced disc desiccation and narrowing at L3-L4 with associated degenerative endplate  irregularity. There is mild desiccation and narrowing at L2-L3. The other disc heights are overall preserved.   T12-L1: No significant spinal canal or neural foraminal stenosis   L1-L2: There is a mild disc bulge without significant spinal canal or neural foraminal stenosis   L2-L3: There is a diffuse disc bulge with an inferiorly migrated right subarticular zone extrusion extending approximately 1.3 cm inferiorly from the level of the superior L3 endplate, and mild bilateral facet arthropathy resulting in moderate spinal canal stenosis with crowding of the cauda equina nerve roots and impingement of the traversing right L3 nerve root, and mild right and no significant left neural foraminal stenosis   L3-L4: There is a mild disc bulge and bilateral facet arthropathy with small effusions resulting in mild spinal canal stenosis and mild bilateral neural foraminal stenosis   L4-L5: There is a broad-based left subarticular zone/foraminal disc protrusion and bilateral facet arthropathy with a small right effusion resulting in mild narrowing of the left subarticular zone without evidence of nerve root impingement, and moderate left and mild right neural foraminal stenosis   L5-S1: Mild bilateral facet arthropathy without significant spinal canal or neural foraminal stenosis.   IMPRESSION: 1. Significant levoscoliosis centered at L3, grossly similar to the radiographs from 2017. There is associated disc space narrowing and degenerative endplate change at L3-L4, more so along the right aspect of the disc space due to the scoliotic curvature. There is a mild disc bulge and bilateral facet arthropathy at this level resulting in mild spinal canal stenosis and mild bilateral neural foraminal stenosis. 2. Diffuse disc bulge with a superiorly migrated right subarticular zone extrusion at L2-L3 resulting in moderate spinal canal stenosis with crowding of the cauda equina nerve roots and  impingement of the traversing right L3 nerve root. 3. Broad-based left subarticular zone/foraminal protrusion at L3-L4 resulting in moderate left neural foraminal stenosis.     Electronically Signed   By: Lesia Hausen M.D.   On: 08/10/2021 11:10  Imaging: No results found.   PMFS History: Patient Active Problem List   Diagnosis Date Noted   Mixed hyperlipidemia 11/14/2021   COVID-19 virus infection 11/07/2021   Hypoxemia 11/07/2021   Acute hypoxemic respiratory failure (HCC) 11/07/2021   Hypokalemia 11/07/2021   Low back pain 10/04/2021   Environmental and seasonal allergies 08/26/2021   Seasonal allergic rhinitis 08/26/2021   High risk medication use 07/05/2021   Moderate persistent asthma without complication 09/17/2020   S/P total knee arthroplasty, left 08/17/2020   Inflammatory arthritis 10/10/2019   Perimenopausal disorder 11/28/2018   GAD (generalized anxiety disorder) 06/28/2018   Primary osteoarthritis of left knee 04/17/2018   Prediabetes 12/03/2017   Irritable bowel 03/19/2017   GERD without esophagitis 03/19/2017   Fibromyalgia 11/28/2016   Migraine without aura and without status migrainosus, not intractable 10/17/2016   Essential hypertension 10/17/2016   Anxiety and depression 03/28/2016   Past Medical History:  Diagnosis Date  Allergy    Anemia    on iron- past hx   Anxiety    Arthritis    left knee   Asthma    Depression    GERD (gastroesophageal reflux disease)    High cholesterol    Per patient   Hypertension    Neuromuscular disorder (HCC)    right arm   PTSD (post-traumatic stress disorder)    Per patient   Refusal of blood product     Family History  Problem Relation Age of Onset   Rheum arthritis Mother    Hypertension Mother    Depression Mother    Anxiety disorder Mother    Bipolar disorder Father    Mental illness Sister    Depression Sister    Congestive Heart Failure Maternal Grandfather    Congestive Heart Failure  Maternal Uncle    Congestive Heart Failure Maternal Uncle    Colon cancer Neg Hx    Colon polyps Neg Hx    Esophageal cancer Neg Hx    Rectal cancer Neg Hx    Stomach cancer Neg Hx     Past Surgical History:  Procedure Laterality Date   COLONOSCOPY  09/2021   Dr. Tressia Danas   DILATION AND CURETTAGE OF UTERUS     2002   TOTAL KNEE ARTHROPLASTY Left 08/17/2020   Procedure: LEFT TOTAL KNEE ARTHROPLASTY;  Surgeon: Cammy Copa, MD;  Location: [facility] OR;  Service: Orthopedics;  Laterality: Left;   UPPER GASTROINTESTINAL ENDOSCOPY     UPPER GI ENDOSCOPY     WISDOM TOOTH EXTRACTION     Social History   Occupational History   Not on file  Tobacco Use   Smoking status: Never    Passive exposure: Never   Smokeless tobacco: Never  Vaping Use   Vaping Use: Never used  Substance and Sexual Activity   Alcohol use: Yes    Comment: Occasionally   Drug use: Never   Sexual activity: Not on file

## 2022-04-05 ENCOUNTER — Encounter: Payer: Self-pay | Admitting: Orthopedic Surgery

## 2022-04-09 ENCOUNTER — Other Ambulatory Visit: Payer: Self-pay | Admitting: Medical

## 2022-04-09 DIAGNOSIS — N951 Menopausal and female climacteric states: Secondary | ICD-10-CM

## 2022-04-16 NOTE — Progress Notes (Deleted)
Office Visit Note  Patient: Kristi Decker             Date of Birth: [birth year]           MRN: [id number]             PCP: Jac Canavan, PA-C Referring: Jac Canavan, PA-C Visit Date: 04/17/2022   Subjective:  No chief complaint on file.   History of Present Illness: Kristi Decker is a 46 y.o. female here for follow up ***   Previous HPI    No Rheumatology ROS completed.   PMFS History:  Patient Active Problem List   Diagnosis Date Noted   Mixed hyperlipidemia 11/14/2021   COVID-19 virus infection 11/07/2021   Hypoxemia 11/07/2021   Acute hypoxemic respiratory failure (HCC) 11/07/2021   Hypokalemia 11/07/2021   Low back pain 10/04/2021   Environmental and seasonal allergies 08/26/2021   Seasonal allergic rhinitis 08/26/2021   High risk medication use 07/05/2021   Moderate persistent asthma without complication 09/17/2020   S/P total knee arthroplasty, left 08/17/2020   Inflammatory arthritis 10/10/2019   Perimenopausal disorder 11/28/2018   GAD (generalized anxiety disorder) 06/28/2018   Primary osteoarthritis of left knee 04/17/2018   Prediabetes 12/03/2017   Irritable bowel 03/19/2017   GERD without esophagitis 03/19/2017   Fibromyalgia 11/28/2016   Migraine without aura and without status migrainosus, not intractable 10/17/2016   Essential hypertension 10/17/2016   Anxiety and depression 03/28/2016    Past Medical History:  Diagnosis Date   Allergy    Anemia    on iron- past hx   Anxiety    Arthritis    left knee   Asthma    Depression    GERD (gastroesophageal reflux disease)    High cholesterol    Per patient   Hypertension    Neuromuscular disorder (HCC)    right arm   PTSD (post-traumatic stress disorder)    Per patient   Refusal of blood product     Family History  Problem Relation Age of Onset   Rheum arthritis Mother    Hypertension Mother    Depression Mother    Anxiety disorder Mother    Bipolar disorder Father     Mental illness Sister    Depression Sister    Congestive Heart Failure Maternal Grandfather    Congestive Heart Failure Maternal Uncle    Congestive Heart Failure Maternal Uncle    Colon cancer Neg Hx    Colon polyps Neg Hx    Esophageal cancer Neg Hx    Rectal cancer Neg Hx    Stomach cancer Neg Hx    Past Surgical History:  Procedure Laterality Date   COLONOSCOPY  09/2021   Dr. Tressia Danas   DILATION AND CURETTAGE OF UTERUS     2002   TOTAL KNEE ARTHROPLASTY Left 08/17/2020   Procedure: LEFT TOTAL KNEE ARTHROPLASTY;  Surgeon: Cammy Copa, MD;  Location: [facility] OR;  Service: Orthopedics;  Laterality: Left;   UPPER GASTROINTESTINAL ENDOSCOPY     UPPER GI ENDOSCOPY     WISDOM TOOTH EXTRACTION     Social History   Social History Narrative   Lives with husband.  Has 2 children with first husband, and step son.   In process of getting job with [employer], recent licensing.  Exercise - has gym membership, should exercise more, but not currently.   01/2022.   Immunization History  Administered Date(s) Administered   Influenza,inj,Quad PF,6+ Mos 02/24/2016, 02/08/2017, 02/22/2018,  02/14/2019, 02/04/2020, 01/17/2022   Influenza-Unspecified 04/21/2021   PFIZER(Purple Top)SARS-COV-2 Vaccination 08/24/2019, 09/14/2019   Pfizer Covid-19 Vaccine Bivalent Booster 29yrs & up 04/26/2020, 11/11/2020   Pneumococcal Polysaccharide-23 02/04/2020   Tdap 11/28/2018     Objective: Vital Signs: LMP 05/16/2019    Physical Exam   Musculoskeletal Exam: ***  CDAI Exam: CDAI Score: -- Patient Global: --; Provider Global: -- Swollen: --; Tender: -- Joint Exam 04/17/2022   No joint exam has been documented for this visit   There is currently no information documented on the homunculus. Go to the Rheumatology activity and complete the homunculus joint exam.  Investigation: No additional findings.  Imaging: No results found.  Recent Labs: Lab Results  Component Value  Date   WBC 5.5 01/11/2022   HGB 13.4 01/11/2022   PLT 405 (H) 01/11/2022   NA 138 01/11/2022   K 4.0 01/11/2022   CL 105 01/11/2022   CO2 24 01/11/2022   GLUCOSE 106 (H) 01/11/2022   BUN 18 01/11/2022   CREATININE 0.64 01/11/2022   BILITOT 0.4 01/11/2022   ALKPHOS 106 11/08/2021   AST 16 01/11/2022   ALT 23 01/11/2022   PROT 7.0 01/11/2022   ALBUMIN 3.6 11/08/2021   CALCIUM 9.5 01/11/2022    Speciality Comments: No specialty comments available.  Procedures:  No procedures performed Allergies: Paxlovid [nirmatrelvir-ritonavir]   Assessment / Plan:     Visit Diagnoses: No diagnosis found.  ***  Orders: No orders of the defined types were placed in this encounter.  No orders of the defined types were placed in this encounter.    Follow-Up Instructions: No follow-ups on file.   Fuller Plan, MD  Note - This record has been created using [employer].  Chart creation errors have been sought, but may not always  have been located. Such creation errors do not reflect on  the standard of medical care.

## 2022-04-17 ENCOUNTER — Ambulatory Visit: Payer: Commercial Managed Care - HMO | Admitting: Internal Medicine

## 2022-04-18 NOTE — Progress Notes (Unsigned)
Office Visit Note  Patient: Kristi Decker             Date of Birth: 1975/06/11           MRN: [id number]             PCP: Jac Canavan, PA-C Referring: Jac Canavan, PA-C Visit Date: 04/19/2022   Subjective:  No chief complaint on file.   History of Present Illness: Kristi Decker is a 46 y.o. 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 She had recent injections in her wrists for CTS but also having cervical radiculopathy. ***   Previous HPI 01/11/22 Kristi Decker is a 46 y.o. 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  She suffered from a COVID infection in June this caused significant dyspnea leading to a brief hospitalization for supplemental oxygen for her dyspnea.  She recovered completely most of the time felt like she had bronchitis symptoms.  Her husband also got sick with the same infection.  Most recently she was able to see orthopedic clinic with evaluation of her low back and then went for L4 epidural steroid injection with Dr. Alvester Morin earlier this month.  Additionally she decreased her work at [employer] which required prolonged standing throughout the shift.  Between these 2 factors she has had a very great improvement in her low back pain and radiating left leg pain.    Previous HPI 10/04/21 Kristi Decker is a 46 y.o. female here for follow up for seronegative RA on sulfasalazine 1000 mg BID and diclofenac 75 mg BID.  She had to temporarily interrupt medications for colonoscopy since her last visit and felt severe worsening of joint pains in all of her body pain within days of stopping the medications.  These are overall improved again since restarting.  She saw Dr. Cyndie Chime for low back epidural steroid injection no complications from this but has not noticed a significant improvement in low back symptoms.  She was recommended this may require trial of up to 2 or 3 shots to  determine efficacy.  She is trying to remain active doing stretches for her back and is working on weight reduction trying to decrease carbohydrates from the diet.  She was referred to allergy and asthma clinic for allergy testing.   Previous HPI 07/05/2021 Kristi Decker is a 46 y.o. 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 She still has a lot of joint pain bothering her especially in her back. She is frustrated by persistent of pain and stiffness limiting physical activities. She does feel there is ongoing benefit with sulfasalazine but not helping her back very much. She saw Dr. August Saucer for this problem about 2 weeks ago on 06/23/21 trying conservative treatment with tylenol 3 and robaxin but has not felt a good relief with this so far.   Previous HPI 11/16/20 Kristi Decker is a 46 y.o. female here for chronic polyarticular inflammatory joint pain.  She was previously diagnosed with seronegative rheumatoid arthritis since about 2020 with previous treatment on oral diclofenac and sulfasalazine with apparently a significant response to treatment.  She also has chronic degenerative arthritis of multiple areas including the back for which she is undergone ESI and her left knee that underwent total knee arthroplasty with Dr. August Saucer earlier this year with a very great improvement  in joint pain symptoms.  She discontinued her medication perioperatively and felt that her all over joint pains worsened very noticeably with treatment interruption.  She does also have history for suspected fibromyalgia contributing to generalized joint pains as well as associated symptoms of chronic fatigue and unrefreshing sleep, exertion intolerance worsening symptoms for a day longer afterwards, episodic all over pain that feels like having the flu, previous migraine headaches that have improved, and frequent feeling of cognitive impairment or difficulty.      No  Rheumatology ROS completed.   PMFS History:  Patient Active Problem List   Diagnosis Date Noted   Mixed hyperlipidemia 11/14/2021   COVID-19 virus infection 11/07/2021   Hypoxemia 11/07/2021   Acute hypoxemic respiratory failure (HCC) 11/07/2021   Hypokalemia 11/07/2021   Low back pain 10/04/2021   Environmental and seasonal allergies 08/26/2021   Seasonal allergic rhinitis 08/26/2021   High risk medication use 07/05/2021   Moderate persistent asthma without complication 09/17/2020   S/P total knee arthroplasty, left 08/17/2020   Inflammatory arthritis 10/10/2019   Perimenopausal disorder 11/28/2018   GAD (generalized anxiety disorder) 06/28/2018   Primary osteoarthritis of left knee 04/17/2018   Prediabetes 12/03/2017   Irritable bowel 03/19/2017   GERD without esophagitis 03/19/2017   Fibromyalgia 11/28/2016   Migraine without aura and without status migrainosus, not intractable 10/17/2016   Essential hypertension 10/17/2016   Anxiety and depression 03/28/2016    Past Medical History:  Diagnosis Date   Allergy    Anemia    on iron- past hx   Anxiety    Arthritis    left knee   Asthma    Depression    GERD (gastroesophageal reflux disease)    High cholesterol    Per patient   Hypertension    Neuromuscular disorder (HCC)    right arm   PTSD (post-traumatic stress disorder)    Per patient   Refusal of blood product     Family History  Problem Relation Age of Onset   Rheum arthritis Mother    Hypertension Mother    Depression Mother    Anxiety disorder Mother    Bipolar disorder Father    Mental illness Sister    Depression Sister    Congestive Heart Failure Maternal Grandfather    Congestive Heart Failure Maternal Uncle    Congestive Heart Failure Maternal Uncle    Colon cancer Neg Hx    Colon polyps Neg Hx    Esophageal cancer Neg Hx    Rectal cancer Neg Hx    Stomach cancer Neg Hx    Past Surgical History:  Procedure Laterality Date   COLONOSCOPY   09/2021   Dr. Tressia Danas   DILATION AND CURETTAGE OF UTERUS     2002   TOTAL KNEE ARTHROPLASTY Left 08/17/2020   Procedure: LEFT TOTAL KNEE ARTHROPLASTY;  Surgeon: Cammy Copa, MD;  Location: [facility] OR;  Service: Orthopedics;  Laterality: Left;   UPPER GASTROINTESTINAL ENDOSCOPY     UPPER GI ENDOSCOPY     WISDOM TOOTH EXTRACTION     Social History   Social History Narrative   Lives with husband.  Has 2 children with first husband, and step son.   In process of getting job with [employer] insurance, recent licensing.  Exercise - has gym membership, should exercise more, but not currently.   01/2022.   Immunization History  Administered Date(s) Administered   Influenza,inj,Quad PF,6+ Mos 02/24/2016, 02/08/2017, 02/22/2018, 02/14/2019, 02/04/2020, 01/17/2022   Influenza-Unspecified 04/21/2021  PFIZER(Purple Top)SARS-COV-2 Vaccination 08/24/2019, 09/14/2019   Pfizer Covid-19 Vaccine Bivalent Booster 55yrs & up 04/26/2020, 11/11/2020   Pneumococcal Polysaccharide-23 02/04/2020   Tdap 11/28/2018     Objective: Vital Signs: LMP 05/16/2019    Physical Exam   Musculoskeletal Exam: ***  CDAI Exam: CDAI Score: -- Patient Global: --; Provider Global: -- Swollen: --; Tender: -- Joint Exam 04/19/2022   No joint exam has been documented for this visit   There is currently no information documented on the homunculus. Go to the Rheumatology activity and complete the homunculus joint exam.  Investigation: No additional findings.  Imaging: No results found.  Recent Labs: Lab Results  Component Value Date   WBC 5.5 01/11/2022   HGB 13.4 01/11/2022   PLT 405 (H) 01/11/2022   NA 138 01/11/2022   K 4.0 01/11/2022   CL 105 01/11/2022   CO2 24 01/11/2022   GLUCOSE 106 (H) 01/11/2022   BUN 18 01/11/2022   CREATININE 0.64 01/11/2022   BILITOT 0.4 01/11/2022   ALKPHOS 106 11/08/2021   AST 16 01/11/2022   ALT 23 01/11/2022   PROT 7.0 01/11/2022   ALBUMIN 3.6 11/08/2021    CALCIUM 9.5 01/11/2022    Speciality Comments: No specialty comments available.  Procedures:  No procedures performed Allergies: Paxlovid [nirmatrelvir-ritonavir]   Assessment / Plan:     Visit Diagnoses: No diagnosis found.  ***  Orders: No orders of the defined types were placed in this encounter.  No orders of the defined types were placed in this encounter.    Follow-Up Instructions: No follow-ups on file.   Fuller Plan, MD  Note - This record has been created using [employer].  Chart creation errors have been sought, but may not always  have been located. Such creation errors do not reflect on  the standard of medical care.

## 2022-04-19 ENCOUNTER — Ambulatory Visit: Payer: Commercial Managed Care - HMO | Attending: Internal Medicine | Admitting: Internal Medicine

## 2022-04-19 ENCOUNTER — Encounter: Payer: Self-pay | Admitting: Internal Medicine

## 2022-04-19 VITALS — BP 120/79 | HR 89 | Resp 16 | Ht 67.0 in | Wt 218.0 lb

## 2022-04-19 DIAGNOSIS — Z79899 Other long term (current) drug therapy: Secondary | ICD-10-CM

## 2022-04-19 DIAGNOSIS — M1990 Unspecified osteoarthritis, unspecified site: Secondary | ICD-10-CM

## 2022-04-19 DIAGNOSIS — M0600 Rheumatoid arthritis without rheumatoid factor, unspecified site: Secondary | ICD-10-CM | POA: Diagnosis not present

## 2022-04-19 DIAGNOSIS — M797 Fibromyalgia: Secondary | ICD-10-CM | POA: Diagnosis not present

## 2022-04-19 DIAGNOSIS — G5603 Carpal tunnel syndrome, bilateral upper limbs: Secondary | ICD-10-CM | POA: Diagnosis not present

## 2022-04-19 MED ORDER — DICLOFENAC SODIUM 75 MG PO TBEC: 75.0000 mg | DELAYED_RELEASE_TABLET | Freq: Two times a day (BID) | ORAL | 2 refills | Status: DC | PRN

## 2022-04-20 LAB — COMPLETE METABOLIC PANEL WITH GFR
AG Ratio: 1.5 (calc) (ref 1.0–2.5)
ALT: 25 U/L (ref 6–29)
AST: 20 U/L (ref 10–35)
Albumin: 4.4 g/dL (ref 3.6–5.1)
Alkaline phosphatase (APISO): 147 U/L — ABNORMAL HIGH (ref 31–125)
BUN: 14 mg/dL (ref 7–25)
CO2: 25 mmol/L (ref 20–32)
Calcium: 9.5 mg/dL (ref 8.6–10.2)
Chloride: 103 mmol/L (ref 98–110)
Creat: 0.6 mg/dL (ref 0.50–0.99)
Globulin: 3 g/dL (calc) (ref 1.9–3.7)
Glucose, Bld: 83 mg/dL (ref 65–99)
Potassium: 4.2 mmol/L (ref 3.5–5.3)
Sodium: 137 mmol/L (ref 135–146)
Total Bilirubin: 0.4 mg/dL (ref 0.2–1.2)
Total Protein: 7.4 g/dL (ref 6.1–8.1)
eGFR: 111 mL/min/{1.73_m2} (ref >=60)

## 2022-04-20 LAB — CBC WITH DIFFERENTIAL/PLATELET
Absolute Monocytes: 605 cells/uL (ref 200–950)
Basophils Absolute: 20 cells/uL (ref 0–200)
Basophils Relative: 0.3 %
Eosinophils Absolute: 170 cells/uL (ref 15–500)
Eosinophils Relative: 2.5 %
HCT: 41.5 % (ref 35.0–45.0)
Hemoglobin: 13.7 g/dL (ref 11.7–15.5)
Lymphs Abs: 2761 cells/uL (ref 850–3900)
MCH: 31.3 pg (ref 27.0–33.0)
MCHC: 33 g/dL (ref 32.0–36.0)
MCV: 94.7 fL (ref 80.0–100.0)
MPV: 9.4 fL (ref 7.5–12.5)
Monocytes Relative: 8.9 %
Neutro Abs: 3244 cells/uL (ref 1500–7800)
Neutrophils Relative %: 47.7 %
Platelets: 357 10*3/uL (ref 140–400)
RBC: 4.38 10*6/uL (ref 3.80–5.10)
RDW: 13 % (ref 11.0–15.0)
Total Lymphocyte: 40.6 %
WBC: 6.8 10*3/uL (ref 3.8–10.8)

## 2022-04-20 LAB — C-REACTIVE PROTEIN: CRP: 3.9 mg/L (ref <8.0)

## 2022-04-20 NOTE — Progress Notes (Signed)
Lab results look fine for continuing the sulfasalazine and diclofenac with no new changes.

## 2022-05-07 ENCOUNTER — Telehealth: Payer: Commercial Managed Care - HMO | Admitting: Nurse Practitioner

## 2022-05-07 ENCOUNTER — Encounter: Payer: Self-pay | Admitting: Nurse Practitioner

## 2022-05-07 DIAGNOSIS — J069 Acute upper respiratory infection, unspecified: Secondary | ICD-10-CM

## 2022-05-07 MED ORDER — PREDNISONE 20 MG PO TABS: 20.0000 mg | ORAL_TABLET | Freq: Every day | ORAL | 0 refills | Status: DC

## 2022-05-07 MED ORDER — FLUTICASONE PROPIONATE 50 MCG/ACT NA SUSP: 2.0000 | Freq: Every day | NASAL | 6 refills | Status: AC

## 2022-05-07 MED ORDER — PREDNISONE 20 MG PO TABS: 20.0000 mg | ORAL_TABLET | Freq: Every day | ORAL | 0 refills | Status: AC

## 2022-05-07 MED ORDER — PSEUDOEPH-BROMPHEN-DM 30-2-10 MG/5ML PO SYRP: 5.0000 mL | ORAL_SOLUTION | Freq: Four times a day (QID) | ORAL | 0 refills | Status: DC | PRN

## 2022-05-07 NOTE — Progress Notes (Signed)
Providers prescribe antibiotics to treat infections caused by bacteria. Antibiotics are very powerful in treating bacterial infections when they are used properly. To maintain their effectiveness, they should be used only when necessary. Overuse of antibiotics has resulted in the development of superbugs that are resistant to treatment!    After careful review of your answers, I would not recommend an antibiotic for your condition.  Antibiotics are not effective against viruses and therefore should not be used to treat them. Common examples of infections caused by viruses include colds and flu  Please feel free to reach back out to [place] or your PCP in 4-5 days if you are not feeling any better    E-Visit for Upper Respiratory Infection   We are sorry you are not feeling well.  Here is how we plan to help!  Based on what you have shared with me, it looks like you may have a viral upper respiratory infection.  Upper respiratory infections are caused by a large number of viruses; however, rhinovirus is the most common cause.   Symptoms vary from person to person, with common symptoms including sore throat, cough, fatigue or lack of energy and feeling of general discomfort.  A low-grade fever of up to 100.4 may present, but is often uncommon.  Symptoms vary however, and are closely related to a person's age or underlying illnesses.  The most common symptoms associated with an upper respiratory infection are nasal discharge or congestion, cough, sneezing, headache and pressure in the ears and face.  These symptoms usually persist for about 3 to 10 days, but can last up to 2 weeks.  It is important to know that upper respiratory infections do not cause serious illness or complications in most cases.    Upper respiratory infections can be transmitted from person to person, with the most common method of transmission being a person's hands.  The virus is able to live on the skin and can infect other persons  for up to 2 hours after direct contact.  Also, these can be transmitted when someone coughs or sneezes; thus, it is important to cover the mouth to reduce this risk.  To keep the spread of the illness at [place], good hand hygiene is very important.  This is an infection that is most likely caused by a virus. There are no specific treatments other than to help you with the symptoms until the infection runs its course.  We are sorry you are not feeling well.  Here is how we plan to help!   For nasal congestion, you may use an oral decongestants such as Mucinex D or if you have glaucoma or high blood pressure use plain Mucinex.  Saline nasal spray or nasal drops can help and can safely be used as often as needed for congestion.  For your congestion, I have prescribed Fluticasone nasal spray one spray in each nostril twice a day  If you do not have a history of heart disease, hypertension, diabetes or thyroid disease, prostate/bladder issues or glaucoma, you may also use Sudafed to treat nasal congestion.  It is highly recommended that you consult with a pharmacist or your primary care physician to ensure this medication is safe for you to take.     If you have a cough, you may use cough suppressants such as Delsym and Robitussin.  If you have glaucoma or high blood pressure, you can also use Coricidin HBP.   For cough I have prescribed for you A prescription  cough syrup.   If you have a sore or scratchy throat, use a saltwater gargle-  to  teaspoon of salt dissolved in a 4-ounce to 8-ounce glass of warm water.  Gargle the solution for approximately 15-30 seconds and then spit.  It is important not to swallow the solution.  You can also use throat lozenges/cough drops and Chloraseptic spray to help with throat pain or discomfort.  Warm or cold liquids can also be helpful in relieving throat pain.  For headache, pain or general discomfort, you can use Ibuprofen or Tylenol as directed.   Some authorities  believe that zinc sprays or the use of Echinacea may shorten the course of your symptoms.   HOME CARE Only take medications as instructed by your medical team. Be sure to drink plenty of fluids. Water is fine as well as fruit juices, sodas and electrolyte beverages. You may want to stay away from caffeine or alcohol. If you are nauseated, try taking small sips of liquids. How do you know if you are getting enough fluid? Your urine should be a pale yellow or almost colorless. Get rest. Taking a steamy shower or using a humidifier may help nasal congestion and ease sore throat pain. You can place a towel over your head and breathe in the steam from hot water coming from a faucet. Using a saline nasal spray works much the same way. Cough drops, hard candies and sore throat lozenges may ease your cough. Avoid close contacts especially the very young and the elderly Cover your mouth if you cough or sneeze Always remember to wash your hands.   GET HELP RIGHT AWAY IF: You develop worsening fever. If your symptoms do not improve within 10 days You develop yellow or green discharge from your nose over 3 days. You have coughing fits You develop a severe head ache or visual changes. You develop shortness of breath, difficulty breathing or start having chest pain Your symptoms persist after you have completed your treatment plan  MAKE SURE YOU  Understand these instructions. Will watch your condition. Will get help right away if you are not doing well or get worse.  Thank you for choosing an e-visit.  Your e-visit answers were reviewed by a board certified advanced clinical practitioner to complete your personal care plan. Depending upon the condition, your plan could have included both over the counter or prescription medications.  Please review your pharmacy choice. Make sure the pharmacy is open so you can pick up prescription now. If there is a problem, you may contact your provider through  [employer] and have the prescription routed to another pharmacy.  Your safety is important to [place]. If you have drug allergies check your prescription carefully.   For the next 24 hours you can use MyChart to ask questions about today's visit, request a non-urgent call back, or ask for a work or school excuse. You will get an email in the next two days asking about your experience. I hope that your e-visit has been valuable and will speed your recovery.

## 2022-05-07 NOTE — Patient Instructions (Signed)
[name], thank you for joining Claiborne Rigg, NP for today's virtual visit.  While this provider is not your primary care provider (PCP), if your PCP is located in our provider database this encounter information will be shared with them immediately following your visit.   A [place] MyChart account gives you access to today's visit and all your visits, tests, and labs performed at [facility] " click here if you don't have a [facility] MyChart account or go to [email or web address]  Consent: (Patient) Kristi Decker provided verbal consent for this virtual visit at the beginning of the encounter.  Current Medications:  Current Outpatient Medications:    predniSONE (DELTASONE) 20 MG tablet, Take 1 tablet (20 mg total) by mouth daily with breakfast for 5 days., Disp: 5 tablet, Rfl: 0   albuterol (PROVENTIL) (2.5 MG/3ML) 0.083% nebulizer solution, Take 3 mLs (2.5 mg total) by nebulization every 6 (six) hours as needed for wheezing or shortness of breath., Disp: 75 mL, Rfl: 5   albuterol (VENTOLIN HFA) 108 (90 Base) MCG/ACT inhaler, Inhale 2 puffs into the lungs every 6 (six) hours as needed for wheezing or shortness of breath., Disp: 18 g, Rfl: 5   atorvastatin (LIPITOR) 40 MG tablet, Take 1 tablet (40 mg total) by mouth daily., Disp: 30 tablet, Rfl: 11   benzonatate (TESSALON) 100 MG capsule, Take 1 capsule (100 mg total) by mouth 3 (three) times daily as needed for cough. (Patient not taking: Reported on 04/19/2022), Disp: 30 capsule, Rfl: 0   brompheniramine-pseudoephedrine-DM 30-2-10 MG/5ML syrup, Take 5 mLs by mouth 4 (four) times daily as needed., Disp: 240 mL, Rfl: 0   budesonide-formoterol (SYMBICORT) 160-4.5 MCG/ACT inhaler, INHALE 2 PUFFS INTO THE LUNGS TWO TIMES A DAY AS NEEDED FOR SHORTNESS OF BREATH/WHEEZING, Disp: 10.2 g, Rfl: 5   Calcium-Magnesium-Zinc (CAL-MAG-ZINC PO), Take 1 tablet by mouth daily., Disp: , Rfl:    Cholecalciferol (VITAMIN D) 50 MCG  (2000 UT) CAPS, Take 1 capsule (2,000 Units total) by mouth daily., Disp: 90 capsule, Rfl: 3   clonazePAM (KLONOPIN) 0.5 MG tablet, Take 0.5 mg by mouth 2 (two) times daily as needed for anxiety., Disp: , Rfl:    Cyanocobalamin (VITAMIN B-12 PO), Take 1 tablet by mouth daily., Disp: , Rfl:    diclofenac (VOLTAREN) 75 MG EC tablet, Take 1 tablet (75 mg total) by mouth 2 (two) times daily as needed., Disp: 60 tablet, Rfl: 2   ELDERBERRY PO, Take by mouth., Disp: , Rfl:    estradiol (ESTRACE) 0.5 MG tablet, Take 1 tablet (0.5 mg total) by mouth daily., Disp: 30 tablet, Rfl: 0   ferrous sulfate 325 (65 FE) MG tablet, Take 325 mg by mouth daily., Disp: , Rfl:    fluconazole (DIFLUCAN) 150 MG tablet, Take 1 tablet (150 mg total) by mouth every three (3) days as needed. (Patient not taking: Reported on 04/19/2022), Disp: 3 tablet, Rfl: 0   fluticasone (FLONASE) 50 MCG/ACT nasal spray, Place 2 sprays into both nostrils daily., Disp: 16 g, Rfl: 6   lamoTRIgine (LAMICTAL) 25 MG tablet, Take 75 mg by mouth daily., Disp: , Rfl:    lisinopril-hydrochlorothiazide (ZESTORETIC) 20-12.5 MG tablet, Take 1 tablet by mouth daily., Disp: 90 tablet, Rfl: 3   pantoprazole (PROTONIX) 40 MG tablet, Take 1 tablet (40 mg total) by mouth daily., Disp: 90 tablet, Rfl: 3   progesterone (PROMETRIUM) 100 MG capsule, TAKE 1 CAPSULE BY MOUTH AT BEDTIME, Disp: 30 capsule, Rfl: 1   sulfaSALAzine (AZULFIDINE) 500  MG tablet, TAKE 2 TABLETS BY MOUTH TWICE A DAY, Disp: 360 tablet, Rfl: 1   Medications ordered in this encounter:  Meds ordered this encounter  Medications   predniSONE (DELTASONE) 20 MG tablet    Sig: Take 1 tablet (20 mg total) by mouth daily with breakfast for 5 days.    Dispense:  5 tablet    Refill:  0    Order Specific Question:   Supervising Provider    Answer:   Merrilee Jansky [id number]     *If you need refills on other medications prior to your next appointment, please contact your  pharmacy*  Follow-Up: Call back or seek an in-person evaluation if the symptoms worsen or if the condition fails to improve as anticipated.  [facility] [phone]  Other Instructions INSTRUCTIONS: use a humidifier for nasal congestion Drink plenty of fluids, rest and wash hands frequently to avoid the spread of infection Alternate tylenol and Motrin for relief of fever    If you have been instructed to have an in-person evaluation today at a local Urgent Care facility, please use the link below. It will take you to a list of all of our available [facility], including address, phone number and hours of operation. Please do not delay care.  [facility]  If you or a family member do not have a primary care provider, use the link below to schedule a visit and establish care. When you choose a [facility] primary care physician or advanced practice provider, you gain a long-term partner in health. Find a Primary Care Provider  Learn more about [facility]'s in-office and virtual care options: [facility] - Get Care Now

## 2022-05-07 NOTE — Progress Notes (Signed)
I have spent 5 minutes in review of e-visit questionnaire, review and updating patient chart, medical decision making and response to patient.  ° °Kristi Decker W Constance Whittle, NP ° °  °

## 2022-05-07 NOTE — Progress Notes (Signed)
Virtual Visit Consent   Kristi Decker, you are scheduled for a virtual visit with a [facility] provider today. Just as with appointments in the office, your consent must be obtained to participate. Your consent will be active for this visit and any virtual visit you may have with one of our providers in the next 365 days. If you have a MyChart account, a copy of this consent can be sent to you electronically.  As this is a virtual visit, video technology does not allow for your provider to perform a traditional examination. This may limit your provider's ability to fully assess your condition. If your provider identifies any concerns that need to be evaluated in person or the need to arrange testing (such as labs, EKG, etc.), we will make arrangements to do so. Although advances in technology are sophisticated, we cannot ensure that it will always work on either your end or our end. If the connection with a video visit is poor, the visit may have to be switched to a telephone visit. With either a video or telephone visit, we are not always able to ensure that we have a secure connection.  By engaging in this virtual visit, you consent to the provision of healthcare and authorize for your insurance to be billed (if applicable) for the services provided during this visit. Depending on your insurance coverage, you may receive a charge related to this service.  I need to obtain your verbal consent now. Are you willing to proceed with your visit today? Kristi Decker has provided verbal consent on 05/07/2022 for a virtual visit (video or telephone). Claiborne Rigg, NP  Date: 05/07/2022 6:30 PM  Virtual Visit via Video Note   I, Claiborne Rigg, connected with  Kristi Decker  ([id number], 10-26-1975) on 05/07/22 at  6:30 PM EST by a video-enabled telemedicine application and verified that I am speaking with the correct person using two identifiers.  Location: Patient: Virtual Visit Location Patient:  Home Provider: Virtual Visit Location Provider: Home Office   I discussed the limitations of evaluation and management by telemedicine and the availability of in person appointments. The patient expressed understanding and agreed to proceed.    History of Present Illness: Kristi Decker is a 46 y.o. who identifies as a female who was assigned female at birth, and is being seen today for URI with cough and congestion.  Onset of the following symptoms was 3 days ago: cough, runny nose, sneezing, headache, sinus pressure, nasal congestion, PND. Cough is preventing her from sleeping at night. Denies fever.     Problems:  Patient Active Problem List   Diagnosis Date Noted   Bilateral carpal tunnel syndrome 04/19/2022   Mixed hyperlipidemia 11/14/2021   Hypoxemia 11/07/2021   Acute hypoxemic respiratory failure (HCC) 11/07/2021   Hypokalemia 11/07/2021   Low back pain 10/04/2021   Environmental and seasonal allergies 08/26/2021   Seasonal allergic rhinitis 08/26/2021   High risk medication use 07/05/2021   Moderate persistent asthma without complication 09/17/2020   S/P total knee arthroplasty, left 08/17/2020   Seronegative rheumatoid arthritis (HCC) 10/10/2019   GAD (generalized anxiety disorder) 06/28/2018   Primary osteoarthritis of left knee 04/17/2018   GERD without esophagitis 03/19/2017   Fibromyalgia 11/28/2016   Essential hypertension 10/17/2016   Anxiety and depression 03/28/2016    Allergies:  Allergies  Allergen Reactions   Paxlovid [Nirmatrelvir-Ritonavir]     intolerance   Medications:  Current Outpatient Medications:    predniSONE (DELTASONE) 20 MG tablet,  Take 1 tablet (20 mg total) by mouth daily with breakfast for 5 days., Disp: 5 tablet, Rfl: 0   albuterol (PROVENTIL) (2.5 MG/3ML) 0.083% nebulizer solution, Take 3 mLs (2.5 mg total) by nebulization every 6 (six) hours as needed for wheezing or shortness of breath., Disp: 75 mL, Rfl: 5   albuterol (VENTOLIN  HFA) 108 (90 Base) MCG/ACT inhaler, Inhale 2 puffs into the lungs every 6 (six) hours as needed for wheezing or shortness of breath., Disp: 18 g, Rfl: 5   atorvastatin (LIPITOR) 40 MG tablet, Take 1 tablet (40 mg total) by mouth daily., Disp: 30 tablet, Rfl: 11   benzonatate (TESSALON) 100 MG capsule, Take 1 capsule (100 mg total) by mouth 3 (three) times daily as needed for cough. (Patient not taking: Reported on 04/19/2022), Disp: 30 capsule, Rfl: 0   brompheniramine-pseudoephedrine-DM 30-2-10 MG/5ML syrup, Take 5 mLs by mouth 4 (four) times daily as needed., Disp: 240 mL, Rfl: 0   budesonide-formoterol (SYMBICORT) 160-4.5 MCG/ACT inhaler, INHALE 2 PUFFS INTO THE LUNGS TWO TIMES A DAY AS NEEDED FOR SHORTNESS OF BREATH/WHEEZING, Disp: 10.2 g, Rfl: 5   Calcium-Magnesium-Zinc (CAL-MAG-ZINC PO), Take 1 tablet by mouth daily., Disp: , Rfl:    Cholecalciferol (VITAMIN D) 50 MCG (2000 UT) CAPS, Take 1 capsule (2,000 Units total) by mouth daily., Disp: 90 capsule, Rfl: 3   clonazePAM (KLONOPIN) 0.5 MG tablet, Take 0.5 mg by mouth 2 (two) times daily as needed for anxiety., Disp: , Rfl:    Cyanocobalamin (VITAMIN B-12 PO), Take 1 tablet by mouth daily., Disp: , Rfl:    diclofenac (VOLTAREN) 75 MG EC tablet, Take 1 tablet (75 mg total) by mouth 2 (two) times daily as needed., Disp: 60 tablet, Rfl: 2   ELDERBERRY PO, Take by mouth., Disp: , Rfl:    estradiol (ESTRACE) 0.5 MG tablet, Take 1 tablet (0.5 mg total) by mouth daily., Disp: 30 tablet, Rfl: 0   ferrous sulfate 325 (65 FE) MG tablet, Take 325 mg by mouth daily., Disp: , Rfl:    fluconazole (DIFLUCAN) 150 MG tablet, Take 1 tablet (150 mg total) by mouth every three (3) days as needed. (Patient not taking: Reported on 04/19/2022), Disp: 3 tablet, Rfl: 0   fluticasone (FLONASE) 50 MCG/ACT nasal spray, Place 2 sprays into both nostrils daily., Disp: 16 g, Rfl: 6   lamoTRIgine (LAMICTAL) 25 MG tablet, Take 75 mg by mouth daily., Disp: , Rfl:     lisinopril-hydrochlorothiazide (ZESTORETIC) 20-12.5 MG tablet, Take 1 tablet by mouth daily., Disp: 90 tablet, Rfl: 3   pantoprazole (PROTONIX) 40 MG tablet, Take 1 tablet (40 mg total) by mouth daily., Disp: 90 tablet, Rfl: 3   progesterone (PROMETRIUM) 100 MG capsule, TAKE 1 CAPSULE BY MOUTH AT BEDTIME, Disp: 30 capsule, Rfl: 1   sulfaSALAzine (AZULFIDINE) 500 MG tablet, TAKE 2 TABLETS BY MOUTH TWICE A DAY, Disp: 360 tablet, Rfl: 1  Observations/Objective: Patient is well-developed, well-nourished in no acute distress.  Resting comfortably  at home.  Head is normocephalic, atraumatic.  No labored breathing.  Speech is clear and coherent with logical content.  Patient is alert and oriented at baseline.    Assessment and Plan: 1. URI with cough and congestion - predniSONE (DELTASONE) 20 MG tablet; Take 1 tablet (20 mg total) by mouth daily with breakfast for 5 days.  Dispense: 5 tablet; Refill: 0   Follow Up Instructions: I discussed the assessment and treatment plan with the patient. The patient was provided an opportunity to ask  questions and all were answered. The patient agreed with the plan and demonstrated an understanding of the instructions.  A copy of instructions were sent to the patient via MyChart unless otherwise noted below.    The patient was advised to call back or seek an in-person evaluation if the symptoms worsen or if the condition fails to improve as anticipated.  Time:  I spent 11 minutes with the patient via telehealth technology discussing the above problems/concerns.    Claiborne Rigg, NP

## 2022-05-09 ENCOUNTER — Encounter: Payer: Self-pay | Admitting: Medical

## 2022-05-09 ENCOUNTER — Telehealth (INDEPENDENT_AMBULATORY_CARE_PROVIDER_SITE_OTHER): Payer: Commercial Managed Care - HMO | Admitting: Medical

## 2022-05-09 VITALS — Ht 66.5 in | Wt 215.0 lb

## 2022-05-09 DIAGNOSIS — J0110 Acute frontal sinusitis, unspecified: Secondary | ICD-10-CM

## 2022-05-09 MED ORDER — AZITHROMYCIN 250 MG PO TABS: ORAL_TABLET | ORAL | 0 refills | Status: DC

## 2022-05-09 NOTE — Progress Notes (Signed)
Subjective:     Patient ID: Kristi Decker, female   DOB: 11-22-1975, 46 y.o.   MRN: [id number]  This visit type was conducted due to national recommendations for restrictions regarding the COVID-19 Pandemic (e.g. social distancing) in an effort to limit this patient's exposure and mitigate transmission in our community.  Due to their co-morbid illnesses, this patient is at least at moderate risk for complications without adequate follow up.  This format is felt to be most appropriate for this patient at this time.    Documentation for virtual audio and video telecommunications through [place] encounter:  The patient was located at home. The provider was located in the office. The patient did consent to this visit and is aware of possible charges through their insurance for this visit.  The other persons participating in this telemedicine service were none. Time spent on call was 20 minutes and in review of previous records 20 minutes total.  This virtual service is not related to other E/M service within previous 7 days.   HPI Chief Complaint  Patient presents with   other    Possible Sinus infection, symptoms since last week, glands very swollen, pressure in ears, head pressure, called in prednisone did not help at all, mucous has yellow green, and brown,    Virtual for sinus infection.   She notes sinus pressure, forehead sinus area hurts, hurts around eyes, glands very swollen, pressure in ears.   Started a week ago but doing worse now.  She did a virtual consult with telemedicine and they called out prednisone.  She feels like she needs an antibiotic at this point.  She has a few more days of prednisone left.  No fever, no NVD.  No shortness of breath or wheezing.  Her main significant concern is significant sinus pressure and forehead swollen glands.  No body aches or chills.  No sick contacts.  No other aggravating or relieving factors. No other complaint.   Past Medical  History:  Diagnosis Date   Allergy    Anemia    on iron- past hx   Anxiety    Arthritis    left knee   Asthma    Depression    GERD (gastroesophageal reflux disease)    High cholesterol    Per patient   Hypertension    Neuromuscular disorder (HCC)    right arm   PTSD (post-traumatic stress disorder)    Per patient   Refusal of blood product    Current Outpatient Medications on File Prior to Visit  Medication Sig Dispense Refill   albuterol (PROVENTIL) (2.5 MG/3ML) 0.083% nebulizer solution Take 3 mLs (2.5 mg total) by nebulization every 6 (six) hours as needed for wheezing or shortness of breath. 75 mL 5   albuterol (VENTOLIN HFA) 108 (90 Base) MCG/ACT inhaler Inhale 2 puffs into the lungs every 6 (six) hours as needed for wheezing or shortness of breath. 18 g 5   atorvastatin (LIPITOR) 40 MG tablet Take 1 tablet (40 mg total) by mouth daily. 30 tablet 11   budesonide-formoterol (SYMBICORT) 160-4.5 MCG/ACT inhaler INHALE 2 PUFFS INTO THE LUNGS TWO TIMES A DAY AS NEEDED FOR SHORTNESS OF BREATH/WHEEZING 10.2 g 5   Calcium-Magnesium-Zinc (CAL-MAG-ZINC PO) Take 1 tablet by mouth daily.     Cholecalciferol (VITAMIN D) 50 MCG (2000 UT) CAPS Take 1 capsule (2,000 Units total) by mouth daily. 90 capsule 3   clonazePAM (KLONOPIN) 0.5 MG tablet Take 0.5 mg by mouth 2 (two) times daily  as needed for anxiety.     Cyanocobalamin (VITAMIN B-12 PO) Take 1 tablet by mouth daily.     diclofenac (VOLTAREN) 75 MG EC tablet Take 1 tablet (75 mg total) by mouth 2 (two) times daily as needed. 60 tablet 2   ELDERBERRY PO Take by mouth.     estradiol (ESTRACE) 0.5 MG tablet Take 1 tablet (0.5 mg total) by mouth daily. 30 tablet 0   ferrous sulfate 325 (65 FE) MG tablet Take 325 mg by mouth daily.     fluticasone (FLONASE) 50 MCG/ACT nasal spray Place 2 sprays into both nostrils daily. 16 g 6   lamoTRIgine (LAMICTAL) 25 MG tablet Take 75 mg by mouth daily.     lisinopril-hydrochlorothiazide (ZESTORETIC)  20-12.5 MG tablet Take 1 tablet by mouth daily. 90 tablet 3   pantoprazole (PROTONIX) 40 MG tablet Take 1 tablet (40 mg total) by mouth daily. 90 tablet 3   predniSONE (DELTASONE) 20 MG tablet Take 1 tablet (20 mg total) by mouth daily with breakfast for 5 days. 5 tablet 0   progesterone (PROMETRIUM) 100 MG capsule TAKE 1 CAPSULE BY MOUTH AT BEDTIME 30 capsule 1   benzonatate (TESSALON) 100 MG capsule Take 1 capsule (100 mg total) by mouth 3 (three) times daily as needed for cough. (Patient not taking: Reported on 04/19/2022) 30 capsule 0   brompheniramine-pseudoephedrine-DM 30-2-10 MG/5ML syrup Take 5 mLs by mouth 4 (four) times daily as needed. (Patient not taking: Reported on 05/09/2022) 240 mL 0   fluconazole (DIFLUCAN) 150 MG tablet Take 1 tablet (150 mg total) by mouth every three (3) days as needed. (Patient not taking: Reported on 04/19/2022) 3 tablet 0   sulfaSALAzine (AZULFIDINE) 500 MG tablet TAKE 2 TABLETS BY MOUTH TWICE A DAY (Patient not taking: Reported on 05/09/2022) 360 tablet 1   No current facility-administered medications on file prior to visit.     Review of Systems As in subjective    Objective:   Physical Exam Due to coronavirus pandemic stay at home measures, patient visit was virtual and they were not examined in person.   Ht 5' 6.5" (1.689 m)   Wt 215 lb (97.5 kg)   LMP 05/16/2019   BMI 34.18 kg/m  Gen: somewhat ill appearing, a little upset Otherwise wd, wn, No labored breathing or witnessed wheezing     Assessment:     Encounter Diagnosis  Name Primary?   Acute non-recurrent frontal sinusitis Yes       Plan:     I recommend she do a COVID test to help rule that out.  If negative then begin Z-Pak antibiotic, continue Mucinex DM, nasal saline flush, rest, hydration.  If not much improved by then we call back.  I reviewed her recent telemedicine visit.  She has been treated for sinus infection a few times in the last 6 months.  If this trend recurs she  may end up needing to see ear nose and throat specialist or have a head/sinus scan   [name] was seen today for other.  Diagnoses and all orders for this visit:  Acute non-recurrent frontal sinusitis  Other orders -     azithromycin (ZITHROMAX) 250 MG tablet; 2 tablets day 1, then 1 tablet days 2-4    F/u prn

## 2022-06-02 ENCOUNTER — Ambulatory Visit (INDEPENDENT_AMBULATORY_CARE_PROVIDER_SITE_OTHER): Payer: 59 | Admitting: Medical

## 2022-06-02 ENCOUNTER — Encounter (HOSPITAL_BASED_OUTPATIENT_CLINIC_OR_DEPARTMENT_OTHER): Payer: Self-pay | Admitting: Medical

## 2022-06-02 VITALS — BP 126/87 | HR 95 | Ht 67.0 in | Wt 230.8 lb

## 2022-06-02 DIAGNOSIS — N951 Menopausal and female climacteric states: Secondary | ICD-10-CM

## 2022-06-02 MED ORDER — PROGESTERONE 200 MG PO CAPS: ORAL_CAPSULE | ORAL | 3 refills | Status: DC

## 2022-06-02 MED ORDER — ESTRADIOL 0.5 MG PO TABS: 1.0000 mg | ORAL_TABLET | Freq: Every day | ORAL | 3 refills | Status: DC

## 2022-06-02 NOTE — Progress Notes (Signed)
   History:  [name] is a 47 y.o. [id number] who presents to clinic today for hot flashes. She has been on 0.5 mg Estrace and 100 mg Prometrium daily x 1.5 years. Initially this worked well for her symptom management, but recently she has noticed increased hot flashes multiple times each day and at night. She has had mood swings and is crying more. She has also started a new job that is stressful and plans to follow-up with her psychiatrist soon as well.   The following portions of the patient's history were reviewed and updated as appropriate: allergies, current medications, family history, past medical history, social history, past surgical history and problem list.  Review of Systems:  Review of Systems  Constitutional:  Negative for chills, fever and malaise/fatigue.     Objective:  Physical Exam BP 126/87 (BP Location: Left Arm, Patient Position: Sitting, Cuff Size: Large)   Pulse 95   Ht 5\' 7"  (1.702 m) Comment: Reported  Wt 230 lb 12.8 oz (104.7 kg)   LMP 05/16/2019   BMI 36.15 kg/m  Physical Exam Constitutional:      Appearance: Normal appearance. She is obese. She is not ill-appearing.  HENT:     Head: Normocephalic.  Cardiovascular:     Rate and Rhythm: Normal rate.  Pulmonary:     Effort: Pulmonary effort is normal.  Abdominal:     General: Abdomen is flat. There is no distension.  Skin:    General: Skin is warm and dry.     Findings: No erythema.  Neurological:     Mental Status: She is alert and oriented to person, place, and time.  Psychiatric:        Mood and Affect: Mood normal.    Health Maintenance Due  Topic Date Due   COVID-19 Vaccine (5 - 2023-24 season) 01/13/2022    Labs, imaging and previous visits in Epic and Care Everywhere reviewed  Assessment & Plan:  1. Hot flashes due to menopause - progesterone (PROMETRIUM) 200 MG capsule; Take 1 tab (200 mg) daily on day 1-15 each month  Dispense: 30 capsule; Refill: 3 - estradiol (ESTRACE) 0.5  MG tablet; Take 2 tablets (1 mg total) by mouth daily.  Dispense: 30 tablet; Refill: 3 - Return for virtual office visit in 6 weeks with Dr. Sabra Heck  - Dicussed BP monitoring, Recheck normal today, follow-up with PCP  Luvenia Redden, PA-C 06/02/2022 9:28 AM

## 2022-06-05 ENCOUNTER — Encounter (HOSPITAL_BASED_OUTPATIENT_CLINIC_OR_DEPARTMENT_OTHER): Payer: Self-pay

## 2022-06-07 ENCOUNTER — Other Ambulatory Visit: Payer: Self-pay | Admitting: Internal Medicine

## 2022-06-07 DIAGNOSIS — M1380 Other specified arthritis, unspecified site: Secondary | ICD-10-CM

## 2022-06-07 DIAGNOSIS — M1990 Unspecified osteoarthritis, unspecified site: Secondary | ICD-10-CM

## 2022-06-07 NOTE — Telephone Encounter (Signed)
Next Visit: 07/19/2022  Last Visit: 04/19/2022  Last Fill: 02/06/2022  DX: Seronegative rheumatoid arthritis   Current Dose per office note 04/19/2022: sulfasalazine 1000 mg twice daily   Labs: 04/19/2022 Lab results look fine for continuing the sulfasalazine and diclofenac with no new changes.   Okay to refill SSZ?

## 2022-06-08 ENCOUNTER — Other Ambulatory Visit: Payer: Self-pay | Admitting: Medical

## 2022-06-08 DIAGNOSIS — N951 Menopausal and female climacteric states: Secondary | ICD-10-CM

## 2022-06-14 ENCOUNTER — Other Ambulatory Visit: Payer: Self-pay | Admitting: Internal Medicine

## 2022-06-14 DIAGNOSIS — M1990 Unspecified osteoarthritis, unspecified site: Secondary | ICD-10-CM

## 2022-06-20 ENCOUNTER — Other Ambulatory Visit: Payer: Self-pay | Admitting: Medical

## 2022-06-20 DIAGNOSIS — N951 Menopausal and female climacteric states: Secondary | ICD-10-CM

## 2022-06-20 NOTE — Telephone Encounter (Signed)
This was increased by obgyn.

## 2022-06-21 ENCOUNTER — Ambulatory Visit (INDEPENDENT_AMBULATORY_CARE_PROVIDER_SITE_OTHER): Payer: 59 | Admitting: Medical

## 2022-06-21 VITALS — BP 120/70 | HR 92 | Wt 233.6 lb

## 2022-06-21 DIAGNOSIS — M0600 Rheumatoid arthritis without rheumatoid factor, unspecified site: Secondary | ICD-10-CM | POA: Diagnosis not present

## 2022-06-21 DIAGNOSIS — R21 Rash and other nonspecific skin eruption: Secondary | ICD-10-CM

## 2022-06-21 DIAGNOSIS — M7989 Other specified soft tissue disorders: Secondary | ICD-10-CM

## 2022-06-21 DIAGNOSIS — Z79899 Other long term (current) drug therapy: Secondary | ICD-10-CM

## 2022-06-21 DIAGNOSIS — R234 Changes in skin texture: Secondary | ICD-10-CM

## 2022-06-21 NOTE — Progress Notes (Signed)
Subjective:  Kristi Decker is a 47 y.o. female who presents for Chief Complaint  Patient presents with   wants referral    Wants referral for hands- itchy, red, patches of rash on hands, spots on leg, arm had this for a couple weeks     Here for some itchy red patches of both hands for a few months.   Has a small rash on left forearm.   Has a few round dry spots on right upper thigh for months.  She has tried various treatments topically and nothing seems to help.  She does not do well with oral prednisone.  Purell, other hand creams, moisturizing creams, none seem to help.  She sees rheumatology for seronegative rheumatoid arthritis and is on medications for that currently.  She has no history of psoriasis or other chronic skin disease.  No fever, no other rash.  no alcohol use.  No other aggravating or relieving factors.    No other c/o.  Past Medical History:  Diagnosis Date   Allergy    Anemia    on iron- past hx   Anxiety    Arthritis    left knee   Asthma    Depression    GERD (gastroesophageal reflux disease)    High cholesterol    Per patient   Hypertension    Neuromuscular disorder ([place])    right arm   PTSD (post-traumatic stress disorder)    Per patient   Refusal of blood product    Current Outpatient Medications on File Prior to Visit  Medication Sig Dispense Refill   albuterol (PROVENTIL) (2.5 MG/3ML) 0.083% nebulizer solution Take 3 mLs (2.5 mg total) by nebulization every 6 (six) hours as needed for wheezing or shortness of breath. 75 mL 5   albuterol (VENTOLIN HFA) 108 (90 Base) MCG/ACT inhaler Inhale 2 puffs into the lungs every 6 (six) hours as needed for wheezing or shortness of breath. 18 g 5   atorvastatin (LIPITOR) 40 MG tablet Take 1 tablet (40 mg total) by mouth daily. 30 tablet 11   budesonide-formoterol (SYMBICORT) 160-4.5 MCG/ACT inhaler INHALE 2 PUFFS INTO THE LUNGS TWO TIMES A DAY AS NEEDED FOR SHORTNESS OF BREATH/WHEEZING 10.2 g 5    Calcium-Magnesium-Zinc (CAL-MAG-ZINC PO) Take 1 tablet by mouth daily.     Cholecalciferol (VITAMIN D) 50 MCG (2000 UT) CAPS Take 1 capsule (2,000 Units total) by mouth daily. 90 capsule 3   clonazePAM (KLONOPIN) 0.5 MG tablet Take 0.5 mg by mouth 2 (two) times daily as needed for anxiety.     Cyanocobalamin (VITAMIN B-12 PO) Take 1 tablet by mouth daily.     diclofenac (VOLTAREN) 75 MG EC tablet Take 1 tablet (75 mg total) by mouth 2 (two) times daily as needed. 60 tablet 2   ELDERBERRY PO Take by mouth.     estradiol (ESTRACE) 0.5 MG tablet Take 2 tablets (1 mg total) by mouth daily. 30 tablet 3   ferrous sulfate 325 (65 FE) MG tablet Take 325 mg by mouth daily.     fluticasone (FLONASE) 50 MCG/ACT nasal spray Place 2 sprays into both nostrils daily. 16 g 6   lamoTRIgine (LAMICTAL) 25 MG tablet Take 75 mg by mouth daily.     lisinopril-hydrochlorothiazide (ZESTORETIC) 20-12.5 MG tablet Take 1 tablet by mouth daily. 90 tablet 3   pantoprazole (PROTONIX) 40 MG tablet Take 1 tablet (40 mg total) by mouth daily. 90 tablet 3   progesterone (PROMETRIUM) 200 MG capsule Take 1 tab (  200 mg) daily on day 1-15 each month 30 capsule 3   sulfaSALAzine (AZULFIDINE) 500 MG tablet TAKE 2 TABLETS BY MOUTH TWICE A DAY 120 tablet 2   No current facility-administered medications on file prior to visit.   Family History  Problem Relation Age of Onset   Rheum arthritis Mother    Hypertension Mother    Depression Mother    Anxiety disorder Mother    Bipolar disorder Father    Mental illness Sister    Depression Sister    Congestive Heart Failure Maternal Grandfather    Congestive Heart Failure Maternal Uncle    Congestive Heart Failure Maternal Uncle    Colon cancer Neg Hx    Colon polyps Neg Hx    Esophageal cancer Neg Hx    Rectal cancer Neg Hx    Stomach cancer Neg Hx      The following portions of the patient's history were reviewed and updated as appropriate: allergies, current medications, past  family history, past medical history, past social history, past surgical history and problem list.  ROS Otherwise as in subjective above  Objective: BP 120/70   Pulse 92   Wt 233 lb 9.6 oz (106 kg)   LMP 05/16/2019   BMI 36.59 kg/m   General appearance: alert, no distress, well developed, well nourished Neck: supple, no lymphadenopathy, no thyromegaly, no masses Lungs: CTA bilaterally, no wheezes, rhonchi, or rales Several fingers on both hands at the base of the fingertip with localized swelling and erythema, several fingertips are cracking open on a.m., she does have acrylic nails in place, she has some obvious arthritic changes of the PIP and DIPs as well, no other swelling other than around fingertips He has neurovascularly intact   Assessment: Encounter Diagnoses  Name Primary?   Seronegative rheumatoid arthritis (HCC) Yes   High risk medication use    Rash    Cracked skin    Swollen finger      Plan: We discussed her findings and possible differential which could include autoimmune disease, skin reaction to nail glue or acrylic nails, fungal infection or other.  Discussed case with supervising physician who also examined her fingers, Dr. Redmond School.  We are leaning towards underlying rheumatoid issue.  She sees her rheumatoid doctor next week, Dr. Benjamine Mola and will discuss as well we will go ahead and place referral to dermatology as well.  In the meantime she can try the product over-the-counter cold gloves and the bottles that she is already failed other over-the-counter topical treatments.  Advise she for the time being quit using acrylic nails when these nails come off next week.  Kristi Decker was seen today for wants referral.  Diagnoses and all orders for this visit:  Seronegative rheumatoid arthritis ([place]) -     Ambulatory referral to Dermatology  High risk medication use -     Ambulatory referral to Dermatology  Rash -     Ambulatory referral to Dermatology  Cracked  skin -     Ambulatory referral to Dermatology  Swollen finger -     Ambulatory referral to Dermatology    Follow up: With specialist

## 2022-06-28 NOTE — Progress Notes (Unsigned)
Office Visit Note  Patient: Kristi Decker             Date of Birth: 1975-10-14           MRN: [id number]             PCP: Carlena Hurl, PA-C Referring: Carlena Hurl, PA-C Visit Date: 06/29/2022   Subjective:  Rash of the Right Hand, Rash of the Left Hand, and Follow-up (Patient states she had a rash on both of her hands. Patients states her fingers are itching and swollen.)   History of Present Illness: Kristi Decker is a 47 y.o. female here for follow up for seronegative RA on SSZ 1000 mg BID and diclofenac 75 mg BID PRN. Joint symptoms have been pretty stable since our last visit but she has worsening problems with itchy rashes on both hands and on her right thigh. She did not recall any preceding changes or events before this started. She has tried topical hydrocortisone without relief and using vaseline at night with improvement. She saw her PCP office about this who had concern for possible acrylic nail glue contact dermatitis versus autoimmune process such as psoriasis. They referred her to dermatology but new patient appointment is not until next year.   Previous HPI 04/19/22 Kristi Decker is a 47 y.o. 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 She felt the procedure was extremely painful with numbness and difficulty using her hands for the rest of the day. Right thumb remains partially numb at the tip. But her hand pain and numbness has otherwise improved with less episodes during the day and not experiencing the night time symptoms. Also suspected symptoms in part secondary to cervical radiculopathy problems and is having neck pain and soreness but less hand numbness.    Previous HPI 01/11/22 Kristi Decker is a 47 y.o. 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  She suffered from a COVID infection in June this caused  significant dyspnea leading to a brief hospitalization for supplemental oxygen for her dyspnea.  She recovered completely most of the time felt like she had bronchitis symptoms.  Her husband also got sick with the same infection.  Most recently she was able to see orthopedic clinic with evaluation of her low back and then went for L4 epidural steroid injection with Dr. Ernestina Patches earlier this month.  Additionally she decreased her work at [employer] which required prolonged standing throughout the shift.  Between these 2 factors she has had a very great improvement in her low back pain and radiating left leg pain.    Previous HPI 10/04/21 Kristi Decker is a 47 y.o. female here for follow up for seronegative RA on sulfasalazine 1000 mg BID and diclofenac 75 mg BID.  She had to temporarily interrupt medications for colonoscopy since her last visit and felt severe worsening of joint pains in all of her body pain within days of stopping the medications.  These are overall improved again since restarting.  She saw Dr. Alfonse Spruce for low back epidural steroid injection no complications from this but has not noticed a significant improvement in low back symptoms.  She was recommended this may require trial of up to 2 or 3 shots to determine efficacy.  She is trying to remain active doing stretches for her back and is working  on weight reduction trying to decrease carbohydrates from the diet.  She was referred to allergy and asthma clinic for allergy testing.   Previous HPI 07/05/2021 Kristi Decker is a 47 y.o. 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 She still has a lot of joint pain bothering her especially in her back. She is frustrated by persistent of pain and stiffness limiting physical activities. She does feel there is ongoing benefit with sulfasalazine but not helping her back very much. She saw Dr. Marlou Sa for this problem about 2 weeks ago on 06/23/21  trying conservative treatment with tylenol 3 and robaxin but has not felt a good relief with this so far.   Previous HPI 11/16/20 Kristi Decker is a 47 y.o. female here for chronic polyarticular inflammatory joint pain.  She was previously diagnosed with seronegative rheumatoid arthritis since about 2020 with previous treatment on oral diclofenac and sulfasalazine with apparently a significant response to treatment.  She also has chronic degenerative arthritis of multiple areas including the back for which she is undergone ESI and her left knee that underwent total knee arthroplasty with Dr. Marlou Sa earlier this year with a very great improvement in joint pain symptoms.  She discontinued her medication perioperatively and felt that her all over joint pains worsened very noticeably with treatment interruption.  She does also have history for suspected fibromyalgia contributing to generalized joint pains as well as associated symptoms of chronic fatigue and unrefreshing sleep, exertion intolerance worsening symptoms for a day longer afterwards, episodic all over pain that feels like having the flu, previous migraine headaches that have improved, and frequent feeling of cognitive impairment or difficulty.    Review of Systems  Constitutional:  Positive for fatigue.  HENT:  Positive for mouth dryness. Negative for mouth sores.   Eyes:  Negative for dryness.  Respiratory:  Negative for shortness of breath.   Cardiovascular:  Negative for chest pain and palpitations.  Gastrointestinal:  Positive for blood in stool. Negative for constipation and diarrhea.  Endocrine: Negative for increased urination.  Genitourinary:  Negative for involuntary urination.  Musculoskeletal:  Positive for joint pain, joint pain, myalgias, muscle tenderness and myalgias. Negative for gait problem, joint swelling, muscle weakness and morning stiffness.  Skin:  Positive for rash. Negative for color change, hair loss and sensitivity to  sunlight.  Allergic/Immunologic: Negative for susceptible to infections.  Neurological:  Negative for dizziness and headaches.  Hematological:  Negative for swollen glands.  Psychiatric/Behavioral:  Positive for depressed mood. Negative for sleep disturbance. The patient is not nervous/anxious.     PMFS History:  Patient Active Problem List   Diagnosis Date Noted   Rash of both hands 06/29/2022   Bilateral carpal tunnel syndrome 04/19/2022   Mixed hyperlipidemia 11/14/2021   Hypoxemia 11/07/2021   Acute hypoxemic respiratory failure ([place]) 11/07/2021   Hypokalemia 11/07/2021   Low back pain 10/04/2021   Environmental and seasonal allergies 08/26/2021   Seasonal allergic rhinitis 08/26/2021   High risk medication use 07/05/2021   Moderate persistent asthma without complication [id number]   S/P total knee arthroplasty, left 08/17/2020   Seronegative rheumatoid arthritis ([place]) 10/10/2019   GAD (generalized anxiety disorder) 06/28/2018   Primary osteoarthritis of left knee 04/17/2018   GERD without esophagitis 03/19/2017   Fibromyalgia 11/28/2016   Essential hypertension 10/17/2016   Anxiety and depression 03/28/2016    Past Medical History:  Diagnosis Date   Allergy    Anemia  on iron- past hx   Anxiety    Arthritis    left knee   Asthma    Depression    GERD (gastroesophageal reflux disease)    High cholesterol    Per patient   Hypertension    Neuromuscular disorder ([place])    right arm   PTSD (post-traumatic stress disorder)    Per patient   Refusal of blood product     Family History  Problem Relation Age of Onset   Rheum arthritis Mother    Hypertension Mother    Depression Mother    Anxiety disorder Mother    Bipolar disorder Father    Mental illness Sister    Depression Sister    Congestive Heart Failure Maternal Grandfather    Congestive Heart Failure Maternal Uncle    Congestive Heart Failure Maternal Uncle    Colon cancer Neg Hx    Colon polyps Neg  Hx    Esophageal cancer Neg Hx    Rectal cancer Neg Hx    Stomach cancer Neg Hx    Past Surgical History:  Procedure Laterality Date   COLONOSCOPY  09/2021   Dr. Thornton Park   DILATION AND CURETTAGE OF UTERUS     2002   TOTAL KNEE ARTHROPLASTY Left 08/17/2020   Procedure: LEFT TOTAL KNEE ARTHROPLASTY;  Surgeon: Meredith Pel, MD;  Location: [facility];  Service: Orthopedics;  Laterality: Left;   UPPER GASTROINTESTINAL ENDOSCOPY     UPPER GI ENDOSCOPY     WISDOM TOOTH EXTRACTION     Social History   Social History Narrative   Lives with husband.  Has 2 children with first husband, and step son.   In process of getting job with [employer] insurance, recent licensing.  Exercise - has gym membership, should exercise more, but not currently.   01/2022.   Immunization History  Administered Date(s) Administered   Influenza,inj,Quad PF,6+ Mos 02/24/2016, 02/08/2017, 02/22/2018, 02/14/2019, 02/04/2020, 01/17/2022   Influenza-Unspecified 04/21/2021   PFIZER(Purple Top)SARS-COV-2 Vaccination 08/24/2019, 09/14/2019   Pfizer Covid-19 Vaccine Bivalent Booster 14yr & up 04/26/2020, 11/11/2020   Pneumococcal Polysaccharide-23 02/04/2020   Tdap 11/28/2018     Objective: Vital Signs: BP 118/82 (BP Location: Left Arm, Patient Position: Sitting, Cuff Size: Large)   Pulse 87   Resp 16   Ht 5' 6.5" (1.689 m)   Wt 233 lb (105.7 kg)   LMP 05/16/2019   BMI 37.04 kg/m    Physical Exam Eyes:     Conjunctiva/sclera: Conjunctivae normal.  Cardiovascular:     Rate and Rhythm: Normal rate and regular rhythm.  Pulmonary:     Effort: Pulmonary effort is normal.     Breath sounds: Normal breath sounds.  Musculoskeletal:     Right lower leg: No edema.     Left lower leg: No edema.  Lymphadenopathy:     Cervical: No cervical adenopathy.  Skin:    General: Skin is warm and dry.     Findings: Rash present.     Comments: Erythematous rash involving fingers of both hands worse on right side  extending on dorsal side of MCP, including digital web between 2nd and 3rd fingers with some skin cracking, no scale present no pustules, no palmar involvement Wearing acrylic nails  Neurological:     Mental Status: She is alert.  Psychiatric:        Mood and Affect: Mood normal.      Musculoskeletal Exam:  Shoulders full ROM no tenderness or swelling Elbows full  ROM no tenderness or swelling Wrists full ROM no tenderness or swelling Fingers full ROM no tenderness or swelling No tenderness to lateral hip palpation Knees full ROM no tenderness or swelling   Investigation: No additional findings.  Imaging: No results found.  Recent Labs: Lab Results  Component Value Date   WBC 6.8 04/19/2022   HGB 13.7 04/19/2022   PLT 357 04/19/2022   NA 137 04/19/2022   K 4.2 04/19/2022   CL 103 04/19/2022   CO2 25 04/19/2022   GLUCOSE 83 04/19/2022   BUN 14 04/19/2022   CREATININE 0.60 04/19/2022   BILITOT 0.4 04/19/2022   ALKPHOS 106 11/08/2021   AST 20 04/19/2022   ALT 25 04/19/2022   PROT 7.4 04/19/2022   ALBUMIN 3.6 11/08/2021   CALCIUM 9.5 04/19/2022    Speciality Comments: No specialty comments available.  Procedures:  No procedures performed Allergies: Augmentin [amoxicillin-pot clavulanate] and Paxlovid [nirmatrelvir-ritonavir]   Assessment / Plan:     Visit Diagnoses: Seronegative rheumatoid arthritis ([place]) - Plan: Sedimentation rate  Joint palpation appears to be well-controlled no reported increased pain stiffness and no peripheral joint synovitis appreciated on exam today.  Will recheck sed rate for disease activity monitoring.  Plan to continue sulfasalazine 1000 mg twice daily and diclofenac 75 mg twice daily as needed.  High risk medication use - Plan: CBC with Differential/Platelet, COMPLETE METABOLIC PANEL WITH GFR  Checking CBC and CMP for medication monitoring on long-term use of sulfasalazine and diclofenac.  No significant interval infection.  Rash of  both hands - Plan: clobetasol cream (TEMOVATE) 0.05 %  Rash does not appear typical for psoriasis.  Dermatology referral is extremely far out we will plan to try some initial localized treatment in the meantime.  My initial suspicion possible contact dermatitis versus atopic dermatitis process.  Recommend trial initial treatment of topical clobetasol twice daily on the affected areas for at least 2 weeks and see if this improves.  Discussed importance for topical emollient or barrier where she is using Vaseline regularly at night already.  Could consider addition of a low-dose antihistamine medication for the itching as well if not improving.  Orders: Orders Placed This Encounter  Procedures   Sedimentation rate   CBC with Differential/Platelet   COMPLETE METABOLIC PANEL WITH GFR   Meds ordered this encounter  Medications   clobetasol cream (TEMOVATE) 0.05 %    Sig: Apply 1 Application topically 2 (two) times daily as needed.    Dispense:  30 g    Refill:  0     Follow-Up Instructions: No follow-ups on file.   Collier Salina, MD  Note - This record has been created using [employer].  Chart creation errors have been sought, but may not always  have been located. Such creation errors do not reflect on  the standard of medical care.

## 2022-06-29 ENCOUNTER — Ambulatory Visit: Payer: 59 | Attending: Internal Medicine | Admitting: Internal Medicine

## 2022-06-29 ENCOUNTER — Encounter: Payer: Self-pay | Admitting: Internal Medicine

## 2022-06-29 VITALS — BP 118/82 | HR 87 | Resp 16 | Ht 66.5 in | Wt 233.0 lb

## 2022-06-29 DIAGNOSIS — R21 Rash and other nonspecific skin eruption: Secondary | ICD-10-CM | POA: Insufficient documentation

## 2022-06-29 DIAGNOSIS — M0600 Rheumatoid arthritis without rheumatoid factor, unspecified site: Secondary | ICD-10-CM

## 2022-06-29 DIAGNOSIS — Z79899 Other long term (current) drug therapy: Secondary | ICD-10-CM | POA: Diagnosis not present

## 2022-06-29 MED ORDER — CLOBETASOL PROPIONATE 0.05 % EX CREA: 1.0000 | TOPICAL_CREAM | Freq: Two times a day (BID) | CUTANEOUS | 0 refills | Status: DC | PRN

## 2022-06-30 LAB — CBC WITH DIFFERENTIAL/PLATELET
Absolute Monocytes: 485 cells/uL (ref 200–950)
Basophils Absolute: 30 cells/uL (ref 0–200)
Basophils Relative: 0.6 %
Eosinophils Absolute: 240 cells/uL (ref 15–500)
Eosinophils Relative: 4.8 %
HCT: 41.1 % (ref 35.0–45.0)
Hemoglobin: 13.8 g/dL (ref 11.7–15.5)
Lymphs Abs: 2095 cells/uL (ref 850–3900)
MCH: 31.1 pg (ref 27.0–33.0)
MCHC: 33.6 g/dL (ref 32.0–36.0)
MCV: 92.6 fL (ref 80.0–100.0)
MPV: 9.2 fL (ref 7.5–12.5)
Monocytes Relative: 9.7 %
Neutro Abs: 2150 cells/uL (ref 1500–7800)
Neutrophils Relative %: 43 %
Platelets: 355 10*3/uL (ref 140–400)
RBC: 4.44 10*6/uL (ref 3.80–5.10)
RDW: 13.5 % (ref 11.0–15.0)
Total Lymphocyte: 41.9 %
WBC: 5 10*3/uL (ref 3.8–10.8)

## 2022-06-30 LAB — COMPLETE METABOLIC PANEL WITH GFR
AG Ratio: 1.4 (calc) (ref 1.0–2.5)
ALT: 13 U/L (ref 6–29)
AST: 14 U/L (ref 10–35)
Albumin: 4.2 g/dL (ref 3.6–5.1)
Alkaline phosphatase (APISO): 139 U/L — ABNORMAL HIGH (ref 31–125)
BUN: 17 mg/dL (ref 7–25)
CO2: 27 mmol/L (ref 20–32)
Calcium: 9.6 mg/dL (ref 8.6–10.2)
Chloride: 103 mmol/L (ref 98–110)
Creat: 0.5 mg/dL (ref 0.50–0.99)
Globulin: 3 g/dL (calc) (ref 1.9–3.7)
Glucose, Bld: 94 mg/dL (ref 65–99)
Potassium: 4.2 mmol/L (ref 3.5–5.3)
Sodium: 139 mmol/L (ref 135–146)
Total Bilirubin: 0.3 mg/dL (ref 0.2–1.2)
Total Protein: 7.2 g/dL (ref 6.1–8.1)
eGFR: 116 mL/min/{1.73_m2} (ref >=60)

## 2022-06-30 LAB — SEDIMENTATION RATE: Sed Rate: 9 mm/h (ref 0–20)

## 2022-07-03 NOTE — Progress Notes (Signed)
Lab results for blood count and metabolic panel look fine for continuing sulfasalazine and diclofenac without changes. Sedimentation rate is normal.

## 2022-07-07 ENCOUNTER — Encounter (HOSPITAL_BASED_OUTPATIENT_CLINIC_OR_DEPARTMENT_OTHER): Payer: Self-pay | Admitting: Obstetrics & Gynecology

## 2022-07-07 ENCOUNTER — Telehealth (HOSPITAL_BASED_OUTPATIENT_CLINIC_OR_DEPARTMENT_OTHER): Payer: 59 | Admitting: Obstetrics & Gynecology

## 2022-07-07 DIAGNOSIS — Z658 Other specified problems related to psychosocial circumstances: Secondary | ICD-10-CM

## 2022-07-07 DIAGNOSIS — N951 Menopausal and female climacteric states: Secondary | ICD-10-CM

## 2022-07-07 NOTE — Progress Notes (Signed)
Virtual Visit via Video Note  I connected with Kristi Decker on 07/07/22 at  8:35 AM EST by a video enabled telemedicine application and verified that I am speaking with the correct person using two identifiers.  Pt was scheduled for virtual visit which could not be completed due to connectivity issues.  Phone call was then made to pt and she desired to proceed with appt.  Location: Patient: work Provider: Pension scheme manager office   I discussed the limitations of evaluation and management by telemedicine and the availability of in person appointments. The patient expressed understanding and agreed to proceed.  History of Present Illness: 47 yo G3P2 female for follow up after having HRT adjusted with Kerry Hough, PA, on 06/02/2022.  Pt is having a lot of stressors in her life.  Recently sold home and it closes in March.  She and husband are buying an RV and moving to [place] to be closer to her mother who is aging and needs help.  It sad about moving away from children.  Has job she is very happy with but has been able to make contact with [facility] office where she will be and will be able to transition there.  Is taking estradiol 0.'5mg'$ , two tabs daily and progesterone '200mg'$  days 1-15 each month.  Not having bleeding  Continues to have hot flashes. Feels these are better but not gone.  When has a hot flash, feels this really affects her mood and emotion.  Is on lamotrigine '100mg'$ .  Feels this isn't helping that much either.  Does have provider who is helping with this.    Feels stressors of all that is going on is contributing as well.  We did discuss possible dosage adjustments for HRT.     Observations/Objective: N/a  Assessment and Plan: 1. Vasomotor symptoms due to menopause - pt is going to take '1mg'$  estradiol in am and 0.'5mg'$  estradiol in pm and continue her prometrium '200mg'$  days 1-15 each months.  Advised to give update in about 4 weeks.  2. Psychosocial stressors - pt is being followed by  mental health provider   Follow Up Instructions: I discussed the assessment and treatment plan with the patient. The patient was provided an opportunity to ask questions and all were answered. The patient agreed with the plan and demonstrated an understanding of the instructions.   The patient was advised to call back or seek an in-person evaluation if the symptoms worsen or if the condition fails to improve as anticipated.  I provided 22 minutes of non-face-to-face time during this encounter.   Megan Salon, MD

## 2022-07-19 ENCOUNTER — Ambulatory Visit: Payer: Commercial Managed Care - HMO | Admitting: Internal Medicine

## 2022-07-24 ENCOUNTER — Other Ambulatory Visit: Payer: Self-pay | Admitting: Physician Assistant

## 2022-08-05 ENCOUNTER — Other Ambulatory Visit: Payer: Self-pay | Admitting: Medical

## 2022-08-05 DIAGNOSIS — N951 Menopausal and female climacteric states: Secondary | ICD-10-CM

## 2022-08-28 ENCOUNTER — Telehealth: Payer: Self-pay

## 2022-08-28 ENCOUNTER — Other Ambulatory Visit: Payer: Self-pay

## 2022-08-28 DIAGNOSIS — I10 Essential (primary) hypertension: Secondary | ICD-10-CM

## 2022-08-28 DIAGNOSIS — N951 Menopausal and female climacteric states: Secondary | ICD-10-CM

## 2022-08-28 MED ORDER — LISINOPRIL-HYDROCHLOROTHIAZIDE 20-12.5 MG PO TABS: 1.0000 | ORAL_TABLET | Freq: Every day | ORAL | 0 refills | Status: DC

## 2022-08-28 MED ORDER — ESTRADIOL 0.5 MG PO TABS: 0.5000 mg | ORAL_TABLET | Freq: Two times a day (BID) | ORAL | 0 refills | Status: DC

## 2022-08-28 NOTE — Telephone Encounter (Signed)
Pt called and advised she hasn't been able to get an appt with a new doctor since she moved. She is asking for a 30 day supply of Lisinopril-hctz 12.5mg , and estradiol 0.5mg  as she has not found an OB yet either. Her pharmacy is [facility] located at [address] [place], [place], [place] [address] [phone].

## 2022-08-28 NOTE — Telephone Encounter (Signed)
Done

## 2022-09-19 ENCOUNTER — Telehealth: Payer: Self-pay | Admitting: Internal Medicine

## 2022-09-19 ENCOUNTER — Telehealth: Payer: Self-pay

## 2022-09-19 ENCOUNTER — Other Ambulatory Visit: Payer: Self-pay | Admitting: Internal Medicine

## 2022-09-19 DIAGNOSIS — M1990 Unspecified osteoarthritis, unspecified site: Secondary | ICD-10-CM

## 2022-09-19 NOTE — Telephone Encounter (Signed)
Contacted the patient to try to schedule a follow up appointment. Patient states she has moved to a different part of the state and will not be able to make it to appointments because she is too far away. Patient states she just changed insurance and has not had time to look for another doctor. Patient states she needs the medicine and advised patient the medicine was already sent in. Advised patient I would be letting Dr. Dimple Casey know of her plans. Please advise.

## 2022-09-19 NOTE — Telephone Encounter (Signed)
Patient called requesting prescription refills of Sulfasalazine and Diclofenac to be sent to NEW PHARMACY - [facility] at [address] in [place], [place].  Patient states she moved and has not established with a rheumatologist because she just got her new insurance.

## 2022-09-19 NOTE — Telephone Encounter (Signed)
Per prescription refill note, Please schedule patient a follow up visit. Patient due 09/27/2022. Thanks!

## 2022-09-19 NOTE — Telephone Encounter (Signed)
Please advise 

## 2022-09-19 NOTE — Telephone Encounter (Signed)
Patient would be due for follow up after 3 months so any time 5/15 or after.

## 2022-09-19 NOTE — Telephone Encounter (Signed)
See refill request note for details.  

## 2022-09-19 NOTE — Telephone Encounter (Signed)
Last Fill: 06/07/2022 Sulfasalazine 04/19/2022 Voltaren  Labs: 06/29/2022 Lab results for blood count and metabolic panel look fine for continuing sulfasalazine and diclofenac without changes. Sedimentation rate is normal.   Next Visit: not mentioned  Last Visit: 06/29/2022  DX: Seronegative rheumatoid arthritis   Current Dose per office note 06/29/2022: sulfasalazine 1000 mg twice daily and diclofenac 75 mg twice daily as needed.    Would you like a follow up scheduled?   Okay to refill Sulfasalazine and Voltaren?

## 2022-09-19 NOTE — Telephone Encounter (Signed)
Patient declined scheduling a follow-up appointment.  Patient states she moved to [facility] and just needed the prescriptions filled until she can get established with a new rheumatologist.

## 2022-09-20 MED ORDER — DICLOFENAC SODIUM 75 MG PO TBEC
75.0000 mg | DELAYED_RELEASE_TABLET | Freq: Two times a day (BID) | ORAL | 2 refills | Status: AC | PRN
Start: 2022-09-20 — End: ?

## 2022-09-20 MED ORDER — SULFASALAZINE 500 MG PO TABS
1000.0000 mg | ORAL_TABLET | Freq: Two times a day (BID) | ORAL | 2 refills | Status: AC
Start: 2022-09-20 — End: ?

## 2022-09-20 NOTE — Telephone Encounter (Signed)
Patient advised Sent Rx updated with 2 refills for 90 day supply to [facility]. No additional refills for this without reestablishing care here. Patient verbalized understanding.

## 2022-09-20 NOTE — Addendum Note (Signed)
Addended by: Fuller Plan on: 09/20/2022 08:34 AM   Modules accepted: Orders

## 2022-09-20 NOTE — Telephone Encounter (Signed)
Sent Rx updated with 2 refills for 90 day supply to [facility]. No additional refills for this without reestablishing care here.

## 2022-10-03 ENCOUNTER — Telehealth: Payer: Self-pay | Admitting: Internal Medicine

## 2022-10-03 ENCOUNTER — Other Ambulatory Visit: Payer: Self-pay | Admitting: *Deleted

## 2022-10-03 ENCOUNTER — Other Ambulatory Visit: Payer: Self-pay | Admitting: Medical

## 2022-10-03 DIAGNOSIS — R21 Rash and other nonspecific skin eruption: Secondary | ICD-10-CM

## 2022-10-03 DIAGNOSIS — N951 Menopausal and female climacteric states: Secondary | ICD-10-CM

## 2022-10-03 MED ORDER — CLOBETASOL PROPIONATE 0.05 % EX CREA
1.0000 | TOPICAL_CREAM | Freq: Two times a day (BID) | CUTANEOUS | 2 refills | Status: AC | PRN
Start: 2022-10-03 — End: ?

## 2022-10-03 NOTE — Telephone Encounter (Signed)
Pt is calling in to see if she can get a refill on her Rx: clobetasol cream (TEMOVATE) 0.05%.  Pharm: [facility] on [address] in [place], [place] [phone].  Pt would like to be called if it will be refilled.

## 2022-10-03 NOTE — Telephone Encounter (Signed)
Last Fill: 06/29/2022  Next Visit: No follow-ups on file.   Last Visit: 06/29/2022  Dx: Rash of both hands   Current Dose per office note on 06/29/2022: clobetasol cream (TEMOVATE) 0.05 %   Okay to refill clobetasol cream?

## 2022-10-03 NOTE — Telephone Encounter (Signed)
Is this okay to refill? 

## 2022-10-03 NOTE — Telephone Encounter (Signed)
Contacted the patient and advised Dr. Dimple Casey sent in clobetasol cream (TEMOVATE) 0.05% to [facility] on [address] in [place], [place] [phone]. Patient verbalized understanding.

## 2022-10-03 NOTE — Telephone Encounter (Signed)
RX sent to Dr. Dimple Casey for RF

## 2022-10-06 ENCOUNTER — Telehealth: Payer: Self-pay

## 2022-10-06 NOTE — Telephone Encounter (Signed)
P.A. PANTOPRAZOLE  

## 2022-10-06 NOTE — Telephone Encounter (Signed)
Pt. Called stating that her Pantoprazole needs a PA and she only has like 4-5 pills left, She wanted me to let you know so you could start working on that Tuesday when you came back asap.

## 2022-10-10 ENCOUNTER — Telehealth: Payer: Self-pay

## 2022-10-10 MED ORDER — PANTOPRAZOLE SODIUM 40 MG PO TBEC: 40.0000 mg | DELAYED_RELEASE_TABLET | Freq: Every day | ORAL | 1 refills | Status: DC

## 2022-10-10 NOTE — Telephone Encounter (Signed)
PA Case ID #: [id number] Rx #: [id number] Outcome Approved on May 24 Authorization Expiration Date: 10/06/2023 Drug Pantoprazole Sodium 40MG  dr tablets.   Called pt & informed.  She needs refills sent to [facility] [address] [place]

## 2022-10-10 NOTE — Telephone Encounter (Signed)
done

## 2022-10-10 NOTE — Telephone Encounter (Signed)
Pt called stating that she would like to have refill on her estradiol 0.5 mg and progesterone 200 mg for at least 30 days until she is seen by her other provider because she has moved.  If she could please get the refill as she moved to [place] [facility] on [address] at [phone]  [facility]  10/05/22

## 2022-10-11 ENCOUNTER — Other Ambulatory Visit (HOSPITAL_BASED_OUTPATIENT_CLINIC_OR_DEPARTMENT_OTHER): Payer: Self-pay | Admitting: Obstetrics & Gynecology

## 2022-10-11 DIAGNOSIS — N951 Menopausal and female climacteric states: Secondary | ICD-10-CM

## 2022-10-11 MED ORDER — ESTRADIOL 0.5 MG PO TABS
ORAL_TABLET | ORAL | 0 refills | Status: AC
Start: 2022-10-11 — End: ?

## 2022-10-11 MED ORDER — PROGESTERONE 200 MG PO CAPS: ORAL_CAPSULE | ORAL | 0 refills | Status: DC

## 2022-11-01 ENCOUNTER — Other Ambulatory Visit: Payer: Self-pay | Admitting: Medical

## 2022-11-01 DIAGNOSIS — N951 Menopausal and female climacteric states: Secondary | ICD-10-CM

## 2022-11-28 ENCOUNTER — Other Ambulatory Visit (HOSPITAL_BASED_OUTPATIENT_CLINIC_OR_DEPARTMENT_OTHER): Payer: Self-pay | Admitting: Obstetrics & Gynecology

## 2022-11-28 DIAGNOSIS — N951 Menopausal and female climacteric states: Secondary | ICD-10-CM

## 2023-01-20 ENCOUNTER — Other Ambulatory Visit: Payer: Self-pay | Admitting: Nurse Practitioner

## 2023-03-26 ENCOUNTER — Other Ambulatory Visit: Payer: Self-pay | Admitting: Medical

## 2023-03-26 DIAGNOSIS — I10 Essential (primary) hypertension: Secondary | ICD-10-CM

## 2023-03-26 NOTE — Telephone Encounter (Signed)
Pt needs an appt. Called, no answer, unable to leave voicemail.

## 2023-03-29 ENCOUNTER — Encounter: Payer: Self-pay | Admitting: Internal Medicine

## 2023-03-29 ENCOUNTER — Other Ambulatory Visit: Payer: Self-pay | Admitting: Medical

## 2023-06-27 ENCOUNTER — Ambulatory Visit: Payer: 59 | Admitting: Dermatology

## 2023-08-07 ENCOUNTER — Ambulatory Visit: Payer: 59 | Admitting: Dermatology

## 2023-08-08 ENCOUNTER — Other Ambulatory Visit: Payer: Self-pay | Admitting: Internal Medicine

## 2023-08-08 DIAGNOSIS — M1990 Unspecified osteoarthritis, unspecified site: Secondary | ICD-10-CM

## 2023-08-11 ENCOUNTER — Other Ambulatory Visit: Payer: Self-pay | Admitting: Internal Medicine

## 2023-08-11 DIAGNOSIS — M1990 Unspecified osteoarthritis, unspecified site: Secondary | ICD-10-CM
